# Patient Record
Sex: Male | Born: 1955 | Race: White | Hispanic: No | Smoking: Current some day smoker
Health system: Southern US, Community
[De-identification: ages and names within clinical notes are randomized; demographics above are authoritative.]

## PROBLEM LIST (undated history)

## (undated) DIAGNOSIS — D649 Anemia, unspecified: Secondary | ICD-10-CM

## (undated) DIAGNOSIS — F419 Anxiety disorder, unspecified: Secondary | ICD-10-CM

## (undated) DIAGNOSIS — D126 Benign neoplasm of colon, unspecified: Secondary | ICD-10-CM

## (undated) DIAGNOSIS — K56609 Unspecified intestinal obstruction, unspecified as to partial versus complete obstruction: Secondary | ICD-10-CM

## (undated) DIAGNOSIS — K5730 Diverticulosis of large intestine without perforation or abscess without bleeding: Secondary | ICD-10-CM

## (undated) DIAGNOSIS — K632 Fistula of intestine: Secondary | ICD-10-CM

## (undated) DIAGNOSIS — F329 Major depressive disorder, single episode, unspecified: Secondary | ICD-10-CM

## (undated) DIAGNOSIS — F32A Depression, unspecified: Secondary | ICD-10-CM

## (undated) DIAGNOSIS — L409 Psoriasis, unspecified: Secondary | ICD-10-CM

## (undated) DIAGNOSIS — M1990 Unspecified osteoarthritis, unspecified site: Secondary | ICD-10-CM

## (undated) DIAGNOSIS — K5080 Crohn's disease of both small and large intestine without complications: Principal | ICD-10-CM

## (undated) DIAGNOSIS — J449 Chronic obstructive pulmonary disease, unspecified: Secondary | ICD-10-CM

## (undated) DIAGNOSIS — K219 Gastro-esophageal reflux disease without esophagitis: Secondary | ICD-10-CM

## (undated) HISTORY — DX: Psoriasis, unspecified: L40.9

## (undated) HISTORY — DX: Anxiety disorder, unspecified: F41.9

## (undated) HISTORY — DX: Depression, unspecified: F32.A

## (undated) HISTORY — DX: Anemia, unspecified: D64.9

## (undated) HISTORY — DX: Chronic obstructive pulmonary disease, unspecified: J44.9

## (undated) HISTORY — DX: Crohn's disease of both small and large intestine without complications: K50.80

## (undated) HISTORY — PX: INCISE AND DRAIN ABCESS: PRO64

## (undated) HISTORY — PX: VASECTOMY: SHX75

## (undated) HISTORY — DX: Diverticulosis of large intestine without perforation or abscess without bleeding: K57.30

## (undated) HISTORY — DX: Major depressive disorder, single episode, unspecified: F32.9

## (undated) HISTORY — PX: COLONOSCOPY W/ BIOPSIES: SHX1374

## (undated) HISTORY — DX: Unspecified osteoarthritis, unspecified site: M19.90

## (undated) HISTORY — DX: Gastro-esophageal reflux disease without esophagitis: K21.9

## (undated) HISTORY — DX: Unspecified intestinal obstruction, unspecified as to partial versus complete obstruction: K56.609

## (undated) HISTORY — PX: OTHER SURGICAL HISTORY: SHX169

## (undated) HISTORY — DX: Fistula of intestine: K63.2

## (undated) HISTORY — PX: INGUINAL HERNIA REPAIR: SUR1180

---

## 1977-11-29 HISTORY — PX: APPENDECTOMY: SHX54

## 1998-08-07 ENCOUNTER — Ambulatory Visit (HOSPITAL_BASED_OUTPATIENT_CLINIC_OR_DEPARTMENT_OTHER): Admission: RE | Admit: 1998-08-07 | Discharge: 1998-08-07 | Payer: Self-pay | Admitting: Urology

## 2005-04-29 DIAGNOSIS — D126 Benign neoplasm of colon, unspecified: Secondary | ICD-10-CM

## 2005-04-29 HISTORY — DX: Benign neoplasm of colon, unspecified: D12.6

## 2005-05-10 ENCOUNTER — Ambulatory Visit: Payer: Self-pay | Admitting: Internal Medicine

## 2005-05-20 ENCOUNTER — Ambulatory Visit: Payer: Self-pay | Admitting: Internal Medicine

## 2005-05-20 ENCOUNTER — Encounter (INDEPENDENT_AMBULATORY_CARE_PROVIDER_SITE_OTHER): Payer: Self-pay | Admitting: *Deleted

## 2009-02-25 ENCOUNTER — Encounter: Payer: Self-pay | Admitting: *Deleted

## 2009-02-25 ENCOUNTER — Telehealth (INDEPENDENT_AMBULATORY_CARE_PROVIDER_SITE_OTHER): Payer: Self-pay | Admitting: *Deleted

## 2009-02-26 ENCOUNTER — Ambulatory Visit: Payer: Self-pay

## 2009-02-26 ENCOUNTER — Encounter: Payer: Self-pay | Admitting: Cardiology

## 2010-04-29 ENCOUNTER — Encounter (INDEPENDENT_AMBULATORY_CARE_PROVIDER_SITE_OTHER): Payer: Self-pay | Admitting: *Deleted

## 2010-12-31 NOTE — Letter (Signed)
Summary: Colonoscopy Letter  [facility]  [address] [place], [place] [address]   Phone: [phone]  Fax: [phone]      April 29, 2010 MRN: [id number]   Samuel Montgomery [address] [place], [place]  [address]   Dear Mr. Statzer,   According to your medical record, it is time for you to schedule a Colonoscopy. The American Cancer Society recommends this procedure as a method to detect early colon cancer. Patients with a family history of colon cancer, or a personal history of colon polyps or inflammatory bowel disease are at increased risk.  This letter has beeen generated based on the recommendations made at the time of your procedure. If you feel that in your particular situation this may no longer apply, please contact our office.  Please call our office at [phone] to schedule this appointment or to update your records at your earliest convenience.  Thank you for cooperating with [place] to provide you with the very best care possible.   Sincerely,  Iva Boop, M.D.  [facility] [phone]

## 2011-12-10 ENCOUNTER — Encounter: Payer: Self-pay | Admitting: *Deleted

## 2011-12-10 DIAGNOSIS — K219 Gastro-esophageal reflux disease without esophagitis: Secondary | ICD-10-CM

## 2011-12-10 DIAGNOSIS — L409 Psoriasis, unspecified: Secondary | ICD-10-CM | POA: Insufficient documentation

## 2011-12-20 ENCOUNTER — Telehealth: Payer: Self-pay | Admitting: Gastroenterology

## 2011-12-20 NOTE — Telephone Encounter (Signed)
Pt called in, he is schedule for his yearly physical on 01/04/12 and will call back to schedule colonoscopy after that appt

## 2011-12-20 NOTE — Telephone Encounter (Signed)
Noted  

## 2011-12-20 NOTE — Telephone Encounter (Signed)
Left a message on voicemail for the patient to call me back.

## 2012-01-04 ENCOUNTER — Ambulatory Visit (INDEPENDENT_AMBULATORY_CARE_PROVIDER_SITE_OTHER): Payer: BC Managed Care – PPO | Admitting: Emergency Medicine

## 2012-01-04 ENCOUNTER — Encounter: Payer: Self-pay | Admitting: Emergency Medicine

## 2012-01-04 ENCOUNTER — Ambulatory Visit: Payer: BC Managed Care – PPO

## 2012-01-04 VITALS — BP 117/69 | HR 62 | Temp 98.5°F | Resp 16 | Ht 64.0 in | Wt 157.8 lb

## 2012-01-04 DIAGNOSIS — R222 Localized swelling, mass and lump, trunk: Secondary | ICD-10-CM

## 2012-01-04 DIAGNOSIS — G2581 Restless legs syndrome: Secondary | ICD-10-CM

## 2012-01-04 DIAGNOSIS — K603 Anal fistula: Secondary | ICD-10-CM

## 2012-01-04 DIAGNOSIS — Z0000 Encounter for general adult medical examination without abnormal findings: Secondary | ICD-10-CM

## 2012-01-04 LAB — POCT URINALYSIS DIPSTICK
Bilirubin, UA: NEGATIVE
Glucose, UA: NEGATIVE
Leukocytes, UA: NEGATIVE
Nitrite, UA: NEGATIVE
pH, UA: 5.5

## 2012-01-04 LAB — IFOBT (OCCULT BLOOD): IFOBT: POSITIVE

## 2012-01-04 LAB — TSH: TSH: 0.945 u[IU]/mL (ref 0.350–4.500)

## 2012-01-04 NOTE — Progress Notes (Signed)
  Subjective:    Patient ID: Samuel Montgomery, male    DOB: 10-Oct-1956, 56 y.o.   MRN: [id number]  HPI patient enters for his yearly physical exam. He overall has been doing well he is currently working at the [employer] in [employer]. He continues to smoke at least a pack a day he continues to drink 3-4 drinks at night. He really is not interested in stopping his cigarettes. He also is not interested in cutting back on his alcohol intake.    Review of Systems he's been bothered with chronic cough and no other complaints      Objective:   Physical Exam HEENT exam was normal. Chest was clear to auscultation and percussion. Heart regular rate and rhythm without murmurs. 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 His prostate is normal to exam. 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  [facility] reading (PRIMARY) by  Dr.daub. Chest x-ray shows a prominent right hilar area. There is a suspicion of a mass in this area. EKG shows normal sinus rhythm       Assessment & Plan:  I am very concerned about the abnormal area seen on chest x-ray. We'll proceed with a CT with contrast to rule out a mass in this area. The patient is high risk due to his smoking history. 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 Patient is not concerning decreasing his alcohol use or cigarette use at this time.

## 2012-01-04 NOTE — Patient Instructions (Addendum)
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 Injections, How and Where to Give, Child - TO - How and Where to Give Insulin Injections, Child Intertrigo-Brief - TO - Intertrigo Jaw Dislocation (Mandibular Dislocation) - TO - Jaw Dislocation Knee Pain, General - TO - Knee Pain Knee Replacement - TO - Knee Replacement, Total Knee Replacement, Care After - TO - Knee Replacement, Total, Care After Laceration Care - TO - Laceration Care, Adult Laceration Care, Easy-to-Read - TO - Laceration Care, Adult, Easy-to-Read Laceration Care, Inside Mouth - TO - Mouth Laceration Laceration Care, Inside Mouth, Easy-to-Read - TO -  Mouth Laceration, Easy-to-Read LEEP (Loop Electrosurgical Excision Procedure) - TO - Loop Electrosurgical Excision Procedure  Lexiscan - TO - Lexiscan Stress Electrocardiography Ligamentous Sprain - TO - Ligament Sprain Marine - Coelenterate Stings - TO - Coelenterate Sting Marine - Sea Urchin Injuries - TO - Sea Urchin Injury Melanoma (Skin Cancer) - TO - Melanoma Menorrhagia (Heavy Periods) - TO - Menorrhagia Mesenteric Ischemia - TO - Acute Mesenteric Ischemia Mononucleosis, Infectious - TO - Infectious Mononucleosis Mononucleosis, Infectious, Easy-to-Read - TO - Infectious Mononucleosis, Easy-to-Read MRSA Infection in Pregnancy - TO - MRSA Infection During Pregnancy Myelogram, Care After, Easy-to-Read - TO - Myelography, Care After, Easy-to-Read Myelogram, Easy-to-Read - TO - Myelography, Easy-to-Read Myelogram, Neuro-Radiology - TO - Myelography Nasal Foreign Body-Brief - TO - Nasal Foreign Body, Easy-to-Read Nuclear Dobutamine Stress Test - TO - Dobutamine Stress Electrocardiographyew Title Oligohydraminos - TO - Oligohydramnios Organ Ultrasound - TO - Abdominal or Pelvic Ultrasound Organ Ultrasound, Easy-to-Read - TO - Abdominal or Pelvic Ultrasound, Easy-to-Read Pain Medications, Generic Instructions For - TO - Pain Medicine Instructions Pain Medications, Generic Instructions For, Easy-to-Read - TO - Pain Medicine Instructions, Easy-to-Read Patent Ductus Arteriosus (PDA) - TO - Patent Ductus Arteriosus Pediatric IV's - TO - Intravenous Catheter, Pediatric Phenylketonuria (PKU) - TO - Phenylketonuria Plasmapheresis, Blood Cleansing - TO - Plasmapheresis, Adult Polycystic Ovarian Syndrome (PCOS) - TO - Polycystic Ovarian Syndrome Polyps, Colon - TO - Colon Polyps Post-Concussion Syndrome, Adult - TO - Post-Concussion Syndrome Post-Concussion Syndrome-Brief - TO - Post-Concussion Syndrome, Easy-to-Read Postpartum Tubal Ligation (PPTL) - TO - Postpartum Tubal Ligation Precautions,  Airborne, Easy-to-Read - TO - Airborne Precautions, Easy-to-Read Precautions, Contact, Easy-to-Read - TO - Contact Precautions, Easy-to-Read Precautions, Droplet, Easy-to-Read - TO - Droplet Precautions, Easy-to-Read Pregnancy - Cytomegalovirus - TO - Cytomegalovirus During Pregnancy Pregnancy - Heartburn - TO - Heartburn During Pregnancy Pregnancy - Heartburn, Easy-to-Read - TO - Heartburn During Pregnancy, Easy-to-Read Premenstrual Syndrome (PMS) - TO - Premenstrual Syndrome   Proctalgia Fugax (Rectal Pain) - TO - Proctalgia Fugax Prolactin Levels PRL's - TO - Prolactin Levels Proteinuria, Protein in the Urine - TO - Proteinuria Pruritic Urticarial Papules and Plaques of Pregnancy (PUPPP) - TO - Pruritic Urticarial Papules and Plaques of Pregnancy Pseudofolliculitis Barbae (Razor Bumps) - TO - Pseudofolliculitis Barbae Psoriasis-Brief - TO - Psoriasis, Easy-to-Read PTSD, Post Traumatic Stress Disorder - TO - Post-Traumatic Stress Disorder Pulmonary Stress Testing - TO - Pulmonary Stress Test Radial Head Fractures, Easy-to-Read - TO - Radial Head Fracture, Easy-to-Read Repetitive Strain Injuries and Trauma Disorders - TO - Repetitive Strain Injuries [place] Fever (RVF) - TO - [place] Fever Sebaceous Cyst-Brief - TO - Epidermal Cyst, Easy-to-Read Sebaceous Cysts - TO - Epidermal Cyst Sepsis - TO - Sepsis, Adult Shingles (Herpes Zoster) - TO - Shingles Spider Bite-Brief - TO - Spider Bite Stress Echocardiogram - TO - Exercise Stress Echocardiography Stress Test, Cardiovascular - TO - Exercise Stress Electrocardiography Stroke, Hemorrhagic - TO - Hemorrhagic Stroke Sub Q Shot From a Bottle, Easy-to-Read - TO - Subcutaneous Injection Using a Syringe and Vial Sub Q Shot Syringe, Easy-to-Read - TO - Subcutaneous Injection Using a Syringe Tethered Spinal Cord Syndrome - TO - Tethered Cord Syndrome Ticks (Deer Tick Bites) - TO - Deer Tick Bite Ticks (Wood Tick Bites) - TO - Wood Tick  Bite Ticks (Wood Tick Bites), Easy-to-Read - TO - Wood Tick Bite, Easy-to-Read Toe Dislocation, Care After - TO - Toe Dislocation Toxocariasis, Roundworm Infection - TO - Toxocariasis Transesophageal Echocardiogram - TO - Transesophageal Echocardiography Vulva Biopsy, Easy-to-Read - TO - Vulva Biopsy, Care After, Easy-to-Read Why You Should Seek Dental Care - TO - Dental Care and Dentist Visits DEACTIVATED TITLES (77) Alcohol Intoxication-Brief ALT, Alanine Aminotransferase Anal Pruritus-Brief Anemia, Iron Deficiency-Brief Appendectomy, Child Appendectomy, Child, Care After Avoiding Insect Bites Biopsy, Discharge Instructions for Bruise, Easy-to-Read Cat Bite, Easy-to-Read Cold Therapy Cold, Common, Adult Cold, Common, Adult, Easy-to-Read Cold, Common, Child Cold, Common, Child, Easy-to-Read Colds and Flu, What to do Contraction Stress Testing Contusions Cough, Bacterial Cough, Generic Cough, Generic, Easy-to-Read Cough, Viral Cough-Brief Cumulative Trauma Disorder Dental Avulsion Diet - Low Cholesterol Guidelines Diet - Low-Fat, Low Saturated Fat, Low Cholesterol Dilation and Curettage, Diagnostic, Care After Dilation or Vacuum Curettage, Miscarriage or Retained Placenta, Care After Dog Bite Dog Bite, Easy-to-Read DT Immunization Dysmenorrhea-Brief Electrocardiography Test Electrocardiography-Brief Electroencephalography Test Electrophysiologic Study Epilepsy-Brief Fetal Contraction Stress Test Fetal Nonstress Test Food Allergies-Brief Hand, Foot, and Mouth Disease-Brief Hypertension in Pregnancy-Brief Hyponatremia-Brief Hysterectomy, Abdominal and Vaginal, Care After Insect Bite or Sting, Infected Insect Sting Allergies Intraoral Laceration-Brief Irukandji Sting Laceration, After Care Low Cholesterol, Low Fat Diet, Easy-to-Read Myelography Neutropenia From Chemotherapy Neutropenia, Causes of Neutropenic Precautions Nonhuman Primate Bite Pain Control  After Surgery, Easy-to-Read Pain, Taking Care of Your Pain at Home, Easy-to-Read Parotitis-Brief Postsurgical Instructions, General, Adult Pregnancy - Nonstress Testing Rectal Bleeding-Brief Repetitive Motion Disorders Safety, Easy-to-Read Scarlet Fever-Brief Shin Splints-Brief Sunburn-Brief Surgical Instructions, Generic Tailbone Injury-Brief Tetanus and Diphtheria Prevention-Brief Tetanus Prevention (Tetanus Toxoid) Tuberculosis-Brief Tularemia, FAQs Tul[place]mia, General Information, Bioterrorism Upper GI and Esophagus Studies Vertigo (Dizziness)-Brief Wound Recheck with Infection Document Released: 08/03/2011 Document Revised: 08/03/2011 Document Reviewed: 08/03/2011 ExitCare Patient Information 2012 [employer]. 

## 2012-01-05 LAB — COMPREHENSIVE METABOLIC PANEL
Alkaline Phosphatase: 89 U/L (ref 39–117)
CO2: 22 mEq/L (ref 19–32)
Creat: 0.91 mg/dL (ref 0.50–1.35)
Glucose, Bld: 76 mg/dL (ref 70–99)
Sodium: 139 mEq/L (ref 135–145)
Total Bilirubin: 0.6 mg/dL (ref 0.3–1.2)
Total Protein: 6.2 g/dL (ref 6.0–8.3)

## 2012-01-05 LAB — CBC
Hemoglobin: 15.1 g/dL (ref 13.0–17.0)
MCH: 30.3 pg (ref 26.0–34.0)
MCV: 91.4 fL (ref 78.0–100.0)
RBC: 4.99 MIL/uL (ref 4.22–5.81)
WBC: 10.9 10*3/uL — ABNORMAL HIGH (ref 4.0–10.5)

## 2012-01-05 LAB — LIPID PANEL
Cholesterol: 149 mg/dL (ref 0–200)
HDL: 55 mg/dL (ref >39)
Total CHOL/HDL Ratio: 2.7 Ratio
VLDL: 12 mg/dL (ref 0–40)

## 2012-01-05 LAB — TESTOSTERONE, FREE, TOTAL, SHBG
Sex Hormone Binding: 41 nmol/L (ref 13–71)
Testosterone, Free: 54.3 pg/mL (ref 47.0–244.0)
Testosterone-% Free: 1.7 % (ref 1.6–2.9)
Testosterone: 314.66 ng/dL (ref 250–890)

## 2012-01-17 ENCOUNTER — Other Ambulatory Visit: Payer: BC Managed Care – PPO

## 2012-01-31 ENCOUNTER — Encounter: Payer: Self-pay | Admitting: Internal Medicine

## 2012-03-10 ENCOUNTER — Encounter: Payer: Self-pay | Admitting: *Deleted

## 2012-03-14 ENCOUNTER — Encounter: Payer: Self-pay | Admitting: Internal Medicine

## 2012-03-14 ENCOUNTER — Ambulatory Visit (INDEPENDENT_AMBULATORY_CARE_PROVIDER_SITE_OTHER): Payer: BC Managed Care – PPO | Admitting: Internal Medicine

## 2012-03-14 VITALS — BP 124/70 | HR 68 | Ht 64.0 in | Wt 159.4 lb

## 2012-03-14 DIAGNOSIS — Z86010 Personal history of colonic polyps: Secondary | ICD-10-CM

## 2012-03-14 DIAGNOSIS — R933 Abnormal findings on diagnostic imaging of other parts of digestive tract: Secondary | ICD-10-CM

## 2012-03-14 DIAGNOSIS — R197 Diarrhea, unspecified: Secondary | ICD-10-CM

## 2012-03-14 DIAGNOSIS — K529 Noninfective gastroenteritis and colitis, unspecified: Secondary | ICD-10-CM

## 2012-03-14 DIAGNOSIS — Z1211 Encounter for screening for malignant neoplasm of colon: Secondary | ICD-10-CM

## 2012-03-14 DIAGNOSIS — K4020 Bilateral inguinal hernia, without obstruction or gangrene, not specified as recurrent: Secondary | ICD-10-CM

## 2012-03-14 MED ORDER — PEG-KCL-NACL-NASULF-NA ASC-C 100 G PO SOLR: 1.0000 | Freq: Once | ORAL | Status: DC

## 2012-03-14 MED ORDER — LOPERAMIDE HCL 2 MG PO CHEW: CHEWABLE_TABLET | ORAL | Status: DC

## 2012-03-14 NOTE — Progress Notes (Signed)
Subjective:    Patient ID: Samuel Montgomery, male    DOB: 1956-10-24, 56 y.o.   MRN: [id number]  Referred by: Collene Gobble, MD [address] [place], [place] [address]  HPI This 56 year old white man presents with complaints of diarrhea and concerns that he has an anal fistula. For a year or more he has had watery stools averaging 6-8 a day. Occasionally formed almost always watery. He is noted an opening which drained some clear fluid, in the anal area. He is used to me her for inspection. He can feel something there as well. He has urgent stools, and has had occasional fecal incontinence at home. He has not any bleeding. The anal and rectal area is tender and sore. He has not tried any over-the-counter medications to try to treat this diarrhea. He had a colonoscopy with adenomatous rectal polyp in 2006. There was a suspected polyp in the sigmoid but the pathology was consistent with postal prolapse. He also had diverticulosis. He has not yet returned for a followup routine colonoscopy.  He believes he has lost some weight he denies fever. He has not had nausea or vomiting.  He drinks 5-6 bourbon drinks nightly. He says this helps him sleep. He has been doing this for a number of years. 3-4 caffeinated beverages daily. Marijuana use approximately once a month.  Medications, allergies, past medical history, past surgical history, family history and social history are reviewed and updated in the EMR.  Review of Systems Chest x-ray has suggested right hilar fullness and a CT of the chest was recommended and ordered. The patient has not performed the chest CT and says he is tried to contact his primary care office about this. He believes that he has had this problem in the past. He was hoping for x-ray review. He does have psoriasis and patchy areas of itching and rash. All other review of systems negative or as per history of present illness.    Objective:   Physical Exam General:  Well-developed,  well-nourished and in no acute distress Eyes:  anicteric. ENT:   Mouth and posterior pharynx free of lesions.  Neck:   supple w/o thyromegaly or mass.  Lungs: Clear to auscultation bilaterally. Heart:  S1S2, no rubs, murmurs, gallops. Abdomen:  soft, non-tender, no hepatosplenomegaly or mass, + bilateral inguinal herniae, BS+.  Rectal: Pinpoint opening right perianal area with underlying induration. Nontender. Nothing expressedPerianal erythema also. No rectal mass, soft brown stool. Lymph:  no cervical or supraclavicular adenopathy. Extremities:   no edema Skin   scattered areas psoriatic plaques Neuro:  A&O x 3.  Psych:  appropriate mood and  Affect.   Data Reviewed:  iFOBT + 12/2011 Lab Results  Component Value Date   WBC 10.9* 01/04/2012   HGB 15.1 01/04/2012   HCT 45.6 01/04/2012   MCV 91.4 01/04/2012   PLT 307 01/04/2012     Chemistry      Component Value Date/Time   NA 139 01/04/2012 1230   K 4.2 01/04/2012 1230   CL 107 01/04/2012 1230   CO2 22 01/04/2012 1230   BUN 12 01/04/2012 1230   CREATININE 0.91 01/04/2012 1230      Component Value Date/Time   CALCIUM 8.6 01/04/2012 1230   ALKPHOS 89 01/04/2012 1230   AST 18 01/04/2012 1230   ALT 21 01/04/2012 1230   BILITOT 0.6 01/04/2012 1230            Assessment & Plan:   1. Chronic diarrhea  2. Special screening for malignant neoplasms, colon   3. Personal history of colonic polyps   4.  ? Anal fistula or perianal abscess  5. Bilateral inguinal hernia     His current problems are suggestive of inflammatory bowel disease. Risk factors for this include family history sister, she has Crohn's disease and he also has psoriasis which I believe increases the risk of inflammatory bowel disease also. Colonoscopy is appropriate to investigate this. He can use Imodium as needed for the diarrhea for the time being. I recommended he reduce the amount of alcohol he drinks as that may contribute to his diarrhea as well there think he may have a small  perianal abscess versus fistula. Further imaging may be necessary for that though await the colonoscopy results first.The risks and benefits as well as alternatives of endoscopic procedure(s) have been discussed and reviewed. All questions answered. The patient agrees to proceed.  I recommended he follow up with primary care regarding the abnormal chest x-ray as well I doubt that related to this issue.  Cc: Lesle Chris, MD

## 2012-03-14 NOTE — Patient Instructions (Addendum)
You have been given a separate informational sheet regarding your tobacco use, the importance of quitting and local resources to help you quit. Please reduce alcohol consumption to no more than 2 drinks a day You have been scheduled for a colonoscopy.Please follow written instructions given to you at your visit today.  Please pick up your prep kit at the pharmacy within the next 1-3 days. Please pick up Imodium AD, it is over the counter.  Take one tablet four times a day as needed for diarrhea.

## 2012-03-15 ENCOUNTER — Encounter: Payer: Self-pay | Admitting: Internal Medicine

## 2012-03-30 ENCOUNTER — Ambulatory Visit (AMBULATORY_SURGERY_CENTER): Payer: BC Managed Care – PPO | Admitting: Internal Medicine

## 2012-03-30 ENCOUNTER — Encounter: Payer: Self-pay | Admitting: Internal Medicine

## 2012-03-30 VITALS — BP 117/70 | HR 60 | Temp 96.8°F | Resp 21 | Ht 64.0 in | Wt 159.0 lb

## 2012-03-30 DIAGNOSIS — Z860100 Personal history of colon polyps, unspecified: Secondary | ICD-10-CM

## 2012-03-30 DIAGNOSIS — K633 Ulcer of intestine: Secondary | ICD-10-CM

## 2012-03-30 DIAGNOSIS — Z1211 Encounter for screening for malignant neoplasm of colon: Secondary | ICD-10-CM

## 2012-03-30 DIAGNOSIS — K5730 Diverticulosis of large intestine without perforation or abscess without bleeding: Secondary | ICD-10-CM

## 2012-03-30 MED ORDER — SODIUM CHLORIDE 0.9 % IV SOLN: 500.0000 mL | INTRAVENOUS | Status: DC

## 2012-03-30 NOTE — Patient Instructions (Addendum)

## 2012-03-30 NOTE — Progress Notes (Signed)
Patient did not experience any of the following events: a burn prior to discharge; a fall within the facility; wrong site/side/patient/procedure/implant event; or a hospital transfer or hospital admission upon discharge from the facility. (G8907) Patient did not have preoperative order for IV antibiotic SSI prophylaxis. (G8918)  

## 2012-03-30 NOTE — Progress Notes (Signed)
The pt tolerated the colonoscopy very well. Maw   

## 2012-03-30 NOTE — Op Note (Signed)
[facility] [address] [place], [place]  [address]  COLONOSCOPY PROCEDURE REPORT  PATIENT:  Samuel Montgomery, Samuel Montgomery  MR#:  [id number] BIRTHDATE:  Nov 19, 1956, 56 yrs. old  GENDER:  male ENDOSCOPIST:  Iva Boop, MD, [facility]  PROCEDURE DATE:  03/30/2012 PROCEDURE:  Colonoscopy with biopsy ASA CLASS:  Class II INDICATIONS:  Screening, history of pre-cancerous (adenomatous) colon polyps, unexplained diarrhea MEDICATIONS:   These medications were titrated to patient response per physician's verbal order, Fentanyl 50 mcg IV, Versed 6 mg IV  DESCRIPTION OF PROCEDURE:   After the risks benefits and alternatives of the procedure were thoroughly explained, informed consent was obtained.  Digital rectal exam was performed and revealed no rectal masses and normal prostate.  There was a firm perianal nodule as palpated previously (recent office visit), about 2 o'clock in left lateral position. The LB [id number] endoscope was introduced through the anus and advanced to the terminal ileum which was intubated for a short distance, without limitations.  The quality of the prep was excellent, using MoviPrep.  The instrument was then slowly withdrawn as the colon was fully examined. <<PROCEDUREIMAGES>>  FINDINGS:  A stricture was found terminal ileum Ulcerated and stenotic area, a few cm at most from ileocecal valve. Scope would not pass. Multiple biopsies were obtained and sent to pathology. Ulcers in terminal ileum Multiple biopsies were obtained and sent to pathology.  Ulcers throughout the colon. Aphthous ulcers seen scattered throughout the colon, more prominent in the sigmoid colon. Multiple biopsies were obtained and sent to pathology. Moderate diverticulosis was found in the sigmoid colon.  This was otherwise a normal examination of the colon.   Retroflexed views in the rectum revealed no abnormalities.    The time to cecum = 2:27 minutes. The scope was then withdrawn in  12:01 minutes from the cecum and the procedure completed. 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 My office will contact him to do by early next week. 2) Office visit next week so I can discuss diagnosis, further work-up (CT-enterography)  and treatment options. My office will call and arrange. 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 REPEAT EXAM:  In for Colonoscopy, pending biopsy results.  Iva Boop, MD, [facility]  CC:  Lesle Chris, MD and The Patient  n. eSIGNED:   Iva Boop at 03/30/2012 05:04 PM  Daphene Jaeger, [id number]

## 2012-03-31 ENCOUNTER — Telehealth: Payer: Self-pay

## 2012-03-31 ENCOUNTER — Telehealth: Payer: Self-pay | Admitting: *Deleted

## 2012-03-31 NOTE — Telephone Encounter (Signed)
Pt calling states he needs a work note from procedure yesterday. Pt asked for note for yesterday, procedure day and today as well. Told pt we can only give note for yesterday. Pt verbalized undersatnding. ewm

## 2012-03-31 NOTE — Telephone Encounter (Signed)
  Follow up Call-  Call back number 03/30/2012  Post procedure Call Back phone  # [phone]  Permission to leave phone message Yes     Patient questions:  Do you have a fever, pain , or abdominal swelling? no Pain Score  0 *  Have you tolerated food without any problems? yes  Have you been able to return to your normal activities? yes  Do you have any questions about your discharge instructions: Diet   no Medications  no Follow up visit  no  Do you have questions or concerns about your Care? no  Actions: * If pain score is 4 or above: No action needed, pain <4.

## 2012-04-04 NOTE — Progress Notes (Signed)
Quick Note:  Office let him know  Biopsies are consistent with Crohn's Needs labs and PPD as written in colonoscopy note and REV me this week to discuss further evaluation Please arrange these  LEC   No letter  No recall colonoscopy at this time ______

## 2012-04-05 ENCOUNTER — Ambulatory Visit (INDEPENDENT_AMBULATORY_CARE_PROVIDER_SITE_OTHER): Payer: BC Managed Care – PPO | Admitting: Internal Medicine

## 2012-04-05 ENCOUNTER — Other Ambulatory Visit: Payer: Self-pay

## 2012-04-05 ENCOUNTER — Other Ambulatory Visit (INDEPENDENT_AMBULATORY_CARE_PROVIDER_SITE_OTHER): Payer: BC Managed Care – PPO

## 2012-04-05 DIAGNOSIS — K5090 Crohn's disease, unspecified, without complications: Secondary | ICD-10-CM

## 2012-04-05 DIAGNOSIS — K5080 Crohn's disease of both small and large intestine without complications: Secondary | ICD-10-CM | POA: Insufficient documentation

## 2012-04-05 HISTORY — DX: Crohn's disease of both small and large intestine without complications: K50.80

## 2012-04-05 LAB — HEPATITIS B SURFACE ANTIBODY,QUALITATIVE: Hep B S Ab: NEGATIVE

## 2012-04-05 LAB — CBC WITH DIFFERENTIAL/PLATELET
Basophils Relative: 0.5 % (ref 0.0–3.0)
Eosinophils Relative: 3 % (ref 0.0–5.0)
HCT: 44 % (ref 39.0–52.0)
Hemoglobin: 15 g/dL (ref 13.0–17.0)
Lymphs Abs: 1.8 10*3/uL (ref 0.7–4.0)
Monocytes Relative: 6.9 % (ref 3.0–12.0)
Neutro Abs: 8.7 10*3/uL — ABNORMAL HIGH (ref 1.4–7.7)
WBC: 11.7 10*3/uL — ABNORMAL HIGH (ref 4.5–10.5)

## 2012-04-05 LAB — COMPREHENSIVE METABOLIC PANEL
BUN: 19 mg/dL (ref 6–23)
CO2: 25 mEq/L (ref 19–32)
Creatinine, Ser: 1.2 mg/dL (ref 0.4–1.5)
GFR: 69.62 mL/min (ref >60.00)
Glucose, Bld: 89 mg/dL (ref 70–99)
Total Bilirubin: 0.6 mg/dL (ref 0.3–1.2)
Total Protein: 6.6 g/dL (ref 6.0–8.3)

## 2012-04-07 ENCOUNTER — Other Ambulatory Visit (INDEPENDENT_AMBULATORY_CARE_PROVIDER_SITE_OTHER): Payer: BC Managed Care – PPO

## 2012-04-07 ENCOUNTER — Ambulatory Visit (INDEPENDENT_AMBULATORY_CARE_PROVIDER_SITE_OTHER): Payer: BC Managed Care – PPO | Admitting: Internal Medicine

## 2012-04-07 VITALS — BP 122/74 | HR 68 | Ht 64.0 in | Wt 159.6 lb

## 2012-04-07 DIAGNOSIS — K5080 Crohn's disease of both small and large intestine without complications: Secondary | ICD-10-CM

## 2012-04-07 DIAGNOSIS — R918 Other nonspecific abnormal finding of lung field: Secondary | ICD-10-CM

## 2012-04-07 DIAGNOSIS — L409 Psoriasis, unspecified: Secondary | ICD-10-CM

## 2012-04-07 DIAGNOSIS — R9389 Abnormal findings on diagnostic imaging of other specified body structures: Secondary | ICD-10-CM

## 2012-04-07 DIAGNOSIS — L408 Other psoriasis: Secondary | ICD-10-CM

## 2012-04-07 LAB — TB SKIN TEST

## 2012-04-07 NOTE — Patient Instructions (Addendum)
You have been given a separate informational sheet regarding your tobacco use, the importance of quitting and local resources to help you quit.  You have been given a one year complimentary [employer] membership prescription.  You have been given a low fiber diet handout today.  Your physician has requested that you go to the basement for the following lab work before leaving today: B-12 level  You have been scheduled for a CT enterography and CT chest with constrast  at [facility] ([address]---this is in the same building as Architectural technologist).   You are scheduled on 04/11/12 at 3pm. You should arrive at 1:45pm for registration. Please follow the written instructions below on the day of your exam:  WARNING: IF YOU ARE ALLERGIC TO IODINE/X-RAY DYE, PLEASE NOTIFY RADIOLOGY IMMEDIATELY AT [phone]! YOU WILL BE GIVEN A 13 HOUR PREMEDICATION PREP.   Do not eat or drink anything after 11am(4 hours prior to your test)  The purpose of you drinking the oral contrast is to aid in the visualization of your intestinal tract. The contrast solution may cause some diarrhea. Before your exam is started, you will be given a small amount of fluid to drink. Depending on your individual set of symptoms, you may also receive an intravenous injection of x-ray contrast/dye. Plan on being at [facility] for 30 minutes or long, depending on the type of exam you are having performed.  If you have any questions regarding your exam or if you need to reschedule, you may call the CT department at [phone] .

## 2012-04-08 NOTE — Progress Notes (Signed)
Patient ID: Samuel Montgomery, male   DOB: [birth year], 56 y.o.   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 Reports he feels well on Imodium AD. He has questions as to whether he really needs disease therapy at this time. He does have a sister who is quite ill due to crohn's. His mother is here and participates.  He told me that he has problems with supporting the [employer] and take their products.  He reports reading extensively on-line.  We spent 30 minutes discussing and reviewing his situation.   1. Crohn's ileocolitis   2. Abnormal chest x-ray feb 2013 - abnormal right hilum and old granulomatous disease  3. Psoriasis  He will undergo Chest CT (evaluate abnormal hilum and granulomatous disease) and CT-enterography (Crohn's staging) His PPD was negative. ? Implications of granulomatous disease on CXR and biologic therapy. Need to sort out. It sounds like he is probably willing to take Humira. This can also help his psoriasis. I have explained risks of infections and malignancy and he will read CCFA information also. His Crohn's may not be that symptomatic but I think would be best treated aggressively with biologic therapy to reduce risk of future complications and surgery. We did discuss option of immunomodulator therapy also - he and I both think biologic therapy makes the most sense if feasible though he still needs to work through his issues re: "greedy Physicist, medical".  We will regroup after imaging, also check B12 evel today.  WU:JWJX, Stan Head, MD

## 2012-04-09 LAB — VITAMIN D 1,25 DIHYDROXY: Vitamin D2 1, 25 (OH)2: <8 pg/mL

## 2012-04-11 ENCOUNTER — Ambulatory Visit (INDEPENDENT_AMBULATORY_CARE_PROVIDER_SITE_OTHER)
Admission: RE | Admit: 2012-04-11 | Discharge: 2012-04-11 | Disposition: A | Discharge status: Home / Self Care | Payer: BC Managed Care – PPO | Source: Ambulatory Visit | Attending: Internal Medicine | Admitting: Internal Medicine

## 2012-04-11 DIAGNOSIS — R9389 Abnormal findings on diagnostic imaging of other specified body structures: Secondary | ICD-10-CM

## 2012-04-11 DIAGNOSIS — K5080 Crohn's disease of both small and large intestine without complications: Secondary | ICD-10-CM

## 2012-04-11 DIAGNOSIS — R918 Other nonspecific abnormal finding of lung field: Secondary | ICD-10-CM

## 2012-04-11 MED ORDER — IOHEXOL 300 MG/ML  SOLN
100.0000 mL | Freq: Once | INTRAMUSCULAR | Status: AC | PRN
  Administered 2012-04-11: 100 mL via INTRAVENOUS

## 2012-04-17 NOTE — Progress Notes (Signed)
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

## 2012-04-18 ENCOUNTER — Telehealth: Payer: Self-pay

## 2012-04-18 ENCOUNTER — Telehealth: Payer: Self-pay | Admitting: *Deleted

## 2012-04-18 DIAGNOSIS — J8410 Pulmonary fibrosis, unspecified: Secondary | ICD-10-CM

## 2012-04-18 NOTE — Telephone Encounter (Signed)
Notes and telephone encounter faxed to Dr Lattie Haw office

## 2012-04-18 NOTE — Telephone Encounter (Signed)
Message copied by Annett Fabian on Tue Apr 18, 2012  2:15 PM ------      Message from: Iva Boop      Created: Tue Apr 18, 2012 12:01 PM      Regarding: FW: ? other testing before biologics       Need to have Mr. Ehrler seen in ID clinic with Dr. 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 other testing before biologics                     Sounds good thanks Baldo Ash!      ----- Message -----         From: Iva Boop, MD         Sent: 04/18/2012   7:53 AM           To: Randall Hiss, MD      Subject: RE: ? other testing before biologics                     I will send him to you            Thanks      ----- Message -----         From: Randall Hiss, MD         Sent: 04/17/2012   8:37 PM           To: Iva Boop, MD      Subject: RE: ? other testing before biologics                     Baldo Ash, I would check some fungal serologies on him. I certainly worry about risk of reactivation of cocci in him given where he has lived. I  would check coccidioides antibodies by complement fixation, and immune diffusion, and probably check blastomyces antibodies by ID, histoplasma antibodies also by ID. I would be happy to see him in [facility]. If he has positive serologies for one of these dimorphics I would prophylax him with fluconazole (for cocci) or itra for blasto or histo      ----- Message -----         From: Iva Boop, MD         Sent: 04/17/2012   5:22 PM           To: Randall Hiss, MD      Subject: ? other testing before biologics                     Kees,            ? 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            He is PPD negative            Thanks and I can send him as a consult if needed            Baldo Ash

## 2012-04-18 NOTE — Telephone Encounter (Signed)
Sherri from [facility] called to get this man an appt here as a new pt. 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 She states Dr. There has spoken with Dr. Daiva Eves. I told her our manager will call pt & get him seen. I faxed her the referral .most of needed info is in Epic.

## 2012-04-26 ENCOUNTER — Ambulatory Visit: Payer: BC Managed Care – PPO | Admitting: Internal Medicine

## 2012-05-02 ENCOUNTER — Encounter: Payer: Self-pay | Admitting: Internal Medicine

## 2012-05-02 ENCOUNTER — Ambulatory Visit (INDEPENDENT_AMBULATORY_CARE_PROVIDER_SITE_OTHER): Payer: BC Managed Care – PPO | Admitting: Internal Medicine

## 2012-05-02 VITALS — BP 140/78 | HR 72 | Temp 98.8°F | Ht 64.0 in | Wt 159.0 lb

## 2012-05-02 DIAGNOSIS — J8410 Pulmonary fibrosis, unspecified: Secondary | ICD-10-CM

## 2012-05-02 DIAGNOSIS — F17200 Nicotine dependence, unspecified, uncomplicated: Secondary | ICD-10-CM

## 2012-05-02 DIAGNOSIS — F17210 Nicotine dependence, cigarettes, uncomplicated: Secondary | ICD-10-CM | POA: Insufficient documentation

## 2012-05-02 DIAGNOSIS — F1020 Alcohol dependence, uncomplicated: Secondary | ICD-10-CM

## 2012-05-02 NOTE — Progress Notes (Signed)
Patient ID: CALEB DECOCK, male   DOB: July 21, 1956, 56 y.o.   MRN: [id number] Infectious Diseases Initial Consultation         Reason for Consult: Calcified pulmonary granuloma Referring Physician: Dr. Stan Head   Patient Active Problem List  Diagnoses  . Psoriasis  . GERD (gastroesophageal reflux disease)  . Bilateral inguinal hernia  . Personal history of colonic polyps-rectal adenoma  . Crohn's ileocolitis  . Cigarette smoker  . Calcified granuloma of lung  . Alcohol dependence    Patient's Medications  New Prescriptions   No medications on file  Previous Medications   ASPIRIN 81 MG TABLET    Take 81 mg by mouth daily.   LOPERAMIDE HCL 2 MG CHEW    Take one tab two  times a day as needed for diarrhea   MULTIPLE VITAMIN (MULITIVITAMIN WITH MINERALS) TABS    Take 1 tablet by mouth daily.  Modified Medications   No medications on file  Discontinued Medications   No medications on file    Recommendations: 1. Screening for prior infectious disease with coccidiomyces antibody, cryptococcal antigen, interferon gamma release assay for latent tuberculosis, HIV antibody, hepatitis B surface antigen and surface antibody, and hepatitis C antibody   Assessment: Mr. Nimmons has some calcified lymph nodes and a small granuloma in his left upper lobe on a recent CT scan. His 2 negative TB skin test make the probability of latent tuberculosis very low but given the high risk of reactivation tuberculosis in patients on anti-TNF alpha agents I discussed the possibility of doing an interferon gamma release assay for further insurance that he does not have latent tuberculosis. Given his stay in [place] I would also recommend screening for coccidiomyces and Cryptococcus. I do not think that he is at significant risk for histoplasmosis. Guidelines suggest she should also be screened for HIV, hepatitis B and C. He is in agreement with these recommendations. I will call him once I have these results to  give my final recommendations.   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 Stan Head for evaluation of possible prior pulmonary infection leaving residual calcified granuloma. He was recently diagnosed with Crohn's disease. He has had a chronic anal fistula and diarrhea and has a strong family history of Crohn's. He is being considered for therapy with anti-TNF alpha biologic agents. He had a chest x-ray in February which showed a question of a mass and underwent CT scanning last month which showed calcified left hilar and mediastinal nodes and a tiny calcified left upper lobe granuloma. He recalls being told that he has had a stable spot on his lungs for the past 20 years. He has no history of pneumonia but is a chronic smoker.  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 He had a negative TB skin test at that time. He does not know of any direct contact with anyone with active tuberculosis. He recently had a repeat TB skin test which was nonreactive. He was born and raised in [place] but lived briefly in [place] in [place]. He lived in [place] from [phone] and then returned back to [place] and has lived here ever since.  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 He currently has no pets but has had cats, dogs and a cockatiel in the past. He has no foreign travel other than a few brief trips to [place] and [place], [place] while he was living in [place].   Review of Systems: Constitutional: positive for weight loss, negative for chills, fevers and night sweats      Past Medical History  Diagnosis Date  . GERD (gastroesophageal reflux disease)   . Psoriasis   . Benign neoplasm of colon 05/20/2005    adenomatous polyp  . Diverticulosis  of colon (without mention of hemorrhage)   . COPD (chronic obstructive pulmonary disease)   . Colon ulcer     multiple  . Inguinal hernia     bi-lateral    History  Substance Use Topics  . Smoking status: Current Everyday Smoker -- 1.0 packs/day for 40 years    Types: Cigarettes  . Smokeless tobacco: Never Used  . Alcohol Use: 5.0 oz/week    10 drink(s) per week     4-5 drinks daily.    Family History  Problem Relation Age of Onset  . Colon polyps Sister   . Crohn's disease Sister   . Stroke Father   . Colon polyps Mother    Allergies  Allergen Reactions  . Wellbutrin (Bupropion Hcl)     Neg CNS. As of 01/04/2012 patient states he is not allergic to  Wellbutrin.    OBJECTIVE: Blood pressure 140/78, pulse 72, temperature 98.8 F (37.1 C), temperature source Oral, height 5\' 4"  (1.626 m), weight 159 lb (72.122 kg). General: He is talkative and in no distress Skin: No rash or palpable adenopathy Lungs: Clear Cor: Regular S1 and S2 no murmurs Abdomen: Soft and nontender  Cliffton Asters, MD [facility] [place] [facility] [phone] pager   [phone] cell 05/02/2012, 2:30 PM

## 2012-05-03 LAB — CRYPTOCOCCAL ANTIGEN: Crypto Ag: NEGATIVE

## 2012-05-03 LAB — HEPATITIS B SURFACE ANTIGEN: Hepatitis B Surface Ag: NEGATIVE

## 2012-05-03 LAB — HEPATITIS C ANTIBODY: HCV Ab: NEGATIVE

## 2012-05-03 LAB — HIV ANTIBODY (ROUTINE TESTING W REFLEX): HIV: NONREACTIVE

## 2012-05-05 LAB — COCCIDIOIDES ANTIBODIES: Coccidioides Ab CF: <1:2 {titer}

## 2012-05-05 LAB — QUANTIFERON TB GOLD ASSAY (BLOOD): Interferon Gamma Release Assay: NEGATIVE

## 2012-05-07 ENCOUNTER — Telehealth: Payer: Self-pay | Admitting: Internal Medicine

## 2012-05-07 NOTE — Telephone Encounter (Signed)
Pt called to state over the last 4 days he has had worsening of his generalized abd pain.  He reports recent dx of Crohn's disease in the process of deciding with Dr. Leone Payor on therapy. He was and has been having diarrhea, but this is controlled for now with loperamide. No fever.  No vomiting, appetite has been decreased, and he thinks pain has worsened since changing to low-residue diet. The purpose of his call was to see if the process of selecting Crohn's medication could be accelerated.  I will alert Dr. Leone Payor and Darcey Nora to his call.  I let him know he will hear from our office soon and if he worsens overnight, he should seek care in ED.

## 2012-05-08 ENCOUNTER — Encounter: Payer: Self-pay | Admitting: Internal Medicine

## 2012-05-08 ENCOUNTER — Encounter (HOSPITAL_COMMUNITY): Payer: Self-pay

## 2012-05-08 ENCOUNTER — Ambulatory Visit (INDEPENDENT_AMBULATORY_CARE_PROVIDER_SITE_OTHER): Payer: BC Managed Care – PPO | Admitting: Internal Medicine

## 2012-05-08 ENCOUNTER — Telehealth: Payer: Self-pay | Admitting: Internal Medicine

## 2012-05-08 ENCOUNTER — Inpatient Hospital Stay (HOSPITAL_COMMUNITY)
Admission: AD | Admit: 2012-05-08 | Discharge: 2012-05-21 | DRG: 585 | Disposition: A | Discharge status: Home Health | Payer: BC Managed Care – PPO | Source: Ambulatory Visit | Attending: Surgery | Admitting: Surgery

## 2012-05-08 ENCOUNTER — Inpatient Hospital Stay (HOSPITAL_COMMUNITY): Payer: BC Managed Care – PPO

## 2012-05-08 VITALS — BP 90/48 | HR 92 | Ht 64.0 in | Wt 154.8 lb

## 2012-05-08 DIAGNOSIS — K5090 Crohn's disease, unspecified, without complications: Secondary | ICD-10-CM

## 2012-05-08 DIAGNOSIS — K5100 Ulcerative (chronic) pancolitis without complications: Secondary | ICD-10-CM

## 2012-05-08 DIAGNOSIS — K5080 Crohn's disease of both small and large intestine without complications: Principal | ICD-10-CM | POA: Diagnosis present

## 2012-05-08 DIAGNOSIS — R109 Unspecified abdominal pain: Secondary | ICD-10-CM

## 2012-05-08 DIAGNOSIS — F17200 Nicotine dependence, unspecified, uncomplicated: Secondary | ICD-10-CM | POA: Diagnosis present

## 2012-05-08 DIAGNOSIS — E876 Hypokalemia: Secondary | ICD-10-CM | POA: Diagnosis not present

## 2012-05-08 DIAGNOSIS — F1210 Cannabis abuse, uncomplicated: Secondary | ICD-10-CM | POA: Diagnosis present

## 2012-05-08 DIAGNOSIS — K560 Paralytic ileus: Secondary | ICD-10-CM | POA: Diagnosis not present

## 2012-05-08 DIAGNOSIS — K219 Gastro-esophageal reflux disease without esophagitis: Secondary | ICD-10-CM | POA: Diagnosis present

## 2012-05-08 DIAGNOSIS — R1084 Generalized abdominal pain: Secondary | ICD-10-CM

## 2012-05-08 DIAGNOSIS — K631 Perforation of intestine (nontraumatic): Secondary | ICD-10-CM | POA: Diagnosis present

## 2012-05-08 DIAGNOSIS — Z79899 Other long term (current) drug therapy: Secondary | ICD-10-CM

## 2012-05-08 DIAGNOSIS — K651 Peritoneal abscess: Secondary | ICD-10-CM | POA: Diagnosis present

## 2012-05-08 LAB — COMPREHENSIVE METABOLIC PANEL
ALT: 28 U/L (ref 0–53)
AST: 25 U/L (ref 0–37)
Albumin: 2.5 g/dL — ABNORMAL LOW (ref 3.5–5.2)
Alkaline Phosphatase: 79 U/L (ref 39–117)
Calcium: 8.6 mg/dL (ref 8.4–10.5)
Glucose, Bld: 105 mg/dL — ABNORMAL HIGH (ref 70–99)
Potassium: 3.8 mEq/L (ref 3.5–5.1)
Sodium: 132 mEq/L — ABNORMAL LOW (ref 135–145)
Total Protein: 6.4 g/dL (ref 6.0–8.3)

## 2012-05-08 LAB — CBC
HCT: 39.1 % (ref 39.0–52.0)
Hemoglobin: 14 g/dL (ref 13.0–17.0)
MCH: 30.8 pg (ref 26.0–34.0)
MCHC: 35.8 g/dL (ref 30.0–36.0)
MCV: 86.1 fL (ref 78.0–100.0)
RDW: 13.4 % (ref 11.5–15.5)

## 2012-05-08 LAB — DIFFERENTIAL
Basophils Absolute: 0 10*3/uL (ref 0.0–0.1)
Basophils Relative: 0 % (ref 0–1)
Eosinophils Relative: 0 % (ref 0–5)
Monocytes Absolute: 0.9 10*3/uL (ref 0.1–1.0)
Monocytes Relative: 7 % (ref 3–12)
Neutro Abs: 11.7 10*3/uL — ABNORMAL HIGH (ref 1.7–7.7)

## 2012-05-08 MED ORDER — DEXTROSE-NACL 5-0.9 % IV SOLN
INTRAVENOUS | Status: DC
  Administered 2012-05-08 – 2012-05-11 (×4): via INTRAVENOUS

## 2012-05-08 MED ORDER — HYDROMORPHONE HCL PF 1 MG/ML IJ SOLN
1.0000 mg | INTRAMUSCULAR | Status: DC | PRN
Start: 2012-05-08 — End: 2012-05-10
  Administered 2012-05-08 – 2012-05-10 (×12): 1 mg via INTRAVENOUS
  Filled 2012-05-08 (×14): qty 1

## 2012-05-08 MED ORDER — ONDANSETRON HCL 4 MG/2ML IJ SOLN: 4.0000 mg | Freq: Four times a day (QID) | INTRAMUSCULAR | Status: DC | PRN

## 2012-05-08 MED ORDER — METRONIDAZOLE IN NACL 5-0.79 MG/ML-% IV SOLN
500.0000 mg | Freq: Four times a day (QID) | INTRAVENOUS | Status: DC
  Administered 2012-05-08 – 2012-05-14 (×24): 500 mg via INTRAVENOUS
  Filled 2012-05-08 (×25): qty 100

## 2012-05-08 MED ORDER — ONDANSETRON HCL 4 MG PO TABS: 4.0000 mg | ORAL_TABLET | Freq: Four times a day (QID) | ORAL | Status: DC | PRN

## 2012-05-08 MED ORDER — IOHEXOL 300 MG/ML  SOLN
100.0000 mL | Freq: Once | INTRAMUSCULAR | Status: AC | PRN
  Administered 2012-05-08: 100 mL via INTRAVENOUS

## 2012-05-08 MED ORDER — ZOLPIDEM TARTRATE 5 MG PO TABS
5.0000 mg | ORAL_TABLET | Freq: Every evening | ORAL | Status: DC | PRN
  Administered 2012-05-08 – 2012-05-13 (×6): 5 mg via ORAL
  Filled 2012-05-08 (×6): qty 1

## 2012-05-08 MED ORDER — SODIUM CHLORIDE 0.9 % IV BOLUS (SEPSIS)
1000.0000 mL | Freq: Once | INTRAVENOUS | Status: AC
  Administered 2012-05-08: 1000 mL via INTRAVENOUS

## 2012-05-08 MED ORDER — ENOXAPARIN SODIUM 40 MG/0.4ML ~~LOC~~ SOLN
40.0000 mg | SUBCUTANEOUS | Status: DC
  Administered 2012-05-09 – 2012-05-11 (×3): 40 mg via SUBCUTANEOUS
  Filled 2012-05-08 (×7): qty 0.4

## 2012-05-08 MED ORDER — CIPROFLOXACIN IN D5W 400 MG/200ML IV SOLN
400.0000 mg | Freq: Two times a day (BID) | INTRAVENOUS | Status: DC
  Administered 2012-05-08 – 2012-05-14 (×13): 400 mg via INTRAVENOUS
  Filled 2012-05-08 (×12): qty 200

## 2012-05-08 MED ORDER — ALUM & MAG HYDROXIDE-SIMETH 200-200-20 MG/5ML PO SUSP: 30.0000 mL | Freq: Four times a day (QID) | ORAL | Status: DC | PRN

## 2012-05-08 NOTE — Progress Notes (Signed)
Subjective:    Patient ID: Samuel Montgomery, male    DOB: 1956/01/09, 56 y.o.   MRN: [id number]  HPI Patient is a middle-aged white man diagnosed with Crohn's ileocolitis by colonoscopy and CT enterography within the last month. He was having diarrhea but no pain problems. The diarrhea was controlled by loperamide. He had a calcified granuloma on chest CT. This raises questions of late and granulomatous disease so he was seen by infectious disease. A battery of serologic studies recommended all came back negative for tuberculosis, and other fungal infections that might be represented by this lesion. The patient had been concerned about using biologic therapy overall, and was somewhat reluctant to pursue Crohn's disease specific therapy though in general had come around to the idea. The lab tests had just come back in the last couple of days, but prior to that, about 4 days ago he noted increasing abdominal pain and he called [place] over the weekend. He was given an appointment for the off this today.  He is describing right lower quadrant and periumbilical pain that radiates upward. It is intermittent and severe and sharp. It can be worse with movement. His appetite is off. He has been quitting cigarettes, and notes that he had some nausea associated with that, but has not really vomited. He is passing small amounts of flatus and continues to have diarrhea. He has not had a fever. He has not been able to work today. He does not report any rectal bleeding. Allergies  Allergen Reactions  . Wellbutrin (Bupropion Hcl)     Neg CNS. As of 01/04/2012 patient states he is not allergic to  Wellbutrin.   Outpatient Prescriptions Prior to Visit  Medication Sig Dispense Refill  . Loperamide HCl 2 MG CHEW Take one tab two  times a day as needed for diarrhea      . Multiple Vitamin (MULITIVITAMIN WITH MINERALS) TABS Take 1 tablet by mouth daily.      Marland Kitchen aspirin 81 MG tablet Take 81 mg by mouth daily.       Past Medical  History  Diagnosis Date  . GERD (gastroesophageal reflux disease)   . Psoriasis   . Benign neoplasm of colon 05/20/2005    adenomatous polyp  . Diverticulosis of colon (without mention of hemorrhage)   . COPD (chronic obstructive pulmonary disease)   .  Crohn's ileocolitis        . Inguinal hernia     bi-lateral   Past Surgical History  Procedure Date  . Inguinal hernia repair     bilateral   . Appendectomy 1979  . Vasectomy   . Colonoscopy w/ biopsies    History   Social History  . Marital Status: Married    Spouse Name: N/A    Number of Children: 2  . Years of Education: N/A   Occupational History  . [employer]   .     Social History Main Topics  . Smoking status: Current Everyday Smoker -- 1.0 packs/day for 40 years     in the process of quitting  Cigarettes  . Smokeless tobacco: Never Used   Comment: has not had any cigarrettes since Thursday.  . Alcohol Use: 5.0 oz/week    10 drink(s) per week     4-5 drinks daily.  . Drug Use: Yes     per patient uses pot monthly.   Other Topics Concern      Social History Narrative  .  Curator    Family  History  Problem Relation Age of Onset  . Colon polyps Sister     x 2  . Crohn's disease Sister   . Stroke Father   . Colon cancer Neg Hx         Review of Systems As per history of present illness, all other review of systems negative at this time.    Objective:   Physical Exam General:  Well-developed, well-nourished and in acute distress with intermittent abdominal pain. Eyes:  anicteric. ENT:   Mouth and posterior pharynx free of lesions.  Neck:   supple w/o thyromegaly or mass.  Lungs: Clear to auscultation bilaterally. Heart:  S1S2, no rubs, murmurs, gallops. Abdomen:  soft, tender in the right lower quadrant and periumbilical areas. Tender to percussion. Does not seem to have rebound   though he is in pain it is difficult to assess. Bowel sounds are diminished, overall the abdomen is quiet. He has  bilateral   inguinal hernia that are soft.  Lymph:  no cervical or supraclavicular adenopathy, no inguinal adenopathy Extremities:   no edema Skin  psoriasis changes Neuro:  A&O x 3.  Psych:  appropriate mood and  Affect.   Data Reviewed:        Assessment & Plan:

## 2012-05-08 NOTE — Progress Notes (Signed)
CALLED BY RN AT 9:50 PM RE CT RESULTS SHOWING FREE INTRAPERITONEAL AIR. PATIENT NOT KNOWN TO ME PREVIOUS. I REVIEWED CHART AND CT. HAS PROBABLE CROHN'S. ADMITTED BY DR Leone Payor WITH NEW ABDOMINAL PAIN OF SEVERAL DAYS DURATION. HAS ELEVATED WBC WITH LEFT SHIFT. I CALLED GENERAL SURGERY, DR Wenda Low. HE WILL SEE THE PATIENT TONIGHT TO DETERMINE THE NEED FOR SURGERY. THE PATIENT WILL REMAIN ON CIPRO AND FLAGYL AND KEPT NPO. PLANNED SHARED WITH RN, NADINE, AS WELL.

## 2012-05-08 NOTE — Telephone Encounter (Signed)
Spoke with pt ans he will come in today at 3pm.

## 2012-05-08 NOTE — H&P (Signed)
Subjective:   Patient ID: Samuel Montgomery, male DOB: May 31, 1956, 56 y.o. MRN: [id number]  HPI  Patient is a middle-aged white man diagnosed with Crohn's ileocolitis by colonoscopy and CT enterography within the last month. He was having diarrhea but no pain problems. The diarrhea was controlled by loperamide. He had a calcified granuloma on chest CT. This raises questions of late and granulomatous disease so he was seen by infectious disease. A battery of serologic studies recommended all came back negative for tuberculosis, and other fungal infections that might be represented by this lesion. The patient had been concerned about using biologic therapy overall, and was somewhat reluctant to pursue Crohn's disease specific therapy though in general had come around to the idea. The lab tests had just come back in the last couple of days, but prior to that, about 4 days ago he noted increasing abdominal pain and he called [place] over the weekend. He was given an appointment for the off this today.  He is describing right lower quadrant and periumbilical pain that radiates upward. It is intermittent and severe and sharp. It can be worse with movement. His appetite is off. He has been quitting cigarettes, and notes that he had some nausea associated with that, but has not really vomited. He is passing small amounts of flatus and continues to have diarrhea. He has not had a fever. He has not been able to work today. He does not report any rectal bleeding.  Allergies   Allergen  Reactions   .  Wellbutrin (Bupropion Hcl)      Neg CNS. As of 01/04/2012 patient states he is not allergic to  Wellbutrin.    Outpatient Prescriptions Prior to Visit   Medication  Sig  Dispense  Refill   .  Loperamide HCl 2 MG CHEW  Take one tab two times a day as needed for diarrhea     .  Multiple Vitamin (MULITIVITAMIN WITH MINERALS) TABS  Take 1 tablet by mouth daily.     Marland Kitchen  aspirin 81 MG tablet  Take 81 mg by mouth daily.      Past  Medical History   Diagnosis  Date   .  GERD (gastroesophageal reflux disease)    .  Psoriasis    .  Benign neoplasm of colon  05/20/2005     adenomatous polyp   .  Diverticulosis of colon (without mention of hemorrhage)    .  COPD (chronic obstructive pulmonary disease)    .  Crohn's ileocolitis        .  Inguinal hernia      bi-lateral    Past Surgical History   Procedure  Date   .  Inguinal hernia repair      bilateral   .  Appendectomy  1979   .  Vasectomy    .  Colonoscopy w/ biopsies     History    Social History   .  Marital Status:  Married     Spouse Name:  N/A     Number of Children:  2   .  Years of Education:  N/A    Occupational History   .  [employer]    .      Social History Main Topics   .  Smoking status:  Current Everyday Smoker -- 1.0 packs/day for 40 years     in the process of quitting  Cigarettes   .  Smokeless tobacco:  Never Used  Comment: has not had any cigarrettes since Thursday.    .  Alcohol Use:  5.0 oz/week     10 drink(s) per week      4-5 drinks daily.   .  Drug Use:  Yes      per patient uses pot monthly.    Other Topics  Concern       Social History Narrative   .  Curator    Family History   Problem  Relation  Age of Onset   .  Colon polyps  Sister       x 2    .  Crohn's disease  Sister    .  Stroke  Father    .  Colon cancer  Neg Hx     Review of Systems  As per history of present illness, all other review of systems negative at this time.   Objective:   Physical Exam  General: Well-developed, well-nourished and in acute distress with intermittent abdominal pain.  Eyes: anicteric.  ENT: Mouth and posterior pharynx free of lesions.  Neck: supple w/o thyromegaly or mass.  Lungs: Clear to auscultation bilaterally.  Heart: S1S2, no rubs, murmurs, gallops.  Abdomen: soft, tender in the right lower quadrant and periumbilical areas. Tender to percussion. Does not seem to have rebound though he is in pain it is  difficult to assess. Bowel sounds are diminished, overall the abdomen is quiet. He has bilateral inguinal hernia that are soft.  Lymph: no cervical or supraclavicular adenopathy, no inguinal adenopathy  Extremities: no edema  Skin psoriasis changes  Neuro: A&O x 3.  Psych: appropriate mood and Affect.   ASSESSMENT AND PLAN:   1) Crohn's ileocolitis 2) New, severe abdominal pain and tenderness x 4-5 days   Admit, IVF's, IV Abx, CT scan, labs Depending upon CT findings - would start IV steroids Anticipate starting biologic therapy (was planning on Cimzia vs. Humira) for severe Crohn's ileocolitis and perianal nodule.  Iva Boop, MD, [facility] [phone] (pager) 05/08/2012 3:28 PM

## 2012-05-08 NOTE — Consult Note (Signed)
Chief Complaint:  Abdominal pain for 5 days  History of Present Illness:  Samuel Montgomery is an 56 y.o. male measured today by Dr. Leone Payor with abdominal pain and a history of probable inflammatory bowel disease. Patient has a family history with a sister with Crohn's and has had a lot of diarrhea himself. He is suspected that he has had inflammatory bowel disease although he has not been treated for it. He has had a history of alcohol use and problems with psoriasis and has undergone a recent colonoscopy because of history of adenomatous polyps. As a history of smoking with some COPD. He said the current pain began around last Thursday and has been pretty significant. He is passing some flatus although he less than usual. I examined him at approximately 11 PM and he was at that time sleeping comfortably and in no distress. He denies any prior abdominal operations for Crohn's and has only procedure that he mentioned was a prior appendectomy.  I discussed the CT findings with him and told them that it may mean he may need to have an operation or we could see if fluid collection is enough to be aspirated and if he can be followed and see if he responds to his antibiotics. He is currently receiving IV Flagyl and Cipro.  Past Medical History  Diagnosis Date  . GERD (gastroesophageal reflux disease)   . Psoriasis   . Benign neoplasm of colon 05/20/2005    adenomatous polyp  . Diverticulosis of colon (without mention of hemorrhage)   . COPD (chronic obstructive pulmonary disease)   . Inguinal hernia     bi-lateral  . 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 ? Perianal fistula vs. Abscess      Past Surgical History  Procedure Date  . Inguinal hernia repair     bilateral   . Appendectomy 1979  . Vasectomy   . Colonoscopy w/ biopsies     Current Facility-Administered Medications  Medication Dose Route Frequency Provider Last Rate Last Dose  . alum & mag hydroxide-simeth  (MAALOX/MYLANTA) 200-200-20 MG/5ML suspension 30 mL  30 mL Oral Q6H PRN Iva Boop, MD      . ciprofloxacin (CIPRO) IVPB 400 mg  400 mg Intravenous Q12H Iva Boop, MD   400 mg at 05/08/12 1859  . dextrose 5 %-0.9 % sodium chloride infusion   Intravenous Continuous Iva Boop, MD 125 mL/hr at 05/08/12 1718    . enoxaparin (LOVENOX) injection 40 mg  40 mg Subcutaneous Q24H Iva Boop, MD      . HYDROmorphone (DILAUDID) injection 1 mg  1 mg Intravenous Q2H PRN Iva Boop, MD   1 mg at 05/08/12 2209  . iohexol (OMNIPAQUE) 300 MG/ML solution 100 mL  100 mL Intravenous Once PRN Medication Radiologist, MD   100 mL at 05/08/12 2040  . metroNIDAZOLE (FLAGYL) IVPB 500 mg  500 mg Intravenous Q6H Iva Boop, MD   500 mg at 05/08/12 2103  . ondansetron (ZOFRAN) tablet 4 mg  4 mg Oral Q6H PRN Iva Boop, MD       Or  . ondansetron [facility]) injection 4 mg  4 mg Intravenous Q6H PRN Iva Boop, MD      . sodium chloride 0.9 % bolus 1,000 mL  1,000 mL Intravenous Once Iva Boop, MD   1,000 mL at 05/08/12 1603  . zolpidem (AMBIEN) tablet 5 mg  5 mg Oral QHS PRN Baldo Ash  Sena Slate, MD   5 mg at 05/08/12 2103   Wellbutrin Family History  Problem Relation Age of Onset  . Colon polyps Sister     x 2  . Crohn's disease Sister   . Stroke Father   . Colon cancer Neg Hx    Social History:   reports that he has been smoking Cigarettes.  He has a 40 pack-year smoking history. He has never used smokeless tobacco. He reports that he drinks about 5 ounces of alcohol per week. He reports that he uses illicit drugs.   REVIEW OF SYSTEMS - PERTINENT POSITIVES ONLY: Noncontributory  Physical Exam:   Blood pressure 93/60, pulse 76, temperature 98.3 F (36.8 C), temperature source Oral, resp. rate 18, height 5\' 4"  (1.626 m), weight 157 lb (71.215 kg), SpO2 94.00%. Body mass index is 26.95 kg/(m^2).  Gen:  WDWN WHITE male NAD  Neurological: Alert and oriented to person, place, and time.  Motor and sensory function is grossly intact  Head: Normocephalic and atraumatic.  Eyes: Conjunctivae are normal. Pupils are equal, round, and reactive to light. No scleral icterus.  Neck: Normal range of motion. Neck supple. No tracheal deviation or thyromegaly present.  Cardiovascular:  SR without murmurs or gallops.  No carotid bruits Respiratory: Effort normal.  No respiratory distress. No chest wall tenderness. Breath sounds normal.  No wheezes, rales or rhonchi.  Abdomen:  Abdomen is mildly protuberant, somewhat tympanitic to percussion, no rebound or guarding was elicited although the patient has been given pain medications. Patient was able to cough and move around in the bed without limitations. Patient has passed some flatus but none during exam. GU: Musculoskeletal: Normal range of motion. Extremities are nontender. No cyanosis, edema or clubbing noted Lymphadenopathy: No cervical, preauricular, postauricular or axillary adenopathy is present Skin: Skin is warm and dry. No rash noted. No diaphoresis. No erythema. No pallor. Pscyh: Normal mood and affect. Behavior is normal. Judgment and thought content normal.   LABORATORY RESULTS: Results for orders placed during the hospital encounter of 05/08/12 (from the past 48 hour(s))  COMPREHENSIVE METABOLIC PANEL     Status: Abnormal   Collection Time   05/08/12  4:28 PM      Component Value Range Comment   Sodium 132 (*) 135 - 145 (mEq/L)    Potassium 3.8  3.5 - 5.1 (mEq/L)    Chloride 99  96 - 112 (mEq/L)    CO2 21  19 - 32 (mEq/L)    Glucose, Bld 105 (*) 70 - 99 (mg/dL)    BUN 14  6 - 23 (mg/dL)    Creatinine, Ser 1.61  0.50 - 1.35 (mg/dL)    Calcium 8.6  8.4 - 10.5 (mg/dL)    Total Protein 6.4  6.0 - 8.3 (g/dL)    Albumin 2.5 (*) 3.5 - 5.2 (g/dL)    AST 25  0 - 37 (U/L)    ALT 28  0 - 53 (U/L)    Alkaline Phosphatase 79  39 - 117 (U/L)    Total Bilirubin 0.4  0.3 - 1.2 (mg/dL)    GFR calc non Af Amer 70 (*) >90 (mL/min)    GFR  calc Af Amer 82 (*) >90 (mL/min)   CBC     Status: Abnormal   Collection Time   05/08/12  4:28 PM      Component Value Range Comment   WBC 13.5 (*) 4.0 - 10.5 (K/uL)    RBC 4.54  4.22 - 5.81 (MIL/uL)  Hemoglobin 14.0  13.0 - 17.0 (g/dL)    HCT 16.1  09.6 - 04.5 (%)    MCV 86.1  78.0 - 100.0 (fL)    MCH 30.8  26.0 - 34.0 (pg)    MCHC 35.8  30.0 - 36.0 (g/dL)    RDW 40.9  81.1 - 91.4 (%)    Platelets 219  150 - 400 (K/uL)   DIFFERENTIAL     Status: Abnormal   Collection Time   05/08/12  4:28 PM      Component Value Range Comment   Neutrophils Relative 87 (*) 43 - 77 (%)    Neutro Abs 11.7 (*) 1.7 - 7.7 (K/uL)    Lymphocytes Relative 7 (*) 12 - 46 (%)    Lymphs Abs 0.9  0.7 - 4.0 (K/uL)    Monocytes Relative 7  3 - 12 (%)    Monocytes Absolute 0.9  0.1 - 1.0 (K/uL)    Eosinophils Relative 0  0 - 5 (%)    Eosinophils Absolute 0.0  0.0 - 0.7 (K/uL)    Basophils Relative 0  0 - 1 (%)    Basophils Absolute 0.0  0.0 - 0.1 (K/uL)     RADIOLOGY RESULTS: Ct Abdomen Pelvis W Contrast  05/08/2012  *RADIOLOGY REPORT*  Clinical Data: Severe abdominal pain/tenderness.  History of Crohn's ileal colitis.Alcohol dependent.  CT ABDOMEN AND PELVIS WITH CONTRAST  Technique:  Multidetector CT imaging of the abdomen and pelvis was performed following the standard protocol during bolus administration of intravenous contrast.  Contrast: OMNIPAQUE IOHEXOL 300 MG/ML  SOLN  Comparison: 04/11/2012  Findings: Mild motion degradation at the lung bases.  Patchy bibasilar airspace disease.  Mild cardiomegaly without pericardial or pleural effusion.  A small hiatal hernia.  Mild hepatic steatosis.  Enlargement caudate lobe with a subtle irregular hepatic contour, consistent with cirrhosis.  No focal liver lesion.  Patent hepatic and portal veins.  Old granulomatous disease in the spleen.  Normal distal stomach.  Prominent dorsal pancreatic duct on image 34.  Probable vicarious excretion of contrast by the  gallbladder.  No acute cholecystitis or biliary ductal dilatation.  Normal adrenal glands and kidneys.  Small retroperitoneal nodes are likely reactive.  Sigmoid colon diverticulosis.  There is underdistension and possible wall thickening on image 64.  Inflammation involving the terminal ileum is improved; image 51. The appendix is not visualized and could be surgically absent. Wall thickening involving distal small bowel loops persists.  Given differences in technique, similar.  Example image 58.  There is increased mesenteric edema in this region.  Small volume abdominal ascites.  No definite pneumatosis.  There is small volume free intraperitoneal air including in the right upper quadrant on image 21 and adjacent the inflamed bowel loops on image 63.  Mesenteric air on image 56 of extraluminal.  No arterial or venous thrombus. Ileocolic mesenteric adenopathy is likely reactive.  Fat containing bilateral inguinal hernias. No pelvic adenopathy. Normal urinary bladder and prostate.  Small volume cul-de-sac fluid on image 70.  Subtle peritoneal thickening / enhancement suggests complex fluid.  No drainable abscess.  Right sacroiliac joint fusion may be degenerative or related to Crohn's disease sacroiliitis.  IMPRESSION:  1.  Improved terminal ileitis with progressive distal ileitis. Development of small volume free intraperitoneal air, likely secondary.  Small volume abdominal pelvic ascites, with suggestion of complexity within the cul-de-sac fluid.  No drainable abscess. Critical test results telephoned to Danice Goltz, r.n. at the time of interpretation at 9:36  p.m. on 05/08/2012. 2.  Apparent sigmoid wall thickening could be due to underdistension or secondary colitis.  This is in the vicinity of the inflamed small bowel loops. 3.  Mild cirrhosis. 4.  Bibasilar airspace disease.  Favor atelectasis.  Early infection or aspiration cannot be excluded. 5.  Pancreatic findings which are suspicious for divisum.  No  acute pancreatitis.  Original Report Authenticated By: Consuello Bossier, M.D.    Problem List: Patient Active Problem List  Diagnoses  . Psoriasis  . GERD (gastroesophageal reflux disease)  . Bilateral inguinal hernia  . Personal history of colonic polyps-rectal adenoma  . Crohn's ileocolitis  . Cigarette smoker  . Calcified granuloma of lung  . Alcohol dependence    Assessment & Plan: I reviewed his CT scan and he does have a scan amount of free air and fluid and mesenteric stranding noted. Whereas his terminal ileum looks better he does have some thickened loops of more proximal small bowel in the area of mesenteric stranding. This could represent active Crohn's with enteral fistula formation.  For the moment he appears stable and one could see how he responds to IV antibiotics. Aspiration of fluid might hasten resolution of this inflammatory process. Failure to respond will likely necessitate exploratory laparotomy. He does have bilateral inguinal hernias that he asked whether they could be repaired at the same time as surgery but I told him that was unlikely since they tend to repair those with mesh. He has some underlying cirrhosis which will add to his risks. Optimally, he will respond to the antibiotics and could be treated for inflammatory bowel disease without surgery. CCS M.D. will follow    Matt B. Daphine Deutscher, MD, [facility] [phone] beeper [phone]  05/08/2012 11:24 PM

## 2012-05-08 NOTE — Assessment & Plan Note (Signed)
In terrible pain today, abdomen appears soft but having intermittent abdominal pain with and without movement. Does not appear to be obstructed in that not having vomiting but could be. Needs admission, pain control, empiric antibiotics, labs and CT scan. Once that is done will start steroids unless contraindicated.

## 2012-05-08 NOTE — Telephone Encounter (Signed)
Please call him today - see if he can come in to see me this afternoon, please We can see about starting therapy

## 2012-05-08 NOTE — Progress Notes (Signed)
RN notify MD on call about  Pt CT results. Surgery will come by to see pt per MD. Continue to keep pt NPO.

## 2012-05-08 NOTE — Telephone Encounter (Signed)
His Coccidioides antibody, cryptococcal antigen, interferon gamma release assay, HIV antibody, hepatitis B surface antigen, hepatitis B surface antibody and hepatitis C antibody were all negative. I do not see any absolute contraindication to immunosuppressive therapy for his recently diagnosed Crohn's disease. I discussed these results with him. He is scheduled to followup with Dr. Leone Payor today.

## 2012-05-08 NOTE — Patient Instructions (Signed)
You have been given a separate informational sheet regarding your tobacco use, the importance of quitting and local resources to help you quit.  

## 2012-05-09 DIAGNOSIS — K5100 Ulcerative (chronic) pancolitis without complications: Secondary | ICD-10-CM

## 2012-05-09 DIAGNOSIS — K5080 Crohn's disease of both small and large intestine without complications: Principal | ICD-10-CM

## 2012-05-09 DIAGNOSIS — R1084 Generalized abdominal pain: Secondary | ICD-10-CM

## 2012-05-09 MED ORDER — LORAZEPAM 2 MG/ML IJ SOLN: 0.0000 mg | Freq: Four times a day (QID) | INTRAMUSCULAR | Status: AC

## 2012-05-09 MED ORDER — ADULT MULTIVITAMIN W/MINERALS CH
1.0000 | ORAL_TABLET | Freq: Every day | ORAL | Status: DC
  Administered 2012-05-09 – 2012-05-13 (×5): 1 via ORAL
  Filled 2012-05-09 (×5): qty 1

## 2012-05-09 MED ORDER — LORAZEPAM 2 MG/ML IJ SOLN
0.0000 mg | Freq: Two times a day (BID) | INTRAMUSCULAR | Status: AC
Start: 2012-05-11 — End: 2012-05-13

## 2012-05-09 MED ORDER — VITAMIN B-1 100 MG PO TABS
100.0000 mg | ORAL_TABLET | Freq: Every day | ORAL | Status: DC
  Administered 2012-05-09 – 2012-05-13 (×4): 100 mg via ORAL
  Filled 2012-05-09 (×5): qty 1

## 2012-05-09 MED ORDER — LORAZEPAM 2 MG/ML IJ SOLN: 1.0000 mg | Freq: Four times a day (QID) | INTRAMUSCULAR | Status: AC | PRN

## 2012-05-09 MED ORDER — CHLORDIAZEPOXIDE HCL 5 MG PO CAPS
5.0000 mg | ORAL_CAPSULE | Freq: Three times a day (TID) | ORAL | Status: DC
  Administered 2012-05-09: 5 mg via ORAL
  Filled 2012-05-09 (×6): qty 1

## 2012-05-09 MED ORDER — THIAMINE HCL 100 MG/ML IJ SOLN
100.0000 mg | Freq: Every day | INTRAMUSCULAR | Status: DC
  Administered 2012-05-10: 100 mg via INTRAVENOUS
  Filled 2012-05-09 (×6): qty 1

## 2012-05-09 MED ORDER — LORAZEPAM 1 MG PO TABS: 1.0000 mg | ORAL_TABLET | Freq: Four times a day (QID) | ORAL | Status: AC | PRN

## 2012-05-09 MED ORDER — FOLIC ACID 1 MG PO TABS
1.0000 mg | ORAL_TABLET | Freq: Every day | ORAL | Status: DC
  Administered 2012-05-09 – 2012-05-13 (×5): 1 mg via ORAL
  Filled 2012-05-09 (×6): qty 1

## 2012-05-09 NOTE — Progress Notes (Addendum)
Subjective: Very animated, says he's doing dilaudid.  Pain better than yesterday, but Dr. Christella Hartigan is concerned he's more tender than he should be.  Objective: Vital signs in last 24 hours: Temp:  [98.3 F (36.8 C)-99.4 F (37.4 C)] 98.9 F (37.2 C) (06/11 0610) Pulse Rate:  [76-92] 77  (06/11 0610) Resp:  [18] 18  (06/11 0610) BP: (90-123)/(48-74) 96/63 mmHg (06/11 0610) SpO2:  [94 %-95 %] 95 % (06/11 0610) Weight:  [70.217 kg (154 lb 12.8 oz)-71.215 kg (157 lb)] 71.215 kg (157 lb) (06/10 1539) Last BM Date: 05/08/12  NPO, AFEBRILE, bp IN THE 90'S, wbc 13.5  Intake/Output from previous day:   Intake/Output this shift:    General appearance: alert, cooperative, no distress and he seems very plesant and not acutely ill Resp: clear to auscultation bilaterally GI: soft, tender over midepigastric area, some guarding on exam .  hurts to move around some,  +BS, not really distended.  Lab Results:   [facility] 05/08/12 1628  WBC 13.5*  HGB 14.0  HCT 39.1  PLT 219    BMET  Basename 05/08/12 1628  NA 132*  K 3.8  CL 99  CO2 21  GLUCOSE 105*  BUN 14  CREATININE 1.13  CALCIUM 8.6   PT/INR No results found for this basename: LABPROT:2,INR:2 in the last 72 hours   Lab 05/08/12 1628  AST 25  ALT 28  ALKPHOS 79  BILITOT 0.4  PROT 6.4  ALBUMIN 2.5*     Lipase  No results found for this basename: lipase     Studies/Results: Ct Abdomen Pelvis W Contrast  05/08/2012  *RADIOLOGY REPORT*  Clinical Data: Severe abdominal pain/tenderness.  History of Crohn's ileal colitis.Alcohol dependent.  CT ABDOMEN AND PELVIS WITH CONTRAST  Technique:  Multidetector CT imaging of the abdomen and pelvis was performed following the standard protocol during bolus administration of intravenous contrast.  Contrast: OMNIPAQUE IOHEXOL 300 MG/ML  SOLN  Comparison: 04/11/2012  Findings: Mild motion degradation at the lung bases.  Patchy bibasilar airspace disease.  Mild cardiomegaly  without pericardial or pleural effusion.  A small hiatal hernia.  Mild hepatic steatosis.  Enlargement caudate lobe with a subtle irregular hepatic contour, consistent with cirrhosis.  No focal liver lesion.  Patent hepatic and portal veins.  Old granulomatous disease in the spleen.  Normal distal stomach.  Prominent dorsal pancreatic duct on image 34.  Probable vicarious excretion of contrast by the gallbladder.  No acute cholecystitis or biliary ductal dilatation.  Normal adrenal glands and kidneys.  Small retroperitoneal nodes are likely reactive.  Sigmoid colon diverticulosis.  There is underdistension and possible wall thickening on image 64.  Inflammation involving the terminal ileum is improved; image 51. The appendix is not visualized and could be surgically absent. Wall thickening involving distal small bowel loops persists.  Given differences in technique, similar.  Example image 58.  There is increased mesenteric edema in this region.  Small volume abdominal ascites.  No definite pneumatosis.  There is small volume free intraperitoneal air including in the right upper quadrant on image 21 and adjacent the inflamed bowel loops on image 63.  Mesenteric air on image 56 of extraluminal.  No arterial or venous thrombus. Ileocolic mesenteric adenopathy is likely reactive.  Fat containing bilateral inguinal hernias. No pelvic adenopathy. Normal urinary bladder and prostate.  Small volume cul-de-sac fluid on image 70.  Subtle peritoneal thickening / enhancement suggests complex fluid.  No drainable abscess.  Right sacroiliac joint fusion may be degenerative  or related to Crohn's disease sacroiliitis.  IMPRESSION:  1.  Improved terminal ileitis with progressive distal ileitis. Development of small volume free intraperitoneal air, likely secondary.  Small volume abdominal pelvic ascites, with suggestion of complexity within the cul-de-sac fluid.  No drainable abscess. Critical test results telephoned to Danice Goltz, r.n. at the time of interpretation at 9:36 p.m. on 05/08/2012. 2.  Apparent sigmoid wall thickening could be due to underdistension or secondary colitis.  This is in the vicinity of the inflamed small bowel loops. 3.  Mild cirrhosis. 4.  Bibasilar airspace disease.  Favor atelectasis.  Early infection or aspiration cannot be excluded. 5.  Pancreatic findings which are suspicious for divisum.  No acute pancreatitis.  Original Report Authenticated By: Consuello Bossier, M.D.    Medications:    . ciprofloxacin  400 mg Intravenous Q12H  . enoxaparin  40 mg Subcutaneous Q24H  . metronidazole  500 mg Intravenous Q6H  . sodium chloride  1,000 mL Intravenous Once    Assessment/Plan Crohn's ileocolitis,, with free air; perforation vs early fistula, no drainable abscess Cirrhosis by CT/hx of ETOH use 1/2 gal bourbon/wk. Tobacco use, 1.5 PPD since teen years/COPD MJ use Bilateral inguinal hernias Hx gerd hX OF psoriasis   Plan:  Keep him NPO, continue IV antibiotics, will discuss with Dr. Gerrit Friends between his cases.    LOS: 1 day    JENNINGS,WILLARD 05/09/2012  General surgery attending note:  I have interviewed and examined this patient. I reviewed Dr. Marvell Fuller   history and physical as well as his CT scans. The patient states that he feels better today. He denies nausea and vomiting he says he has passed a little flatus and had some stool but still has abdominal pain.  On exam he is alert and doesn't appear to be any significant distress. He is afebrile. Heart rate 78. Respirations 18 and unlabored. Skin warm and dry. Abdomen borderline distended. Diffusely tender. Diffuse guarding. Occasional bowel sounds but not much. No abdominal or inguinal hernias detected.  CT scan shows a tiny amount of free air, inflammation of the ileum, thickening of the sigmoid colon. Contrast does go through the stomach and through most of the small bowel without extravasation.  Assessment and plan: It is  not clear whether he has had a small bowel perforation from his Crohn's disease, perforation of the sigmoid colon from diverticulitis. Perforated peptic ulcer seems less likely since there was no extravasation of contrast.  For tonight will  continue bowel rest, broad-spectrum antibiotics, and plan to repeat evaluation in the morning.  His abdominal tenderness is of concern and I think unless this improves he will probably require  laparotomy tomorrow.   Angelia Mould. Derrell Lolling, M.D., [facility] General and Minimally invasive Surgery Breast and Colorectal Surgery Office:   [phone] Pager:   [phone]

## 2012-05-09 NOTE — Progress Notes (Signed)
[facility] Progress Note  SUBJECTIVE: feels okay, still has abdominal pain if moves around too much  OBJECTIVE:  Vital signs in last 24 hours: Temp:  [98.3 F (36.8 C)-99.4 F (37.4 C)] 98.9 F (37.2 C) (06/11 0610) Pulse Rate:  [76-92] 77  (06/11 0610) Resp:  [18] 18  (06/11 0610) BP: (90-123)/(48-74) 96/63 mmHg (06/11 0610) SpO2:  [94 %-95 %] 95 % (06/11 0610) Weight:  [154 lb 12.8 oz (70.217 kg)-157 lb (71.215 kg)] 157 lb (71.215 kg) (06/10 1539) Last BM Date: 05/08/12 General:    Pleasant white male in NAD Heart:  Regular rate and rhythm Lungs: Respirations even and unlabored,crackles in bilateral upper and lower lobes Abdomen:  Soft,  nondistended, moderate RLQ tenderness. No peritoneal signs. Normal bowel sounds. Extremities:  Without edema. Neurologic:  Alert and oriented,  grossly normal neurologically. Psych:  Cooperative. Normal mood and affect.  Lab Results:  [facility] 05/08/12 1628  WBC 13.5*  HGB 14.0  HCT 39.1  PLT 219   BMET  Basename 05/08/12 1628  NA 132*  K 3.8  CL 99  CO2 21  GLUCOSE 105*  BUN 14  CREATININE 1.13  CALCIUM 8.6   LFT  Basename 05/08/12 1628  PROT 6.4  ALBUMIN 2.5*  AST 25  ALT 28  ALKPHOS 79  BILITOT 0.4  BILIDIR --  IBILI --   PT/INR No results found for this basename: LABPROT:2,INR:2 in the last 72 hours  Studies/Results: Ct Abdomen Pelvis W Contrast  05/08/2012  *RADIOLOGY REPORT*  Clinical Data: Severe abdominal pain/tenderness.  History of Crohn's ileal colitis.Alcohol dependent.  CT ABDOMEN AND PELVIS WITH CONTRAST  Technique:  Multidetector CT imaging of the abdomen and pelvis was performed following the standard protocol during bolus administration of intravenous contrast.  Contrast: OMNIPAQUE IOHEXOL 300 MG/ML  SOLN  Comparison: 04/11/2012  Findings: Mild motion degradation at the lung bases.  Patchy bibasilar airspace disease.  Mild cardiomegaly without pericardial or pleural effusion.  A  small hiatal hernia.  Mild hepatic steatosis.  Enlargement caudate lobe with a subtle irregular hepatic contour, consistent with cirrhosis.  No focal liver lesion.  Patent hepatic and portal veins.  Old granulomatous disease in the spleen.  Normal distal stomach.  Prominent dorsal pancreatic duct on image 34.  Probable vicarious excretion of contrast by the gallbladder.  No acute cholecystitis or biliary ductal dilatation.  Normal adrenal glands and kidneys.  Small retroperitoneal nodes are likely reactive.  Sigmoid colon diverticulosis.  There is underdistension and possible wall thickening on image 64.  Inflammation involving the terminal ileum is improved; image 51. The appendix is not visualized and could be surgically absent. Wall thickening involving distal small bowel loops persists.  Given differences in technique, similar.  Example image 58.  There is increased mesenteric edema in this region.  Small volume abdominal ascites.  No definite pneumatosis.  There is small volume free intraperitoneal air including in the right upper quadrant on image 21 and adjacent the inflamed bowel loops on image 63.  Mesenteric air on image 56 of extraluminal.  No arterial or venous thrombus. Ileocolic mesenteric adenopathy is likely reactive.  Fat containing bilateral inguinal hernias. No pelvic adenopathy. Normal urinary bladder and prostate.  Small volume cul-de-sac fluid on image 70.  Subtle peritoneal thickening / enhancement suggests complex fluid.  No drainable abscess.  Right sacroiliac joint fusion may be degenerative or related to Crohn's disease sacroiliitis.  IMPRESSION:  1.  Improved terminal ileitis with progressive distal ileitis. Development of  small volume free intraperitoneal air, likely secondary.  Small volume abdominal pelvic ascites, with suggestion of complexity within the cul-de-sac fluid.  No drainable abscess. Critical test results telephoned to Danice Goltz, r.n. at the time of interpretation at  9:36 p.m. on 05/08/2012. 2.  Apparent sigmoid wall thickening could be due to underdistension or secondary colitis.  This is in the vicinity of the inflamed small bowel loops. 3.  Mild cirrhosis. 4.  Bibasilar airspace disease.  Favor atelectasis.  Early infection or aspiration cannot be excluded. 5.  Pancreatic findings which are suspicious for divisum.  No acute pancreatitis.  Original Report Authenticated By: Consuello Bossier, M.D.     ASSESSMENT / PLAN:  1. Crohn's ileocolitis, recently diagnosed. Admitted yesterday with abdominal pain. CTscan revealed progressive distal ileitis, small volume free air and small volume abdominal pelvic ascites. WBC 13.5 yesterday, no labs ordered for today. Tmax 99.4. Appreciate surgery's help. Patient on Flagyl and Cipro. Will hold off on steroids for now given perforation.Overall he is feeling okay. Ambulating.  For eventual biologic therapy as outpatient. Would hold off on steroids for now given perforation. Check am labs. NPO until okay with surgery to have PO.  2. Cirrhosis by CTscan. He does drink ETOH. We discussed need to discontinue ETOH.  3. Questionable pancreatic divisum on CTscan.     LOS: 1 day   Willette Cluster  05/09/2012, 10:29 AM   ________________________________________________________________________  [facility] G[facility] MD note:  I personally examined the patient, reviewed the data and agree with the assessment and plan described above.  He is more tender than I was expecting, due for narcotic around now (getting it every 2 hours). He is not feeling better compared to when he came in.  I spoke with Zola Button, PA from [facility] who will be examining him shortly.     Rob Bunting, MD [facility] Pager [phone]

## 2012-05-10 ENCOUNTER — Inpatient Hospital Stay (HOSPITAL_COMMUNITY): Payer: BC Managed Care – PPO

## 2012-05-10 LAB — COMPREHENSIVE METABOLIC PANEL
ALT: 16 U/L (ref 0–53)
AST: 12 U/L (ref 0–37)
Albumin: 1.9 g/dL — ABNORMAL LOW (ref 3.5–5.2)
Alkaline Phosphatase: 64 U/L (ref 39–117)
Potassium: 3.4 mEq/L — ABNORMAL LOW (ref 3.5–5.1)
Sodium: 136 mEq/L (ref 135–145)
Total Protein: 5.3 g/dL — ABNORMAL LOW (ref 6.0–8.3)

## 2012-05-10 LAB — CBC
HCT: 35.3 % — ABNORMAL LOW (ref 39.0–52.0)
Platelets: 257 10*3/uL (ref 150–400)
RDW: 14.1 % (ref 11.5–15.5)
WBC: 12 10*3/uL — ABNORMAL HIGH (ref 4.0–10.5)

## 2012-05-10 MED ORDER — HYDROMORPHONE HCL PF 1 MG/ML IJ SOLN
1.0000 mg | INTRAMUSCULAR | Status: DC | PRN
  Administered 2012-05-10: 1 mg via INTRAVENOUS
  Administered 2012-05-10: 2 mg via INTRAVENOUS
  Administered 2012-05-10 – 2012-05-12 (×5): 1 mg via INTRAVENOUS
  Administered 2012-05-13 – 2012-05-14 (×5): 2 mg via INTRAVENOUS
  Administered 2012-05-14: 1 mg via INTRAVENOUS
  Filled 2012-05-10 (×3): qty 2
  Filled 2012-05-10: qty 1
  Filled 2012-05-10 (×2): qty 2
  Filled 2012-05-10 (×4): qty 1
  Filled 2012-05-10: qty 30
  Filled 2012-05-10 (×2): qty 1
  Filled 2012-05-10: qty 2

## 2012-05-10 NOTE — Progress Notes (Signed)
[facility] Progress Note  SUBJECTIVE: very thirsty. Pain overall better but still with some diffuse abdominal pain and intermittent RLQ sharp pain  OBJECTIVE:  Vital signs in last 24 hours: Temp:  [99.1 F (37.3 C)-100 F (37.8 C)] 99.5 F (37.5 C) (06/12 0817) Pulse Rate:  [75-83] 80  (06/12 0817) Resp:  [18] 18  (06/12 0817) BP: (98-109)/(53-67) 107/67 mmHg (06/12 0817) SpO2:  [92 %-94 %] 93 % (06/12 0817) Last BM Date: 05/08/12 General:    white male in NAD Lungs: Respirations even and unlabored, bilateral crackles throughout Abdomen:  Soft, nontender and moderate localized RLQ tenderness (just had pain meds).  Normal bowel sounds. Extremities:  Without edema. Neurologic:  Alert and oriented,  grossly normal neurologically. Psych:  Cooperative. Normal mood and affect.   Lab Results:  [facility] 05/10/12 0333 05/08/12 1628  WBC 12.0* 13.5*  HGB 12.1* 14.0  HCT 35.3* 39.1  PLT 257 219   BMET  Basename 05/10/12 0333 05/08/12 1628  NA 136 132*  K 3.4* 3.8  CL 105 99  CO2 21 21  GLUCOSE 123* 105*  BUN 13 14  CREATININE 0.97 1.13  CALCIUM 8.0* 8.6   LFT  Basename 05/10/12 0333  PROT 5.3*  ALBUMIN 1.9*  AST 12  ALT 16  ALKPHOS 64  BILITOT 0.4  BILIDIR --  IBILI --   PT/INR  Basename 05/09/12 1315  LABPROT 14.6  INR 1.12    Studies/Results: Ct Abdomen Pelvis W Contrast  05/08/2012  *RADIOLOGY REPORT*  Clinical Data: Severe abdominal pain/tenderness.  History of Crohn's ileal colitis.Alcohol dependent.  CT ABDOMEN AND PELVIS WITH CONTRAST  Technique:  Multidetector CT imaging of the abdomen and pelvis was performed following the standard protocol during bolus administration of intravenous contrast.  Contrast: OMNIPAQUE IOHEXOL 300 MG/ML  SOLN  Comparison: 04/11/2012  Findings: Mild motion degradation at the lung bases.  Patchy bibasilar airspace disease.  Mild cardiomegaly without pericardial or pleural effusion.  A small hiatal hernia.   Mild hepatic steatosis.  Enlargement caudate lobe with a subtle irregular hepatic contour, consistent with cirrhosis.  No focal liver lesion.  Patent hepatic and portal veins.  Old granulomatous disease in the spleen.  Normal distal stomach.  Prominent dorsal pancreatic duct on image 34.  Probable vicarious excretion of contrast by the gallbladder.  No acute cholecystitis or biliary ductal dilatation.  Normal adrenal glands and kidneys.  Small retroperitoneal nodes are likely reactive.  Sigmoid colon diverticulosis.  There is underdistension and possible wall thickening on image 64.  Inflammation involving the terminal ileum is improved; image 51. The appendix is not visualized and could be surgically absent. Wall thickening involving distal small bowel loops persists.  Given differences in technique, similar.  Example image 58.  There is increased mesenteric edema in this region.  Small volume abdominal ascites.  No definite pneumatosis.  There is small volume free intraperitoneal air including in the right upper quadrant on image 21 and adjacent the inflamed bowel loops on image 63.  Mesenteric air on image 56 of extraluminal.  No arterial or venous thrombus. Ileocolic mesenteric adenopathy is likely reactive.  Fat containing bilateral inguinal hernias. No pelvic adenopathy. Normal urinary bladder and prostate.  Small volume cul-de-sac fluid on image 70.  Subtle peritoneal thickening / enhancement suggests complex fluid.  No drainable abscess.  Right sacroiliac joint fusion may be degenerative or related to Crohn's disease sacroiliitis.  IMPRESSION:  1.  Improved terminal ileitis with progressive distal ileitis. Development of small  volume free intraperitoneal air, likely secondary.  Small volume abdominal pelvic ascites, with suggestion of complexity within the cul-de-sac fluid.  No drainable abscess. Critical test results telephoned to Danice Goltz, r.n. at the time of interpretation at 9:36 p.m. on 05/08/2012.  2.  Apparent sigmoid wall thickening could be due to underdistension or secondary colitis.  This is in the vicinity of the inflamed small bowel loops. 3.  Mild cirrhosis. 4.  Bibasilar airspace disease.  Favor atelectasis.  Early infection or aspiration cannot be excluded. 5.  Pancreatic findings which are suspicious for divisum.  No acute pancreatitis.  Original Report Authenticated By: Consuello Bossier, M.D.   ASSESSMENT / PLAN:  1. Crohn's ileocolitis, recently diagnosed. Admitted two days ago bowel perforation.  Appreciate surgery's help. Patient slowly improving on Flagyl and Cipro. WBC down to 12.0. Tmax 99.5. Will hold off on steroids for now given perforation.Overall he is feeling okay.  For eventual biologic therapy as outpatient. Right now, management per surgery.   2. Cirrhosis by CTscan. He does drink ETOH. We have discussed need to discontinue ETOH.   3. Questionable pancreatic divisum on CTscan  4. ETOH abuse. We started CIWA protocol yesterday but he isn't taking Librium or Ativan. Encouraged him to comply.       LOS: 2 days   Willette Cluster  05/10/2012, 10:07 AM  ________________________________________________________________________  [facility] MD note:  I personally examined the patient, reviewed the data and agree with the assessment and plan described above.  He still seems quite tender on examination and tells me today that his pain is not greatly controlled (asking to increase his pain meds).  +flatus, very minimal bowel sounds.  Will continue current management with IV abx, NPO; appreciate surgery input.  I increased his dilauded to 1-2mg  q 2 hours.   Rob Bunting, MD [facility] Pager [phone]

## 2012-05-10 NOTE — Progress Notes (Signed)
Patient ID: Samuel Montgomery, male   DOB: 08/14/1956, 56 y.o.   MRN: [id number]  General Surgery - [facility] - Progress Note  Subjective: Patient talkative, animated.  Mild abdominal pain - patient states it has improved since Monday.  Passing flatus, liquid BM.  Objective: Vital signs in last 24 hours: Temp:  [99.1 F (37.3 C)-100 F (37.8 C)] 99.5 F (37.5 C) (06/12 0817) Pulse Rate:  [75-83] 80  (06/12 0817) Resp:  [18] 18  (06/12 0817) BP: (98-109)/(53-67) 107/67 mmHg (06/12 0817) SpO2:  [92 %-94 %] 93 % (06/12 0817) Last BM Date: 05/08/12  Intake/Output from previous day: 06/11 0701 - 06/12 0700 In: 5585 [I.V.:5585] Out: -   Exam: HEENT - clear, not icteric Neck - soft Chest - clear bilaterally Cor - RRR, no murmur Abd - mild distension, quiet; moderate tenderness RLQ with guarding; no mass; no hernia Ext - no significant edema Neuro - grossly intact, no focal deficits  Lab Results:   [facility] 05/10/12 0333 05/08/12 1628  WBC 12.0* 13.5*  HGB 12.1* 14.0  HCT 35.3* 39.1  PLT 257 219     Basename 05/10/12 0333 05/08/12 1628  NA 136 132*  K 3.4* 3.8  CL 105 99  CO2 21 21  GLUCOSE 123* 105*  BUN 13 14  CREATININE 0.97 1.13  CALCIUM 8.0* 8.6    Studies/Results: Ct Abdomen Pelvis W Contrast  05/08/2012  *RADIOLOGY REPORT*  Clinical Data: Severe abdominal pain/tenderness.  History of Crohn's ileal colitis.Alcohol dependent.  CT ABDOMEN AND PELVIS WITH CONTRAST  Technique:  Multidetector CT imaging of the abdomen and pelvis was performed following the standard protocol during bolus administration of intravenous contrast.  Contrast: OMNIPAQUE IOHEXOL 300 MG/ML  SOLN  Comparison: 04/11/2012  Findings: Mild motion degradation at the lung bases.  Patchy bibasilar airspace disease.  Mild cardiomegaly without pericardial or pleural effusion.  A small hiatal hernia.  Mild hepatic steatosis.  Enlargement caudate lobe with a subtle irregular hepatic  contour, consistent with cirrhosis.  No focal liver lesion.  Patent hepatic and portal veins.  Old granulomatous disease in the spleen.  Normal distal stomach.  Prominent dorsal pancreatic duct on image 34.  Probable vicarious excretion of contrast by the gallbladder.  No acute cholecystitis or biliary ductal dilatation.  Normal adrenal glands and kidneys.  Small retroperitoneal nodes are likely reactive.  Sigmoid colon diverticulosis.  There is underdistension and possible wall thickening on image 64.  Inflammation involving the terminal ileum is improved; image 51. The appendix is not visualized and could be surgically absent. Wall thickening involving distal small bowel loops persists.  Given differences in technique, similar.  Example image 58.  There is increased mesenteric edema in this region.  Small volume abdominal ascites.  No definite pneumatosis.  There is small volume free intraperitoneal air including in the right upper quadrant on image 21 and adjacent the inflamed bowel loops on image 63.  Mesenteric air on image 56 of extraluminal.  No arterial or venous thrombus. Ileocolic mesenteric adenopathy is likely reactive.  Fat containing bilateral inguinal hernias. No pelvic adenopathy. Normal urinary bladder and prostate.  Small volume cul-de-sac fluid on image 70.  Subtle peritoneal thickening / enhancement suggests complex fluid.  No drainable abscess.  Right sacroiliac joint fusion may be degenerative or related to Crohn's disease sacroiliitis.  IMPRESSION:  1.  Improved terminal ileitis with progressive distal ileitis. Development of small volume free intraperitoneal air, likely secondary.  Small volume abdominal pelvic  ascites, with suggestion of complexity within the cul-de-sac fluid.  No drainable abscess. Critical test results telephoned to Danice Goltz, r.n. at the time of interpretation at 9:36 p.m. on 05/08/2012. 2.  Apparent sigmoid wall thickening could be due to underdistension or secondary  colitis.  This is in the vicinity of the inflamed small bowel loops. 3.  Mild cirrhosis. 4.  Bibasilar airspace disease.  Favor atelectasis.  Early infection or aspiration cannot be excluded. 5.  Pancreatic findings which are suspicious for divisum.  No acute pancreatitis.  Original Report Authenticated By: Consuello Bossier, M.D.   Dg Abd 2 Views  05/10/2012  *RADIOLOGY REPORT*  Clinical Data: Abdominal pain.  Free air.  ABDOMEN - 2 VIEW  Comparison: 05/08/2012 CT.  Findings: The CT detected of free intraperitoneal air is not well delineated below the right hemidiaphragm.  On the upright view, there may be loculated free air below the left hemidiaphragm although it is possible this is related to air within the stomach.  Nonspecific bowel gas pattern with residual contrast within colon. Descending colon and sigmoid colon diverticula with incomplete distension and fold/bowel wall thickening.  Gas filled small bowel loops with thickened folds measuring up to 2.9 cm.  IMPRESSION: CT detected free intraperitoneal air is not as well delineated on the present plain film examination.  Abnormal bowel gas pattern with small bowel loops having thickened folds and descending colon under distension with wall thickening/diverticula.  Please see prior CT report.  Original Report Authenticated By: Fuller Canada, M.D.    Assessment / Plan: 1.  Abdominal pain with free intraperitoneal air from undetermined source - ? Crohn's, ? Diverticular disease, ? PUD  - continue empiric abx  - continue NPO, IVF  - will follow exam and labs  - hold off on laparotomy for now - may yet come to surgery if clinical condition deteriorates  - will follow with you  Velora Heckler, MD, [facility] Office: [phone]  05/10/2012

## 2012-05-11 ENCOUNTER — Inpatient Hospital Stay (HOSPITAL_COMMUNITY): Payer: BC Managed Care – PPO

## 2012-05-11 LAB — CBC
HCT: 34.9 % — ABNORMAL LOW (ref 39.0–52.0)
MCHC: 34.4 g/dL (ref 30.0–36.0)
MCV: 87.9 fL (ref 78.0–100.0)
RDW: 14.3 % (ref 11.5–15.5)

## 2012-05-11 LAB — BASIC METABOLIC PANEL
BUN: 10 mg/dL (ref 6–23)
CO2: 21 mEq/L (ref 19–32)
Calcium: 7.8 mg/dL — ABNORMAL LOW (ref 8.4–10.5)
Creatinine, Ser: 0.89 mg/dL (ref 0.50–1.35)

## 2012-05-11 LAB — DIFFERENTIAL
Basophils Absolute: 0 10*3/uL (ref 0.0–0.1)
Basophils Relative: 0 % (ref 0–1)
Eosinophils Relative: 1 % (ref 0–5)
Monocytes Absolute: 1 10*3/uL (ref 0.1–1.0)

## 2012-05-11 MED ORDER — POTASSIUM CHLORIDE IN NACL 20-0.9 MEQ/L-% IV SOLN
INTRAVENOUS | Status: DC
  Administered 2012-05-11: 14:00:00 via INTRAVENOUS
  Filled 2012-05-11 (×3): qty 1000

## 2012-05-11 NOTE — Progress Notes (Signed)
General Surgery [facility] - Attending  Patient seen and examined.  To trial clear liquids.  Elevated WBC noted.  Will follow closely.  Velora Heckler, MD, [facility] Office: [phone]

## 2012-05-11 NOTE — Progress Notes (Signed)
Patient ID: Samuel Montgomery, male   DOB: Oct 31, 1956, 56 y.o.   MRN: [id number] . Patient ID: Samuel Montgomery, male   DOB: [birth year], 56 y.o.   MRN: [id number]  General Surgery - [facility] - Progress Note  Subjective: Patient talkative, Mild abdominal pain - esp in right lowe quad (patient has marked the area with a marker). Passing flatus, liquid BM.  Objective: Vital signs in last 24 hours: Temp:  [98 F (36.7 C)-99.5 F (37.5 C)] 99.5 F (37.5 C) May 22, 2023 0510) Pulse Rate:  [75-83] 75  05/22/2023 0510) Resp:  [16-18] 16  May 22, 2023 0510) BP: (96-103)/(53-61) 100/61 mmHg May 22, 2023 0510) SpO2:  [92 %-95 %] 95 % May 22, 2023 0510) Last BM Date: 05/08/12  Intake/Output from previous day: 06/12 0701 - 22-May-2023 0700 In: 3905.5 [I.V.:3805.5; IV Piggyback:100] Out: -   Exam: HEENT - clear, not icteric Chest - clear bilaterally Heart - RRR, no murmur Abd - mild distension, +BS; moderate tenderness RLQ with guarding Ext - no significant edema Neuro - grossly intact, no focal deficits  Lab Results:   [facility] 2012/05/21 0327 05/10/12 0333  WBC 16.7* 12.0*  HGB 12.0* 12.1*  HCT 34.9* 35.3*  PLT 286 257     Basename 21-May-2012 0327 05/10/12 0333  NA 138 136  K 3.3* 3.4*  CL 106 105  CO2 21 21  GLUCOSE 114* 123*  BUN 10 13  CREATININE 0.89 0.97  CALCIUM 7.8* 8.0*    Studies/Results: Dg Abd 2 Views  May 21, 2012  *RADIOLOGY REPORT*  Clinical Data: Lower abdominal tightness.  ABDOMEN - 2 VIEW  Comparison: Two views the abdomen 05/10/2012.  CT abdomen and pelvis 05/08/2012.  Findings:  Scattered air-fluid levels in small and large bowel are again seen.  Contrast material is again noted within the colon. Free intraperitoneal air seen on comparison CT scan is not well demonstrated on this study.  Linear radiopaque structure projecting the lower chest is noted.  There is basilar atelectasis.  IMPRESSION:  1.  Free intraperitoneal air seen on prior CT is not well demonstrated on this  examination. 2.  No change in bowel gas pattern with scattered air-fluid levels again seen. 3.  Radiopaque structure projecting in the lower chest may be external to the patient.  If it is a feeding tube, its tip is in the distal esophagus.  Original Report Authenticated By: Bernadene Bell. Maricela Curet, M.D.   Dg Abd 2 Views  05/10/2012  *RADIOLOGY REPORT*  Clinical Data: Abdominal pain.  Free air.  ABDOMEN - 2 VIEW  Comparison: 05/08/2012 CT.  Findings: The CT detected of free intraperitoneal air is not well delineated below the right hemidiaphragm.  On the upright view, there may be loculated free air below the left hemidiaphragm although it is possible this is related to air within the stomach.  Nonspecific bowel gas pattern with residual contrast within colon. Descending colon and sigmoid colon diverticula with incomplete distension and fold/bowel wall thickening.  Gas filled small bowel loops with thickened folds measuring up to 2.9 cm.  IMPRESSION: CT detected free intraperitoneal air is not as well delineated on the present plain film examination.  Abnormal bowel gas pattern with small bowel loops having thickened folds and descending colon under distension with wall thickening/diverticula.  Please see prior CT report.  Original Report Authenticated By: Fuller Canada, M.D.    Assessment / Plan: 1.  Abdominal pain with free intraperitoneal air from undetermined source - ? Crohn's, ? Diverticular disease, ? PUD  -  continue empiric abx  - x-ray today does not appreciate the free air  -  WBC up to 16, GI believes this is related to the IBD, patient clinically appears better.  - ? Start some liquids today and see if tolerates, will discuss with Dr. Gerrit Friends.  - will follow with you  Maie Kesinger 11:36 AM 05/11/2012 [facility] Office: [phone]  05/11/2012

## 2012-05-11 NOTE — Progress Notes (Signed)
[facility] Progress Note  SUBJECTIVE: Thirsty, hungry and tired. Abdominal pain is better, no pain meds since 1am.  OBJECTIVE:  Vital signs in last 24 hours: Temp:  [98 F (36.7 C)-99.5 F (37.5 C)] 99.5 F (37.5 C) 2023/05/21 0510) Pulse Rate:  [75-83] 75  May 21, 2023 0510) Resp:  [16-18] 16  21-May-2023 0510) BP: (96-103)/(53-61) 100/61 mmHg May 21, 2023 0510) SpO2:  [92 %-95 %] 95 % 2023-05-21 0510) Last BM Date: 05/08/12 General:    white male in NAD Heart:  Regular rate and rhythm Lungs: Respirations even and unlabored, Bilateral crackles Abdomen:  Soft, mildly distending, hypoactive bowel sounds. Localized RLQ tenderness.  Extremities:  Without edema. Neurologic:  Alert and oriented,  grossly normal neurologically. Psych:  Cooperative. Normal mood and affect.   Lab Results:  Basename 2012/05/20 0327 05/10/12 0333 05/08/12 1628  WBC 16.7* 12.0* 13.5*  HGB 12.0* 12.1* 14.0  HCT 34.9* 35.3* 39.1  PLT 286 257 219   BMET  Basename 05/20/12 0327 05/10/12 0333 05/08/12 1628  NA 138 136 132*  K 3.3* 3.4* 3.8  CL 106 105 99  CO2 21 21 21   GLUCOSE 114* 123* 105*  BUN 10 13 14   CREATININE 0.89 0.97 1.13  CALCIUM 7.8* 8.0* 8.6   LFT  Basename 05/10/12 0333  PROT 5.3*  ALBUMIN 1.9*  AST 12  ALT 16  ALKPHOS 64  BILITOT 0.4  BILIDIR --  IBILI --   PT/INR  Basename 05/09/12 1315  LABPROT 14.6  INR 1.12    Studies/Results: Dg Abd 2 Views  20-May-2012  *RADIOLOGY REPORT*  Clinical Data: Lower abdominal tightness.  ABDOMEN - 2 VIEW  Comparison: Two views the abdomen 05/10/2012.  CT abdomen and pelvis 05/08/2012.  Findings:  Scattered air-fluid levels in small and large bowel are again seen.  Contrast material is again noted within the colon. Free intraperitoneal air seen on comparison CT scan is not well demonstrated on this study.  Linear radiopaque structure projecting the lower chest is noted.  There is basilar atelectasis.  IMPRESSION:  1.  Free intraperitoneal air seen on  prior CT is not well demonstrated on this examination. 2.  No change in bowel gas pattern with scattered air-fluid levels again seen. 3.  Radiopaque structure projecting in the lower chest may be external to the patient.  If it is a feeding tube, its tip is in the distal esophagus.  Original Report Authenticated By: Bernadene Bell. Maricela Curet, M.D.   Dg Abd 2 Views  05/10/2012  *RADIOLOGY REPORT*  Clinical Data: Abdominal pain.  Free air.  ABDOMEN - 2 VIEW  Comparison: 05/08/2012 CT.  Findings: The CT detected of free intraperitoneal air is not well delineated below the right hemidiaphragm.  On the upright view, there may be loculated free air below the left hemidiaphragm although it is possible this is related to air within the stomach.  Nonspecific bowel gas pattern with residual contrast within colon. Descending colon and sigmoid colon diverticula with incomplete distension and fold/bowel wall thickening.  Gas filled small bowel loops with thickened folds measuring up to 2.9 cm.  IMPRESSION: CT detected free intraperitoneal air is not as well delineated on the present plain film examination.  Abnormal bowel gas pattern with small bowel loops having thickened folds and descending colon under distension with wall thickening/diverticula.  Please see prior CT report.  Original Report Authenticated By: Fuller Canada, M.D.    ASSESSMENT / PLAN:  1. Crohn's ileocolitis, recently diagnosed. Admitted with bowel perforation.  Tmax 99.5,  WBC has risen to 16 today. His abdominal pain is improving. He hasn't had any pain meds since 1am. He is now able to cough aggressively without significant abdominal pain.  Low grade temp and leukocytosis could be due in part to IBD. Abdominal films today don't demonstrate free air. Continue IV antibiotics, NPO. Surgery following. Recheck CBC, BMET in am.   2. Cough, productive. He hasn't been able to cough for a week secondary to pain. He asks for supplemental oxygen. Will check  oxygen sat and start oxygen if indicated. Start incentive spirometry.    3. Cirrhosis by CTscan. He does drink ETOH. We have discussed need to discontinue ETOH.   4. Questionable pancreatic divisum on CTscan   5. ETOH abuse. We started CIWA protocol yesterday but he isn't taking Librium or Ativan. Encouraged him to comply.   6.  Hypokalemia, mild. Will add K+ to IVF    LOS: 3 days   Willette Cluster  05/11/2012, 9:02 AM   ________________________________________________________________________  [facility] MD note:  I personally examined the patient, reviewed the data and agree with the assessment and plan described above.  Slowly improving on IV abx.  Appreciate surgery input.     Rob Bunting, MD [facility] Pager [phone]

## 2012-05-12 LAB — CBC
MCHC: 33.6 g/dL (ref 30.0–36.0)
MCV: 88.1 fL (ref 78.0–100.0)
Platelets: 342 10*3/uL (ref 150–400)
RDW: 14.8 % (ref 11.5–15.5)
WBC: 15.7 10*3/uL — ABNORMAL HIGH (ref 4.0–10.5)

## 2012-05-12 LAB — BASIC METABOLIC PANEL
Calcium: 7.5 mg/dL — ABNORMAL LOW (ref 8.4–10.5)
Chloride: 108 mEq/L (ref 96–112)
Creatinine, Ser: 0.87 mg/dL (ref 0.50–1.35)
GFR calc Af Amer: >90 mL/min (ref >90)
GFR calc non Af Amer: >90 mL/min (ref >90)

## 2012-05-12 MED ORDER — POTASSIUM CHLORIDE 2 MEQ/ML IV SOLN
INTRAVENOUS | Status: DC
  Administered 2012-05-12: 11:00:00 via INTRAVENOUS
  Filled 2012-05-12 (×8): qty 1000

## 2012-05-12 MED ORDER — POTASSIUM CHLORIDE 2 MEQ/ML IV SOLN: INTRAVENOUS | Status: DC

## 2012-05-12 MED ORDER — HYDROCODONE-ACETAMINOPHEN 5-325 MG PO TABS
1.0000 | ORAL_TABLET | Freq: Four times a day (QID) | ORAL | Status: DC | PRN
  Administered 2012-05-12: 1 via ORAL
  Administered 2012-05-12 – 2012-05-13 (×2): 2 via ORAL
  Filled 2012-05-12: qty 2
  Filled 2012-05-12: qty 1
  Filled 2012-05-12 (×2): qty 2

## 2012-05-12 NOTE — Progress Notes (Signed)
[facility] Progress Note    Subjective: Started clear liquids yesterday. Tolerates without N/vomiting.  Moving his bowels, mainly liquid stools.  Still in pain lower abd.  Objective: Vital signs in last 24 hours: Temp:  [97.8 F (36.6 C)-100.8 F (38.2 C)] 98.4 F (36.9 C) (06/14 0440) Pulse Rate:  [73-75] 73  (06/14 0440) Resp:  [17-18] 18  (06/14 0440) BP: (95-105)/(57-63) 95/57 mmHg (06/14 0440) SpO2:  [92 %-96 %] 92 % (06/14 0440) Last BM Date: 05/11/12 General: alert and oriented times 3 Heart: regular rate and rythm Abdomen: soft, moderately tender especially low abd, non-distended, normal bowel sounds   Lab Results:  Basename 05/12/12 0315 05/11/12 0327 05/10/12 0333  WBC 15.7* 16.7* 12.0*  HGB 11.4* 12.0* 12.1*  PLT 342 286 257  MCV 88.1 87.9 87.8    Basename 05/12/12 0315 05/11/12 0327 05/10/12 0333  NA 137 138 136  K 3.0* 3.3* 3.4*  CL 108 106 105  CO2 20 21 21   GLUCOSE 91 114* 123*  BUN 9 10 13   CREATININE 0.87 0.89 0.97  CALCIUM 7.5* 7.8* 8.0*    Basename 05/10/12 0333  PROT 5.3*  ALBUMIN 1.9*  AST 12  ALT 16  ALKPHOS 64  BILITOT 0.4  BILIDIR --  IBILI --    Basename 05/09/12 1315  INR 1.12     Medications: Scheduled Meds:   . chlordiazePOXIDE  5 mg Oral TID  . ciprofloxacin  400 mg Intravenous Q12H  . enoxaparin  40 mg Subcutaneous Q24H  . folic acid  1 mg Oral Daily  . LORazepam  0-4 mg Intravenous Q6H   Followed by  . LORazepam  0-4 mg Intravenous Q12H  . metronidazole  500 mg Intravenous Q6H  . multivitamin with minerals  1 tablet Oral Daily  . thiamine  100 mg Oral Daily   Or  . thiamine  100 mg Intravenous Daily   Continuous Infusions:   . 0.9 % NaCl with KCl 20 mEq / L 100 mL/hr at 05/11/12 1344  . dextrose 5 % and 0.9% NaCl 125 mL/hr at 05/11/12 1142   PRN Meds:.alum & mag hydroxide-simeth, HYDROmorphone (DILAUDID) injection, LORazepam, LORazepam, ondansetron (ZOFRAN) IV, ondansetron,  zolpidem    Assessment/Plan: 56 y.o. male with small amt free air, likely from underlying crohn's related perf.  Improving on Iv Abx.  No steroids.  He was getting 2 different IV fluids, I will combine.  He wants to try oral pain meds, I will order.  Still in pain, still pretty tender and so will not advance diet past clears for now. BMET in am.  Appreciate surgery input.   Rob Bunting, MD  05/12/2012, 9:20 AM [facility] Pager [phone]

## 2012-05-12 NOTE — Progress Notes (Signed)
Subjective: Not better , not really worse.  More distended, less tender.  Objective: Vital signs in last 24 hours: Temp:  [97.8 F (36.6 C)-100.8 F (38.2 C)] 98.4 F (36.9 C) (06/14 0440) Pulse Rate:  [73-75] 73  (06/14 0440) Resp:  [17-18] 18  (06/14 0440) BP: (95-105)/(57-63) 95/57 mmHg (06/14 0440) SpO2:  [92 %-96 %] 92 % (06/14 0440) Last BM Date: 05/12/12  +BM, 850 ml po yesterday and 450 ml since MN recorded. TM 100.8 1900 hrs yesterday, none before or after. WBC is still up 15.7.  Intake/Output from previous day: 06-02-23 0701 - 06/14 0700 In: 4761.3 [P.O.:850; I.V.:3811.3; IV Piggyback:100] Out: -  Intake/Output this shift: Total I/O In: 450 [P.O.:450] Out: -   General appearance: alert, cooperative and no distress GI: he feels and i think he's more distended, but much less tender.  +BS, some flatus and he's had a couple BM's last 2 day.  Lab Results:   Samuel Montgomery 05/12/12 0315 2012/06/01 0327  WBC 15.7* 16.7*  HGB 11.4* 12.0*  HCT 33.9* 34.9*  PLT 342 286    BMET  Basename 05/12/12 0315 Jun 01, 2012 0327  NA 137 138  K 3.0* 3.3*  CL 108 106  CO2 20 21  GLUCOSE 91 114*  BUN 9 10  CREATININE 0.87 0.89  CALCIUM 7.5* 7.8*   PT/INR  Basename 05/09/12 1315  LABPROT 14.6  INR 1.12     Lab 05/10/12 0333 05/08/12 1628  AST 12 25  ALT 16 28  ALKPHOS 64 79  BILITOT 0.4 0.4  PROT 5.3* 6.4  ALBUMIN 1.9* 2.5*     Lipase  No results found for this basename: lipase     Studies/Results: Dg Abd 2 Views  June 01, 2012  *RADIOLOGY REPORT*  Clinical Data: Lower abdominal tightness.  ABDOMEN - 2 VIEW  Comparison: Two views the abdomen 05/10/2012.  CT abdomen and pelvis 05/08/2012.  Findings:  Scattered air-fluid levels in small and large bowel are again seen.  Contrast material is again noted within the colon. Free intraperitoneal air seen on comparison CT scan is not well demonstrated on this study.  Linear radiopaque structure projecting the lower chest is noted.   There is basilar atelectasis.  IMPRESSION:  1.  Free intraperitoneal air seen on prior CT is not well demonstrated on this examination. 2.  No change in bowel gas pattern with scattered air-fluid levels again seen. 3.  Radiopaque structure projecting in the lower chest may be external to the patient.  If it is a feeding tube, its tip is in the distal esophagus.  Original Report Authenticated By: Bernadene Bell. Maricela Curet, M.D.    Medications:    . chlordiazePOXIDE  5 mg Oral TID  . ciprofloxacin  400 mg Intravenous Q12H  . enoxaparin  40 mg Subcutaneous Q24H  . folic acid  1 mg Oral Daily  . LORazepam  0-4 mg Intravenous Q6H   Followed by  . LORazepam  0-4 mg Intravenous Q12H  . metronidazole  500 mg Intravenous Q6H  . multivitamin with minerals  1 tablet Oral Daily  . thiamine  100 mg Oral Daily   Or  . thiamine  100 mg Intravenous Daily    Assessment/Plan . Abdominal pain with free intraperitoneal air from undetermined source - ? Crohn's, ? Diverticular disease, ? PUD Crohn's ileocolitis,, with free air; perforation vs early fistula, no drainable abscess  Cirrhosis by CT/hx of ETOH use 1/2 gal bourbon/wk.  Tobacco use, 1.5 PPD since teen years/COPD  MJ use  Bilateral inguinal hernias  Hx gerd  hX OF psoriasis   He's not really worse, but I'm not sure with the distension, just on clears he's that much better.I agree he's not ready to go beyond clears.  He had his 1st CT on 05/08/12, we may need to repeat to see if there is any measurable change.     LOS: 4 days    Samuel Montgomery 05/12/2012

## 2012-05-12 NOTE — Progress Notes (Signed)
General Surgery [facility] - Attending  Patient seen and examined.  Slight overall improvement.  WBC slightly lower.  Tolerating CL's.  Encouraged to ambulate in halls.  Will check CBC in AM.  Continue to monitor with GI service.  If clinically deteriorates, will need laparotomy.  Velora Heckler, MD, [facility] Office: [phone]

## 2012-05-13 ENCOUNTER — Inpatient Hospital Stay (HOSPITAL_COMMUNITY): Payer: BC Managed Care – PPO

## 2012-05-13 LAB — BASIC METABOLIC PANEL
CO2: 23 mEq/L (ref 19–32)
Calcium: 7.6 mg/dL — ABNORMAL LOW (ref 8.4–10.5)
GFR calc Af Amer: >90 mL/min (ref >90)
GFR calc non Af Amer: >90 mL/min (ref >90)
Sodium: 135 mEq/L (ref 135–145)

## 2012-05-13 LAB — DIFFERENTIAL
Basophils Absolute: 0 10*3/uL (ref 0.0–0.1)
Eosinophils Relative: 2 % (ref 0–5)
Lymphocytes Relative: 8 % — ABNORMAL LOW (ref 12–46)
Monocytes Relative: 9 % (ref 3–12)
Neutrophils Relative %: 81 % — ABNORMAL HIGH (ref 43–77)

## 2012-05-13 LAB — CBC
Platelets: 423 10*3/uL — ABNORMAL HIGH (ref 150–400)
RBC: 4 MIL/uL — ABNORMAL LOW (ref 4.22–5.81)
RDW: 14.7 % (ref 11.5–15.5)
WBC: 18.2 10*3/uL — ABNORMAL HIGH (ref 4.0–10.5)

## 2012-05-13 MED ORDER — IOHEXOL 300 MG/ML  SOLN
100.0000 mL | Freq: Once | INTRAMUSCULAR | Status: AC | PRN
  Administered 2012-05-13: 100 mL via INTRAVENOUS

## 2012-05-13 MED ORDER — BOOST / RESOURCE BREEZE PO LIQD
1.0000 | Freq: Three times a day (TID) | ORAL | Status: DC
  Administered 2012-05-13: 1 via ORAL

## 2012-05-13 MED ORDER — ALVIMOPAN 12 MG PO CAPS
12.0000 mg | ORAL_CAPSULE | Freq: Once | ORAL | Status: DC
  Filled 2012-05-13 (×2): qty 1

## 2012-05-13 MED ORDER — ALVIMOPAN 12 MG PO CAPS
12.0000 mg | ORAL_CAPSULE | Freq: Once | ORAL | Status: DC
  Filled 2012-05-13: qty 1

## 2012-05-13 NOTE — Progress Notes (Addendum)
Patient ID: Samuel Montgomery, male   DOB: 1956/04/19, 56 y.o.   MRN: [id number]  General Surgery - [facility] - Progress Note  Subjective: Patient comfortable, no nausea.  Less pain.  Small BM's.  Tolerating liquids.  Objective: Vital signs in last 24 hours: Temp:  [98.4 F (36.9 C)-100.4 F (38 C)] 100.1 F (37.8 C) (06/15 0440) Pulse Rate:  [64-74] 74  (06/15 0440) Resp:  [16-18] 16  (06/15 0440) BP: (96-104)/(50-63) 99/63 mmHg (06/15 0440) SpO2:  [93 %-98 %] 94 % (06/15 0440) Last BM Date: 05/12/12  Intake/Output from previous day: 06/14 0701 - 06/15 0700 In: 2361.7 [P.O.:1505; I.V.:156.7; IV Piggyback:100] Out: -   Exam: HEENT - clear, not icteric Neck - soft Chest - clear bilaterally Cor - RRR, no murmur Abd - markedly distended, tense; BS present; mild tenderness RLQ Ext - no significant edema Neuro - grossly intact, no focal deficits  Lab Results:   Basename 05/13/12 0402 05/12/12 0315  WBC 18.2* 15.7*  HGB 11.8* 11.4*  HCT 35.4* 33.9*  PLT 423* 342     Basename 05/13/12 0402 05/12/12 0315  NA 135 137  K 3.5 3.0*  CL 105 108  CO2 23 20  GLUCOSE 109* 91  BUN 7 9  CREATININE 0.88 0.87  CALCIUM 7.6* 7.5*    Studies/Results: No results found.  Assessment / Plan: 1.  Abdominal pain, small free air - ? Crohn's fistula / perforation  - empiric abx's  - clear liquid diet  - GI following too  - agree with plans for repeat CT abdomen today given slow course, elevated WBC today - will review  Velora Heckler, MD, [facility] Office: [phone]  05/13/2012   ADDENDUM: CT scan reviewed and discussed with Dr. Christella Hartigan.  Patient will require exploration with resection of terminal ileum with or without anastomosis, drainage of abscesses.  Patient has eaten today.  Will keep NPO after midnight and post for OR in AM 05/14/2012. Velora Heckler, MD, [facility] Office: [phone]

## 2012-05-13 NOTE — Progress Notes (Signed)
The patient is concerned with his urine as it is a tea color and he is experiencing a burning sensation while voiding.

## 2012-05-13 NOTE — Progress Notes (Signed)
Patient ID: Samuel Montgomery, male   DOB: 07/02/1956, 56 y.o.   MRN: [id number] [facility] Progress Note  Subjective: Feels much better than he did on admit.but weak-hasn't eaten for several days. He also c/o abdominal distension.has had some small bm's  Objective:  Vital signs in last 24 hours: Temp:  [98.4 F (36.9 C)-100.4 F (38 C)] 100.1 F (37.8 C) (06/15 0440) Pulse Rate:  [64-74] 74  (06/15 0440) Resp:  [16-18] 16  (06/15 0440) BP: (96-104)/(50-63) 99/63 mmHg (06/15 0440) SpO2:  [93 %-98 %] 94 % (06/15 0440) Last BM Date: 05/12/12 General:   Alert,  Well-developed,    in NAD Heart:  Regular rate and rhythm; no murmurs Pulm;clear Abdomen:  Quite distended , rather diffusely tender, more marked RLQ ,no true rebound,Bs quiet  Neurologic:  Alert and  oriented x4;  grossly normal neurologically. Psych:  Alert and cooperative. Normal mood and affect.  Intake/Output from previous day: 06/14 0701 - 06/15 0700 In: 2361.7 [P.O.:1505; I.V.:156.7; IV Piggyback:100] Out: -  Intake/Output this shift: Total I/O In: 2702 [I.V.:2702] Out: -   Lab Results:  Basename 05/13/12 0402 05/12/12 0315 05/11/12 0327  WBC 18.2* 15.7* 16.7*  HGB 11.8* 11.4* 12.0*  HCT 35.4* 33.9* 34.9*  PLT 423* 342 286   BMET  Basename 05/13/12 0402 05/12/12 0315 05/11/12 0327  NA 135 137 138  K 3.5 3.0* 3.3*  CL 105 108 106  CO2 23 20 21   GLUCOSE 109* 91 114*  BUN 7 9 10   CREATININE 0.88 0.87 0.89  CALCIUM 7.6* 7.5* 7.8*   LFT No results found for this basename: PROT,ALBUMIN,AST,ALT,ALKPHOS,BILITOT,BILIDIR,IBILI in the last 72 hours PT/INR No results found for this basename: LABPROT:2,INR:2 in the last 72 hours Hepatitis Panel No results found for this basename: HEPBSAG,HCVAB,HEPAIGM,HEPBIGM in the last 72 hours  Assessment / Plan: 56 yo male with new dx of Crohns ileitis ,now with ?contained perforation. WBC not trending down and he is more distended. Will discuss timing of f/u CT  -discussed with Dr. Payton Emerald proceed with CT today Continue Cipr0 and FLagyl IV -day # 5 #2 nutrition- start  resourceBreeze- await  CT then decide if can push po's Active Problems:  * No active hospital problems. *      LOS: 5 days   Amy Esterwood  05/13/2012, 8:47 AM    ________________________________________________________________________  [facility] MD note:  I personally examined the patient, reviewed the data and agree with the assessment and plan described above.  Will check CT.   Rob Bunting, MD [facility] Pager [phone]

## 2012-05-14 ENCOUNTER — Encounter (HOSPITAL_COMMUNITY): Payer: Self-pay | Admitting: Anesthesiology

## 2012-05-14 ENCOUNTER — Encounter (HOSPITAL_COMMUNITY)
Admission: AD | Disposition: A | Discharge status: Home Health | Payer: Self-pay | Source: Ambulatory Visit | Attending: Internal Medicine

## 2012-05-14 ENCOUNTER — Inpatient Hospital Stay (HOSPITAL_COMMUNITY): Payer: BC Managed Care – PPO | Admitting: Anesthesiology

## 2012-05-14 DIAGNOSIS — K651 Peritoneal abscess: Secondary | ICD-10-CM

## 2012-05-14 HISTORY — PX: BOWEL RESECTION: SHX1257

## 2012-05-14 HISTORY — PX: LAPAROTOMY: SHX154

## 2012-05-14 LAB — CBC
Hemoglobin: 13 g/dL (ref 13.0–17.0)
MCHC: 34.1 g/dL (ref 30.0–36.0)
RBC: 4.3 MIL/uL (ref 4.22–5.81)
WBC: 27.2 10*3/uL — ABNORMAL HIGH (ref 4.0–10.5)

## 2012-05-14 LAB — SURGICAL PCR SCREEN
MRSA, PCR: NEGATIVE
Staphylococcus aureus: POSITIVE — AB

## 2012-05-14 SURGERY — LAPAROTOMY, EXPLORATORY
Anesthesia: General | Site: Abdomen | Wound class: Contaminated

## 2012-05-14 MED ORDER — FENTANYL CITRATE 0.05 MG/ML IJ SOLN
INTRAMUSCULAR | Status: DC | PRN
  Administered 2012-05-14: 50 ug via INTRAVENOUS
  Administered 2012-05-14: 100 ug via INTRAVENOUS
  Administered 2012-05-14 (×2): 50 ug via INTRAVENOUS

## 2012-05-14 MED ORDER — LACTATED RINGERS IV SOLN
INTRAVENOUS | Status: DC | PRN
  Administered 2012-05-14: 11:00:00 via INTRAVENOUS

## 2012-05-14 MED ORDER — MIDAZOLAM HCL 5 MG/5ML IJ SOLN
INTRAMUSCULAR | Status: DC | PRN
  Administered 2012-05-14: 2 mg via INTRAVENOUS

## 2012-05-14 MED ORDER — SODIUM CHLORIDE 0.9 % IR SOLN
Status: DC | PRN
  Administered 2012-05-14: 4000 mL

## 2012-05-14 MED ORDER — PROMETHAZINE HCL 25 MG/ML IJ SOLN
6.2500 mg | INTRAMUSCULAR | Status: DC | PRN
  Administered 2012-05-14: 6.25 mg via INTRAVENOUS

## 2012-05-14 MED ORDER — NEOSTIGMINE METHYLSULFATE 1 MG/ML IJ SOLN
INTRAMUSCULAR | Status: DC | PRN
  Administered 2012-05-14: 3.5 mg via INTRAVENOUS

## 2012-05-14 MED ORDER — HYDROMORPHONE HCL PF 1 MG/ML IJ SOLN: 0.2500 mg | INTRAMUSCULAR | Status: DC | PRN

## 2012-05-14 MED ORDER — SODIUM CHLORIDE 0.9 % IV SOLN
INTRAVENOUS | Status: DC | PRN
  Administered 2012-05-14: 10:00:00 via INTRAVENOUS

## 2012-05-14 MED ORDER — DIPHENHYDRAMINE HCL 12.5 MG/5ML PO ELIX: 12.5000 mg | ORAL_SOLUTION | Freq: Four times a day (QID) | ORAL | Status: DC | PRN

## 2012-05-14 MED ORDER — NALOXONE HCL 0.4 MG/ML IJ SOLN: 0.4000 mg | INTRAMUSCULAR | Status: DC | PRN

## 2012-05-14 MED ORDER — LACTATED RINGERS IV SOLN: INTRAVENOUS | Status: DC

## 2012-05-14 MED ORDER — PROPOFOL 10 MG/ML IV EMUL
INTRAVENOUS | Status: DC | PRN
  Administered 2012-05-14: 150 mg via INTRAVENOUS

## 2012-05-14 MED ORDER — DIPHENHYDRAMINE HCL 50 MG/ML IJ SOLN
12.5000 mg | Freq: Four times a day (QID) | INTRAMUSCULAR | Status: DC | PRN
  Administered 2012-05-15: 12.5 mg via INTRAVENOUS
  Filled 2012-05-14: qty 1

## 2012-05-14 MED ORDER — HYDROMORPHONE HCL PF 1 MG/ML IJ SOLN
INTRAMUSCULAR | Status: DC | PRN
  Administered 2012-05-14 (×4): 1 mg via INTRAVENOUS

## 2012-05-14 MED ORDER — LACTATED RINGERS IV SOLN
INTRAVENOUS | Status: DC | PRN
  Administered 2012-05-14 (×2): via INTRAVENOUS

## 2012-05-14 MED ORDER — HYDROMORPHONE 0.3 MG/ML IV SOLN
INTRAVENOUS | Status: AC
  Filled 2012-05-14: qty 25

## 2012-05-14 MED ORDER — ONDANSETRON HCL 4 MG/2ML IJ SOLN: 4.0000 mg | Freq: Four times a day (QID) | INTRAMUSCULAR | Status: DC | PRN

## 2012-05-14 MED ORDER — KCL IN DEXTROSE-NACL 20-5-0.45 MEQ/L-%-% IV SOLN
INTRAVENOUS | Status: AC
  Filled 2012-05-14: qty 1000

## 2012-05-14 MED ORDER — ROCURONIUM BROMIDE 100 MG/10ML IV SOLN
INTRAVENOUS | Status: DC | PRN
  Administered 2012-05-14 (×2): 10 mg via INTRAVENOUS
  Administered 2012-05-14: 30 mg via INTRAVENOUS

## 2012-05-14 MED ORDER — ONDANSETRON HCL 4 MG/2ML IJ SOLN
INTRAMUSCULAR | Status: DC | PRN
  Administered 2012-05-14: 4 mg via INTRAVENOUS

## 2012-05-14 MED ORDER — CIPROFLOXACIN IN D5W 400 MG/200ML IV SOLN: 400.0000 mg | Freq: Two times a day (BID) | INTRAVENOUS | Status: DC

## 2012-05-14 MED ORDER — ALVIMOPAN 12 MG PO CAPS
12.0000 mg | ORAL_CAPSULE | Freq: Two times a day (BID) | ORAL | Status: DC
  Administered 2012-05-15 – 2012-05-18 (×7): 12 mg via ORAL
  Filled 2012-05-14 (×8): qty 1

## 2012-05-14 MED ORDER — HYDROMORPHONE 0.3 MG/ML IV SOLN
INTRAVENOUS | Status: DC
  Administered 2012-05-14: 2.4 mg via INTRAVENOUS
  Administered 2012-05-14: 0.3 mg via INTRAVENOUS
  Administered 2012-05-14: via INTRAVENOUS
  Administered 2012-05-15: 4.8 mg via INTRAVENOUS
  Administered 2012-05-15: 0.9 mg via INTRAVENOUS
  Administered 2012-05-15: 1.5 mg via INTRAVENOUS
  Administered 2012-05-15 – 2012-05-16 (×2): via INTRAVENOUS
  Filled 2012-05-14 (×3): qty 25

## 2012-05-14 MED ORDER — SODIUM CHLORIDE 0.9 % IJ SOLN: 9.0000 mL | INTRAMUSCULAR | Status: DC | PRN

## 2012-05-14 MED ORDER — METRONIDAZOLE IN NACL 5-0.79 MG/ML-% IV SOLN
500.0000 mg | Freq: Three times a day (TID) | INTRAVENOUS | Status: DC
  Administered 2012-05-14 – 2012-05-21 (×21): 500 mg via INTRAVENOUS
  Filled 2012-05-14 (×22): qty 100

## 2012-05-14 MED ORDER — ONDANSETRON HCL 4 MG/2ML IJ SOLN
4.0000 mg | Freq: Four times a day (QID) | INTRAMUSCULAR | Status: DC | PRN
  Administered 2012-05-15: 4 mg via INTRAVENOUS
  Filled 2012-05-14: qty 2

## 2012-05-14 MED ORDER — KCL IN DEXTROSE-NACL 20-5-0.45 MEQ/L-%-% IV SOLN
INTRAVENOUS | Status: DC
  Administered 2012-05-14: 13:00:00 via INTRAVENOUS
  Administered 2012-05-14: 125 mL/h via INTRAVENOUS
  Administered 2012-05-16 – 2012-05-18 (×4): via INTRAVENOUS
  Filled 2012-05-14 (×14): qty 1000

## 2012-05-14 MED ORDER — SUCCINYLCHOLINE CHLORIDE 20 MG/ML IJ SOLN
INTRAMUSCULAR | Status: DC | PRN
  Administered 2012-05-14: 120 mg via INTRAVENOUS

## 2012-05-14 MED ORDER — ACETAMINOPHEN 10 MG/ML IV SOLN
INTRAVENOUS | Status: DC | PRN
  Administered 2012-05-14: 1000 mg via INTRAVENOUS

## 2012-05-14 MED ORDER — GLYCOPYRROLATE 0.2 MG/ML IJ SOLN
INTRAMUSCULAR | Status: DC | PRN
  Administered 2012-05-14: .5 mg via INTRAVENOUS

## 2012-05-14 MED ORDER — ALVIMOPAN 12 MG PO CAPS
12.0000 mg | ORAL_CAPSULE | Freq: Once | ORAL | Status: AC
  Administered 2012-05-14: 12 mg via ORAL
  Filled 2012-05-14: qty 1

## 2012-05-14 MED ORDER — LIDOCAINE HCL (CARDIAC) 20 MG/ML IV SOLN
INTRAVENOUS | Status: DC | PRN
  Administered 2012-05-14: 60 mg via INTRAVENOUS

## 2012-05-14 MED ORDER — ONDANSETRON HCL 4 MG PO TABS: 4.0000 mg | ORAL_TABLET | Freq: Four times a day (QID) | ORAL | Status: DC | PRN

## 2012-05-14 SURGICAL SUPPLY — 43 items
APPLICATOR COTTON TIP 6IN STRL (MISCELLANEOUS) ×2 IMPLANT
BLADE EXTENDED COATED 6.5IN (ELECTRODE) ×1 IMPLANT
BLADE HEX COATED 2.75 (ELECTRODE) ×2 IMPLANT
CANISTER SUCTION 2500CC (MISCELLANEOUS) ×2 IMPLANT
CLOTH BEACON ORANGE TIMEOUT ST (SAFETY) ×2 IMPLANT
COVER MAYO STAND STRL (DRAPES) ×1 IMPLANT
DRAPE LAPAROSCOPIC ABDOMINAL (DRAPES) ×2 IMPLANT
DRAPE WARM FLUID 44X44 (DRAPE) ×1 IMPLANT
DRSG PAD ABDOMINAL 8X10 ST (GAUZE/BANDAGES/DRESSINGS) ×1 IMPLANT
ELECT REM PT RETURN 9FT ADLT (ELECTROSURGICAL) ×2
ELECTRODE REM PT RTRN 9FT ADLT (ELECTROSURGICAL) ×1 IMPLANT
GLOVE BIOGEL PI IND STRL 7.0 (GLOVE) ×1 IMPLANT
GLOVE BIOGEL PI INDICATOR 7.0 (GLOVE) ×1
GLOVE SURG ORTHO 8.0 STRL STRW (GLOVE) ×3 IMPLANT
GOWN STRL NON-REIN LRG LVL3 (GOWN DISPOSABLE) ×2 IMPLANT
GOWN STRL REIN XL XLG (GOWN DISPOSABLE) ×4 IMPLANT
KIT BASIN OR (CUSTOM PROCEDURE TRAY) ×2 IMPLANT
LIGASURE IMPACT 36 18CM CVD LR (INSTRUMENTS) ×1 IMPLANT
NS IRRIG 1000ML POUR BTL (IV SOLUTION) ×2 IMPLANT
PACK GENERAL/GYN (CUSTOM PROCEDURE TRAY) ×2 IMPLANT
RELOAD PROXIMATE 75MM BLUE (ENDOMECHANICALS) ×4 IMPLANT
RELOAD STAPLE 75 3.8 BLU REG (ENDOMECHANICALS) IMPLANT
SPONGE GAUZE 4X4 12PLY (GAUZE/BANDAGES/DRESSINGS) ×2 IMPLANT
SPONGE LAP 18X18 X RAY DECT (DISPOSABLE) ×1 IMPLANT
STAPLER GUN LINEAR PROX 60 (STAPLE) ×1 IMPLANT
STAPLER PROXIMATE 75MM BLUE (STAPLE) ×1 IMPLANT
STAPLER VISISTAT 35W (STAPLE) ×2 IMPLANT
SUCTION POOLE TIP (SUCTIONS) ×1 IMPLANT
SUT NOV 1 T60/GS (SUTURE) IMPLANT
SUT NOVA NAB DX-16 0-1 5-0 T12 (SUTURE) ×4 IMPLANT
SUT SILK 2 0 (SUTURE)
SUT SILK 2 0 SH CR/8 (SUTURE) ×1 IMPLANT
SUT SILK 2-0 18XBRD TIE 12 (SUTURE) IMPLANT
SUT SILK 3 0 (SUTURE)
SUT SILK 3 0 SH CR/8 (SUTURE) ×1 IMPLANT
SUT SILK 3-0 18XBRD TIE 12 (SUTURE) IMPLANT
SUT VICRYL 2 0 18  UND BR (SUTURE)
SUT VICRYL 2 0 18 UND BR (SUTURE) IMPLANT
TAPE CLOTH SURG 4X10 WHT LF (GAUZE/BANDAGES/DRESSINGS) ×1 IMPLANT
TOWEL OR 17X26 10 PK STRL BLUE (TOWEL DISPOSABLE) ×4 IMPLANT
TRAY FOLEY CATH 14FRSI W/METER (CATHETERS) ×1 IMPLANT
WATER STERILE IRR 1500ML POUR (IV SOLUTION) ×2 IMPLANT
YANKAUER SUCT BULB TIP NO VENT (SUCTIONS) ×1 IMPLANT

## 2012-05-14 NOTE — Preoperative (Signed)
Beta Blockers   Reason not to administer Beta Blockers:Not Applicable 

## 2012-05-14 NOTE — Brief Op Note (Signed)
05/08/2012 - 05/14/2012  11:53 AM  PATIENT:  Samuel Montgomery  56 y.o. male  PRE-OPERATIVE DIAGNOSIS:  Crohn's ileitis with perforation and abscess formation  POST-OPERATIVE DIAGNOSIS:  Same  PROCEDURE:  1. Exploratory laparotomy  2. Ileocecectomy with primary anastomosis  3.  Drainage of intra-abdominal abscesses  SURGEON:  Surgeon(s) and Role:    * Velora Heckler, MD - Primary    * Valarie Merino, MD - Assisting  ANESTHESIA:   general  EBL:  Total I/O In: 2000 [I.V.:2000] Out: 2025 [Urine:475; Other:1500; Blood:50]  BLOOD ADMINISTERED:none  DRAINS: none   LOCAL MEDICATIONS USED:  NONE  SPECIMEN:  Excision  DISPOSITION OF SPECIMEN:  PATHOLOGY  COUNTS:  YES  TOURNIQUET:  * No tourniquets in log *  DICTATION: .Other Dictation: Dictation Number [id number]  PLAN OF CARE: Admit to inpatient   PATIENT DISPOSITION:  PACU - hemodynamically stable.   Delay start of Pharmacological VTE agent (>24hrs) due to surgical blood loss or risk of bleeding: yes  Velora Heckler, MD, [facility] Office: [phone]

## 2012-05-14 NOTE — Anesthesia Preprocedure Evaluation (Addendum)
Anesthesia Evaluation  Patient identified by MRN, date of birth, ID band Patient awake    Reviewed: Allergy & Precautions, H&P , NPO status , Patient's Chart, lab work & pertinent test results  Airway Mallampati: II TM Distance: >3 FB Neck ROM: Full    Dental  (+) Teeth Intact and Dental Advisory Given   Pulmonary COPDCurrent Smoker,    Pulmonary exam normal       Cardiovascular negative cardio ROS  Rhythm:Regular Rate:Normal     Neuro/Psych negative neurological ROS  negative psych ROS   GI/Hepatic GERD-  ,(+)     substance abuse (16 oz ETOH per day)  alcohol use, Crohns    Endo/Other  negative endocrine ROS  Renal/GU negative Renal ROS  negative genitourinary   Musculoskeletal negative musculoskeletal ROS (+)   Abdominal   Peds  Hematology negative hematology ROS (+)   Anesthesia Other Findings   Reproductive/Obstetrics negative OB ROS                           Anesthesia Physical Anesthesia Plan  ASA: II and Emergent  Anesthesia Plan: General   Post-op Pain Management:    Induction: Intravenous, Rapid sequence and Cricoid pressure planned  Airway Management Planned: Oral ETT  Additional Equipment:   Intra-op Plan:   Post-operative Plan: Extubation in OR  Informed Consent: I have reviewed the patients History and Physical, chart, labs and discussed the procedure including the risks, benefits and alternatives for the proposed anesthesia with the patient or authorized representative who has indicated his/her understanding and acceptance.   Dental advisory given  Plan Discussed with: CRNA  Anesthesia Plan Comments:         Anesthesia Quick Evaluation

## 2012-05-14 NOTE — Interval H&P Note (Signed)
History and Physical Interval Note:  05/14/2012 7:49 AM  Samuel Montgomery  has presented today for surgery, with the diagnosis of Crohn's disease with perforation and abscesses.  The various methods of treatment have been discussed with the patient and family. After consideration of risks, benefits and other options for treatment, the patient has consented to    Procedure(s) (LRB): EXPLORATORY LAPAROTOMY (N/A) SMALL BOWEL RESECTION (N/A), ileocecectomy, drainage of abscesses, possible ileostomy as a surgical intervention .    The patients' history has been reviewed, patient examined, no change in status, stable for surgery.  I have reviewed the patients' chart and labs.  Questions were answered to the patient's satisfaction.    Velora Heckler, MD, [facility] Office: [phone]   Kenny Rea Judie Petit

## 2012-05-14 NOTE — Anesthesia Postprocedure Evaluation (Signed)
Anesthesia Post Note  Patient: Samuel Montgomery  Procedure(s) Performed: Procedure(s) (LRB): EXPLORATORY LAPAROTOMY (N/A) SMALL BOWEL RESECTION (N/A)  Anesthesia type: General  Patient location: PACU  Post pain: Pain level controlled  Post assessment: Post-op Vital signs reviewed  Last Vitals:  Filed Vitals:   05/14/12 0536  BP: 101/52  Pulse: 64  Temp: 36.9 C  Resp: 20    Post vital signs: Reviewed  Level of consciousness: sedated  Complications: No apparent anesthesia complications

## 2012-05-14 NOTE — Progress Notes (Signed)
I saw him in pre-op in the OR, he is heading for surgery to repair perf in distal ileum.

## 2012-05-14 NOTE — Op Note (Signed)
NAMECHALMERS, IDDINGS NO.:  [id number]  MEDICAL RECORD NO.:  [id number]  LOCATION:  1303                         FACILITY:  [facility]  PHYSICIAN:  Velora Heckler, MD      DATE OF BIRTH:  Dec 18, 1955  DATE OF PROCEDURE:  05/14/2012                               OPERATIVE REPORT   PREOPERATIVE DIAGNOSES:  Crohn's disease with perforation of terminal ileum, intraabdominal abscesses.  POSTOPERATIVE DIAGNOSES:  same  PROCEDURES: 1. Exploratory laparotomy. 2. Ileocecectomy. 3. Drainage of intraabdominal abscess.  SURGEON:  Velora Heckler, MD, FACS  ASSISTANT:  Thornton Park. Daphine Deutscher, MD, FACS  ANESTHESIA:  General per Dr. Lucille Passy.  ESTIMATED BLOOD LOSS:  100 cc.  PREPARATION:  ChloraPrep.  COMPLICATIONS:  None.  INDICATIONS:  The patient is a 56 year old, white male, with known Crohn's ileocolitis.  The patient was admitted to the Gastroenterology Service on May 08, 2012, with abdominal pain.  CT scan showed a few loculations of air and fluid, worrisome for fistula formation versus perforation. The patient was placed at bowel rest and started on intravenous antibiotics.  He had slow gradual improvement for 5 days.  However,  the patient's white blood cell count became elevated and pain did not completely resolve.  Therefore, a repeat CT scan was carried out on May 13, 2012.  This showed evidence of free extravasation of contrast consistent with perforation and multiple intraabdominal abscesses.  The patient was prepared and is now brought to the operating room for exploration and ileocecectomy.  BODY OF REPORT:  Procedure was done in OR #1 at the [facility].  The patient was brought to the operating room, placed in a supine position on the operating room table.  Following administration of general anesthesia, the patient was positioned and then prepped and draped in usual strict aseptic fashion.  Palpation of the abdomen shows a  large inflammatory mass in the right lower quadrant which was palpable through the abdominal wall.  Midline incision was made with a #10 blade.  Dissection was carried through subcutaneous tissues and hemostasis obtained with electrocautery.  Fascia was incised in the midline and the peritoneal cavity was entered cautiously.  Upon entering the peritoneal cavity, there was a large amount of free fluid.  This measures 500-700 cc, which was evacuated.  There were multiple interloop collections which when broken up show green purulent fluid which was evacuated.  Omentum was adherent to the inflammatory process as well as to the sigmoid colon. The incision was extended slightly.  Omentum was mobilized.  The omentum was transected where it was densely adherent to the cecum and terminal ileum.  It was transected using the LigaSure device for hemostasis.  Small bowel was delivered out of the pelvis or retroperitoneum.  It was followed distally where there was apparent involvement with Crohn ileitis extending to the cecum.  There were dense adhesions in the right lower quadrant to the abdominal wall and retroperitoneum, partially due to chronic inflammation, but also likely due to the patient's previous surgical history of appendectomy.  Sharp dissection was employed to mobilize the lateral wall of the cecum from the abdominal wall.  Right colon  was mobilized along the peritoneal reflection and hepatic flexure was taken down with the electrocautery and used for hemostasis.  This provides good mobilization of the entire right colon.  A point was selected in the distal small bowel, probably 25-30 cm from the ileocecal valve to transect the small bowel above the level of active disease.  This was performed with a GIA stapler.  A point in the ascending colon just above the cecum was selected and also transected with a GIA stapler.  Small bowel and colonic mesentery was divided with the ligature and  reinforced with 2-0 silk figure-of-8 suture ligatures. The specimen was excised in its entirety and submitted to pathology for review.  A side-to-side anastomosis was created between the ileum and the ascending colon.  This was performed with a GIA stapler.  Good hemostasis was noted along the staple line.  No active mucosal disease was identified on inspection.  Enterotomy was closed with a TA-60 stapler.  Anastomosis was tested with gentle pressure and there was no evidence of leakage.  Mesenteric defect was closed with interrupted 2-0 silk sutures.  Gauze were changed.  Abdomen was copiously irrigated with warm saline which was evacuated.  This returned to the peritoneal cavity and covered with the omentum.  Good hemostasis was noted.  All sponge counts were correct.  Midline incision was closed with interrupted #1 Novafil simple sutures. Subcutaneous tissues were irrigated.  Skin was loosely approximated with stainless steel staples and subcutaneous tissues were packed with 4 x 4 gauze sponges saturated with Betadine solution.  Sterile dressings were applied.  The patient was awakened from anesthesia and brought to the recovery room.  The patient tolerated the procedure well.  Velora Heckler, MD, [facility] Office: [phone]  TMG/MEDQ  D:  05/14/2012  T:  05/14/2012  Job:  [id number]  cc:   Iva Boop, MD,FACG [facility] [address] [place], [place] [address]

## 2012-05-14 NOTE — Transfer of Care (Signed)
Immediate Anesthesia Transfer of Care Note  Patient: Samuel Montgomery  Procedure(s) Performed: Procedure(s) (LRB): EXPLORATORY LAPAROTOMY (N/A) SMALL BOWEL RESECTION (N/A)  Patient Location: PACU  Anesthesia Type: General  Level of Consciousness: sedated and patient cooperative  Airway & Oxygen Therapy: Patient Spontanous Breathing and Patient connected to face mask oxygen  Post-op Assessment: Report given to PACU RN, Post -op Vital signs reviewed and stable and Patient moving all extremities  Post vital signs: Reviewed and stable  Complications: No apparent anesthesia complications

## 2012-05-15 ENCOUNTER — Encounter (HOSPITAL_COMMUNITY): Payer: Self-pay | Admitting: Surgery

## 2012-05-15 LAB — BASIC METABOLIC PANEL
Calcium: 7.3 mg/dL — ABNORMAL LOW (ref 8.4–10.5)
GFR calc non Af Amer: >90 mL/min (ref >90)
Sodium: 136 mEq/L (ref 135–145)

## 2012-05-15 LAB — CBC
MCH: 30.1 pg (ref 26.0–34.0)
Platelets: 543 10*3/uL — ABNORMAL HIGH (ref 150–400)
RBC: 4.29 MIL/uL (ref 4.22–5.81)
RDW: 15 % (ref 11.5–15.5)
WBC: 26.7 10*3/uL — ABNORMAL HIGH (ref 4.0–10.5)

## 2012-05-15 MED ORDER — PANTOPRAZOLE SODIUM 40 MG IV SOLR
40.0000 mg | Freq: Two times a day (BID) | INTRAVENOUS | Status: DC
  Administered 2012-05-15 – 2012-05-19 (×10): 40 mg via INTRAVENOUS
  Filled 2012-05-15 (×12): qty 40

## 2012-05-15 MED ORDER — ZOLPIDEM TARTRATE 5 MG PO TABS
5.0000 mg | ORAL_TABLET | Freq: Every evening | ORAL | Status: DC | PRN
  Administered 2012-05-15 – 2012-05-20 (×6): 5 mg via ORAL
  Filled 2012-05-15 (×6): qty 1

## 2012-05-15 NOTE — Progress Notes (Signed)
[facility] Progress Note  SUBJECTIVE: No specific complaints at the moment.   OBJECTIVE:  Vital signs in last 24 hours: Temp:  [97 F (36.1 C)-98 F (36.7 C)] 97.8 F (36.6 C) (06/17 0435) Pulse Rate:  [67-81] 67  (06/17 0435) Resp:  [12-24] 22  (06/17 0745) BP: (95-115)/(55-71) 102/67 mmHg (06/17 0435) SpO2:  [93 %-98 %] 95 % (06/17 0745) Last BM Date: 05/13/12 General:    white male in NAD Heart:  Regular rate and rhythm Lungs: Respirations even and unlabored, lungs CTA bilaterally Abdomen:  Soft, dry surgical dressing. Hypoactive bowel sounds. Extremities:  Trace BLE edema. SCDs on.  Neurologic:  Alert and oriented,  grossly normal neurologically. Psych:  Cooperative. Normal mood and affect.  I Lab Results:  Basename 05/15/12 0345 05/14/12 0404 05/13/12 0402  WBC 26.7* 27.2* 18.2*  HGB 12.9* 13.0 11.8*  HCT 38.2* 38.1* 35.4*  PLT 543* 511* 423*   BMET  Basename 05/15/12 0345 05/13/12 0402  NA 136 135  K 4.3 3.5  CL 106 105  CO2 24 23  GLUCOSE 155* 109*  BUN 5* 7  CREATININE 0.83 0.88  CALCIUM 7.3* 7.6*   Ct Abdomen Pelvis W Contrast  05/13/2012  *RADIOLOGY REPORT*  Clinical Data: History of Crohn disease.  Recent study shows a pneumoperitoneum possibly from perforation.  Abdominal pain.  CT ABDOMEN AND PELVIS WITH CONTRAST  Technique:  Multidetector CT imaging of the abdomen and pelvis was performed following the standard protocol during bolus administration of intravenous contrast.  Contrast: OMNIPAQUE IOHEXOL 300 MG/ML  SOLN  Comparison: CT of abdomen and pelvis 05/08/2012.  Findings:  Lung Bases: Small bilateral pleural effusions.  Bibasilar areas of passive atelectasis.  Calcified granuloma in the left lower lobe again noted.  Abdomen/Pelvis:  There are extensive multifocal areas of patchy small bowel wall thickening, most pronounced in the region of the terminal ileum.  Overall, this is similar to the prior examination. Image 56 of series 2  demonstrates some extravasation of oral contrast material from the distal ileum into a focal collection that appears encapsulated within the small bowel mesentery.  This collection has oral contrast material, low attenuation fluid, and a small locule of gas, and appears to communicate with a larger collection (image 50 of series 2) within the small bowel mesentery that is compatible with an abscess.  Several other gas and fluid collections are seen throughout the abdomen and pelvis compatible with additional abscesses, and several of these likely intercommunicate (for example, the large complex collection in the anterior right abdomen on image 52 of series 2 appears to potentially intercommunicate through a series of fistulous tracts all the way back to the original site of perforation). There is also some ascites scattered throughout the abdomen and pelvis, with the largest collection in the right infrahepatic region. However, there are other separate collections which do not appear to communicate with the original site of perforation, including a large collection in the pelvis which tracks up along the pelvic side wall bilaterally (right greater than left).  High attenuation material is layering dependently within the gallbladder lumen, which may represent vicarious excretion of contrast material from the prior CT scan or biliary sludge.  The enhanced appearance of the pancreas, spleen, bilateral adrenal glands and bilateral kidneys is unremarkable.  Prostate and urinary bladder are unremarkable in appearance. Extensive colonic diverticulosis.  Musculoskeletal: There are no aggressive appearing lytic or blastic lesions noted in the visualized portions of the skeleton.  IMPRESSION:  1.  Perforation of the distal ileum with multifocal intra abdominal and pelvic abscesses, several which appear to intercommunicate, as detailed above. 2.  Moderate volume of ascites. 3.  Extensive inflammatory changes throughout the  small bowel redemonstrated, compatible with underlying enteritis such as Crohn disease. 4.  Vicarious excretion of contrast material and/or biliary sludge layering dependently within the lumen of the gallbladder. 5.  Colonic diverticulosis.  These results were called by telephone on 05/13/2012  at  04:10 p.m. to  Dr. Christella Hartigan, who verbally acknowledged these results.  Original Report Authenticated By: Florencia Reasons, M.D.     ASSESSMENT / PLAN:  1. Crohn's ileitis (recently diagnosed) with perforation / abscesses. Patient is s/p exploratory lap / ileocecectomy with primary anastomosis and drainage of abscesses by Dr. Gerrit Friends on 05/13/12. Patient afebrile, WBC 26.7. On IV Flagyl. Management per surgery.    2.  Cirrhosis by CTscan. He does drink ETOH. Further workup as outpatient.     LOS: 7 days   Willette Cluster  05/15/2012, 8:16 AM

## 2012-05-15 NOTE — Progress Notes (Signed)
1 Day Post-Op  Subjective: No complaints  Objective: Vital signs in last 24 hours: Temp:  [97.3 F (36.3 C)-98 F (36.7 C)] 97.7 F (36.5 C) (06/17 1047) Pulse Rate:  [66-81] 66  (06/17 1047) Resp:  [12-24] 20  (06/17 1047) BP: (95-115)/(55-71) 106/69 mmHg (06/17 1047) SpO2:  [93 %-98 %] 95 % (06/17 1047) Last BM Date: 05/13/12  Intake/Output from previous day: 06/16 0701 - 06/17 0700 In: 3400 [I.V.:3400] Out: 2750 [Urine:1200; Blood:50] Intake/Output this shift:    General appearance: alert, cooperative and no distress Resp: nonlabored Cardio: normal rate GI: soft, appropriate tenderness, midline wound clean but packed, moderated distension, no peritoneal signs  Lab Results:   Basename 05/15/12 0345 05/14/12 0404  WBC 26.7* 27.2*  HGB 12.9* 13.0  HCT 38.2* 38.1*  PLT 543* 511*   BMET  Basename 05/15/12 0345 05/13/12 0402  NA 136 135  K 4.3 3.5  CL 106 105  CO2 24 23  GLUCOSE 155* 109*  BUN 5* 7  CREATININE 0.83 0.88  CALCIUM 7.3* 7.6*   PT/INR No results found for this basename: LABPROT:2,INR:2 in the last 72 hours ABG No results found for this basename: PHART:2,PCO2:2,PO2:2,HCO3:2 in the last 72 hours  Studies/Results: Ct Abdomen Pelvis W Contrast  05/13/2012  *RADIOLOGY REPORT*  Clinical Data: History of Crohn disease.  Recent study shows a pneumoperitoneum possibly from perforation.  Abdominal pain.  CT ABDOMEN AND PELVIS WITH CONTRAST  Technique:  Multidetector CT imaging of the abdomen and pelvis was performed following the standard protocol during bolus administration of intravenous contrast.  Contrast: OMNIPAQUE IOHEXOL 300 MG/ML  SOLN  Comparison: CT of abdomen and pelvis 05/08/2012.  Findings:  Lung Bases: Small bilateral pleural effusions.  Bibasilar areas of passive atelectasis.  Calcified granuloma in the left lower lobe again noted.  Abdomen/Pelvis:  There are extensive multifocal areas of patchy small bowel wall thickening, most pronounced  in the region of the terminal ileum.  Overall, this is similar to the prior examination. Image 56 of series 2 demonstrates some extravasation of oral contrast material from the distal ileum into a focal collection that appears encapsulated within the small bowel mesentery.  This collection has oral contrast material, low attenuation fluid, and a small locule of gas, and appears to communicate with a larger collection (image 50 of series 2) within the small bowel mesentery that is compatible with an abscess.  Several other gas and fluid collections are seen throughout the abdomen and pelvis compatible with additional abscesses, and several of these likely intercommunicate (for example, the large complex collection in the anterior right abdomen on image 52 of series 2 appears to potentially intercommunicate through a series of fistulous tracts all the way back to the original site of perforation). There is also some ascites scattered throughout the abdomen and pelvis, with the largest collection in the right infrahepatic region. However, there are other separate collections which do not appear to communicate with the original site of perforation, including a large collection in the pelvis which tracks up along the pelvic side wall bilaterally (right greater than left).  High attenuation material is layering dependently within the gallbladder lumen, which may represent vicarious excretion of contrast material from the prior CT scan or biliary sludge.  The enhanced appearance of the pancreas, spleen, bilateral adrenal glands and bilateral kidneys is unremarkable.  Prostate and urinary bladder are unremarkable in appearance. Extensive colonic diverticulosis.  Musculoskeletal: There are no aggressive appearing lytic or blastic lesions noted in the  visualized portions of the skeleton.  IMPRESSION:  1.  Perforation of the distal ileum with multifocal intra abdominal and pelvic abscesses, several which appear to  intercommunicate, as detailed above. 2.  Moderate volume of ascites. 3.  Extensive inflammatory changes throughout the small bowel redemonstrated, compatible with underlying enteritis such as Crohn disease. 4.  Vicarious excretion of contrast material and/or biliary sludge layering dependently within the lumen of the gallbladder. 5.  Colonic diverticulosis.  These results were called by telephone on 05/13/2012  at  04:10 p.m. to  Dr. Christella Hartigan, who verbally acknowledged these results.  Original Report Authenticated By: Florencia Reasons, M.D.    Anti-infectives: Anti-infectives     Start     Dose/Rate Route Frequency Ordered Stop   05/14/12 1800   metroNIDAZOLE (FLAGYL) IVPB 500 mg        500 mg 100 mL/hr over 60 Minutes Intravenous Every 8 hours 05/14/12 1206     05/14/12 1215   ciprofloxacin (CIPRO) IVPB 400 mg  Status:  Discontinued        400 mg 200 mL/hr over 60 Minutes Intravenous Every 12 hours 05/14/12 1206 05/14/12 1215   05/08/12 1700   ciprofloxacin (CIPRO) IVPB 400 mg  Status:  Discontinued        400 mg 200 mL/hr over 60 Minutes Intravenous Every 12 hours 05/08/12 1538 05/14/12 1300   05/08/12 1600   metroNIDAZOLE (FLAGYL) IVPB 500 mg  Status:  Discontinued        500 mg 100 mL/hr over 60 Minutes Intravenous Every 6 hours 05/08/12 1538 05/14/12 1216          Assessment/Plan: s/p Procedure(s) (LRB): EXPLORATORY LAPAROTOMY (N/A) SMALL BOWEL RESECTION (N/A) He is moderately distended and I expect he will have a postop ileus.  Will go slow with diet.  awainting return of bowel function  LOS: 7 days    Lodema Pilot Aqil 05/15/2012

## 2012-05-15 NOTE — Progress Notes (Signed)
Agree with Ms. Guenther's assessment and plan. Iva Boop, MD, [facility]  We will follow loosely - care per surgery at this point with outpatient GI follow-up in July or August as long as everything going well. Ask that he be changed to surgery service as attending.

## 2012-05-16 MED ORDER — KETOROLAC TROMETHAMINE 30 MG/ML IJ SOLN
30.0000 mg | Freq: Once | INTRAMUSCULAR | Status: AC
  Administered 2012-05-16: 30 mg via INTRAVENOUS
  Filled 2012-05-16: qty 1

## 2012-05-16 MED ORDER — ACETAMINOPHEN 10 MG/ML IV SOLN
1000.0000 mg | Freq: Four times a day (QID) | INTRAVENOUS | Status: AC
  Administered 2012-05-16 – 2012-05-17 (×3): 1000 mg via INTRAVENOUS
  Filled 2012-05-16 (×3): qty 100

## 2012-05-16 MED ORDER — ACETAMINOPHEN 10 MG/ML IV SOLN
1000.0000 mg | Freq: Four times a day (QID) | INTRAVENOUS | Status: DC
  Administered 2012-05-16: 1000 mg via INTRAVENOUS
  Filled 2012-05-16 (×3): qty 100

## 2012-05-16 MED ORDER — HYDROMORPHONE HCL PF 1 MG/ML IJ SOLN: 0.5000 mg | Freq: Two times a day (BID) | INTRAMUSCULAR | Status: DC | PRN

## 2012-05-16 MED ORDER — HYDROMORPHONE HCL PF 1 MG/ML IJ SOLN
0.5000 mg | INTRAMUSCULAR | Status: DC | PRN
  Administered 2012-05-16: 1 mg via INTRAVENOUS
  Filled 2012-05-16: qty 1

## 2012-05-16 NOTE — Progress Notes (Signed)
2 Days Post-Op  Subjective: Still distended had a terrible day yesterday.  Feel swollen up also.  Objective: Vital signs in last 24 hours: Temp:  [97.5 F (36.4 C)-98.4 F (36.9 C)] 97.5 F (36.4 C) (06/18 1023) Pulse Rate:  [61-80] 61  (06/18 1023) Resp:  [16-28] 18  (06/18 1023) BP: (105-121)/(62-70) 105/63 mmHg (06/18 1023) SpO2:  [93 %-98 %] 95 % (06/18 1023) Last BM Date: 05/13/12  Diet:  NPO, afebrile, VSS, labs shows WBC is still very high  Intake/Output from previous day: 06/17 0701 - 06/18 0700 In: 3972.9 [I.V.:3572.9; IV Piggyback:400] Out: 900 [Urine:900] Intake/Output this shift:    General appearance: alert, cooperative and no distress Resp: clear to auscultation bilaterally GI: tender, distended, BS hypoactive, wound is clean and looks goo.  Lab Results:   Basename 05/15/12 0345 05/14/12 0404  WBC 26.7* 27.2*  HGB 12.9* 13.0  HCT 38.2* 38.1*  PLT 543* 511*    BMET  Basename 05/15/12 0345  NA 136  K 4.3  CL 106  CO2 24  GLUCOSE 155*  BUN 5*  CREATININE 0.83  CALCIUM 7.3*   PT/INR No results found for this basename: LABPROT:2,INR:2 in the last 72 hours   Lab 05/10/12 0333  AST 12  ALT 16  ALKPHOS 64  BILITOT 0.4  PROT 5.3*  ALBUMIN 1.9*     Lipase  No results found for this basename: lipase     Studies/Results: No results found.  Medications:    . alvimopan  12 mg Oral BID  . ketorolac  30 mg Intravenous Once  . metronidazole  500 mg Intravenous Q8H  . pantoprazole (PROTONIX) IV  40 mg Intravenous BID  . DISCONTD: HYDROmorphone PCA 0.3 mg/mL   Intravenous Q4H    Assessment/Plan Crohn's disease with perforation of terminal ileum, intraabdominal abscesses. S/P Exploratory laparotomy.  2. Ileocecectomy.  3. Drainage of intraabdominal abscess 05/14/12.  Dr. Gerrit Friends Cirrhosis by CT/hx of ETOH use 1/2 gal bourbon/wk.  Tobacco use, 1.5 PPD since teen years/COPD  MJ use  Bilateral inguinal hernias  Hx gerd  hX OF  psoriasis  Plan:  PCA has been stopped because it was driving him crazy.  Will decrease his IV rate, change pain med to IV q1h, and add tylenol IV.  Foley is out, increase activity. Recheck labs in AM.      LOS: 8 days    Montgomery,Samuel 05/16/2012  He looks appropriate, wound clean and dressing changed.  Awaiting return of bowel function

## 2012-05-16 NOTE — Progress Notes (Signed)
INITIAL ADULT NUTRITION ASSESSMENT Date: 05/16/2012   Time: 4:45 PM Reason for Assessment: NPO x 7 days  ASSESSMENT: Male 56 y.o.  Dx: Crohn's ileitis with perforation and abscess formation   Hx:  Past Medical History  Diagnosis Date  . GERD (gastroesophageal reflux disease)   . Psoriasis   . Benign neoplasm of colon 05/20/2005    adenomatous polyp  . Diverticulosis of colon (without mention of hemorrhage)   . COPD (chronic obstructive pulmonary disease)   . Inguinal hernia     bi-lateral  . 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 ? Perianal fistula vs. Abscess      Related Meds:  Scheduled Meds:   . acetaminophen  1,000 mg Intravenous Q6H  . alvimopan  12 mg Oral BID  . ketorolac  30 mg Intravenous Once  . metronidazole  500 mg Intravenous Q8H  . pantoprazole (PROTONIX) IV  40 mg Intravenous BID  . DISCONTD: HYDROmorphone PCA 0.3 mg/mL   Intravenous Q4H   Continuous Infusions:   . dextrose 5 % and 0.45 % NaCl with KCl 20 mEq/L 125 mL/hr at 05/16/12 1032   PRN Meds:.diphenhydrAMINE, diphenhydrAMINE, HYDROmorphone (DILAUDID) injection, ondansetron (ZOFRAN) IV, ondansetron (ZOFRAN) IV, ondansetron, zolpidem, DISCONTD:  HYDROmorphone (DILAUDID) injection, DISCONTD: naloxone, DISCONTD: sodium chloride   Ht: 5\' 4"  (162.6 cm)  Wt: 157 lb (71.215 kg)  Ideal Wt: 59 kg % Ideal Wt: 120.7% Wt Readings from Last 10 Encounters:  05/08/12 157 lb (71.215 kg)  05/08/12 157 lb (71.215 kg)  05/08/12 154 lb 12.8 oz (70.217 kg)  05/02/12 159 lb (72.122 kg)  04/07/12 159 lb 9.6 oz (72.394 kg)  03/30/12 159 lb (72.122 kg)  03/14/12 159 lb 6.4 oz (72.303 kg)  01/04/12 157 lb 12.8 oz (71.578 kg)  02/26/09 157 lb (71.215 kg)    Usual Wt: 157 lb per patient's wife % Usual Wt: 100%  Body mass index is 26.95 kg/(m^2). (Overweight)  Food/Nutrition Related Hx: Per patient's wife, patient with good appetite and intake PTA. Per discussion with wife, MD  reported likely to advance diet once patient voids.   Labs:  CMP     Component Value Date/Time   NA 136 05/15/2012 0345   K 4.3 05/15/2012 0345   CL 106 05/15/2012 0345   CO2 24 05/15/2012 0345   GLUCOSE 155* 05/15/2012 0345   BUN 5* 05/15/2012 0345   CREATININE 0.83 05/15/2012 0345   CREATININE 0.91 01/04/2012 1230   CALCIUM 7.3* 05/15/2012 0345   PROT 5.3* 05/10/2012 0333   ALBUMIN 1.9* 05/10/2012 0333   AST 12 05/10/2012 0333   ALT 16 05/10/2012 0333   ALKPHOS 64 05/10/2012 0333   BILITOT 0.4 05/10/2012 0333   GFRNONAA >90 05/15/2012 0345   GFRAA >90 05/15/2012 0345    Intake/Output Summary (Last 24 hours) at 05/16/12 1647 Last data filed at 05/16/12 1449  Gross per 24 hour  Intake 3672.92 ml  Output    901 ml  Net 2771.92 ml     Diet Order: NPO  Supplements/Tube Feeding: none at this time  IVF:    dextrose 5 % and 0.45 % NaCl with KCl 20 mEq/L Last Rate: 125 mL/hr at 05/16/12 1032    Estimated Nutritional Needs:   Kcal: 1610-9604 Protein: 86-107 grams Fluid: 1 ml per kcal intake  NUTRITION DIAGNOSIS: -Inadequate oral intake (NI-2.1).  Status: Ongoing  RELATED TO: inability to eat  AS EVIDENCE BY: NPO status  MONITORING/EVALUATION(Goals): Diet advancement/ tolerance, labs, weights 1. Diet  advancement as medically able to meet > 90% of estimated energy needs.   EDUCATION NEEDS: -No education needs identified at this time  INTERVENTION: 1. Recommend diet advancement as medically able to meet > 90% of estimated energy needs. Once diet advanced, recommend order patient resource breeze nutrition supplement BID. Provides 500 kcal and 18 grams of protein daily.  2. RD to follow for nutrition plan of care.   Dietitian [phone]  DOCUMENTATION CODES Per approved criteria  -Not Applicable    Iven Finn [facility] 05/16/2012, 4:45 PM

## 2012-05-17 LAB — MAGNESIUM: Magnesium: 2.2 mg/dL (ref 1.5–2.5)

## 2012-05-17 LAB — CBC
HCT: 31.1 % — ABNORMAL LOW (ref 39.0–52.0)
Hemoglobin: 10.3 g/dL — ABNORMAL LOW (ref 13.0–17.0)
RDW: 15 % (ref 11.5–15.5)
WBC: 13.3 10*3/uL — ABNORMAL HIGH (ref 4.0–10.5)

## 2012-05-17 LAB — COMPREHENSIVE METABOLIC PANEL
ALT: 9 U/L (ref 0–53)
Albumin: 1.3 g/dL — ABNORMAL LOW (ref 3.5–5.2)
Alkaline Phosphatase: 128 U/L — ABNORMAL HIGH (ref 39–117)
BUN: 9 mg/dL (ref 6–23)
Chloride: 106 mEq/L (ref 96–112)
Potassium: 3.9 mEq/L (ref 3.5–5.1)
Sodium: 135 mEq/L (ref 135–145)
Total Bilirubin: 0.4 mg/dL (ref 0.3–1.2)

## 2012-05-17 MED ORDER — ACETAMINOPHEN 10 MG/ML IV SOLN
1000.0000 mg | Freq: Four times a day (QID) | INTRAVENOUS | Status: DC
  Administered 2012-05-17: 1000 mg via INTRAVENOUS
  Filled 2012-05-17 (×3): qty 100

## 2012-05-17 MED ORDER — ENOXAPARIN SODIUM 40 MG/0.4ML ~~LOC~~ SOLN
40.0000 mg | SUBCUTANEOUS | Status: DC
  Administered 2012-05-17 – 2012-05-20 (×4): 40 mg via SUBCUTANEOUS
  Filled 2012-05-17 (×5): qty 0.4

## 2012-05-17 MED ORDER — FENTANYL CITRATE 0.05 MG/ML IJ SOLN
25.0000 ug | INTRAMUSCULAR | Status: DC | PRN
  Administered 2012-05-17 – 2012-05-19 (×9): 50 ug via INTRAVENOUS
  Administered 2012-05-19: 25 ug via INTRAVENOUS
  Filled 2012-05-17 (×11): qty 2

## 2012-05-17 MED ORDER — ACETAMINOPHEN 10 MG/ML IV SOLN: 1000.0000 mg | Freq: Four times a day (QID) | INTRAVENOUS | Status: DC

## 2012-05-17 NOTE — Progress Notes (Signed)
Patient ambulated x1with walker in the hallway.

## 2012-05-17 NOTE — Progress Notes (Signed)
Pt says dilaudid makes him crazy.  Renew IV tylenol, try fentanyl IV if tylenol is in effective.  Mom says morphine makes whole family crazy.

## 2012-05-17 NOTE — Progress Notes (Signed)
Patient ID: STALIN GRUENBERG, male   DOB: [birth year], 56 y.o.   MRN: [id number] 3 Days Post-Op  Subjective: Still distended, but feels better since PCA d/c'd. Still feels swollen up,no reported NV,Flatus,BM. Wants to shower.  Objective: Vital signs in last 24 hours: Temp:  [97.7 F (36.5 C)-98.9 F (37.2 C)] 98.5 F (36.9 C) (06/19 0647) Pulse Rate:  [60-72] 72  (06/19 0647) Resp:  [18-20] 18  (06/19 0647) BP: (117-133)/(64-74) 123/68 mmHg (06/19 0647) SpO2:  [96 %-99 %] 99 % (06/19 0647) Last BM Date: 05/13/12  Diet:  NPO, afebrile, VSS, labs shows WBC remains slightly elevated but is much better 13.3 vs 26.7. Alk phos remains elevated at 128, TBili 0.4. HGB down most likely due to hydration. Intake/Output from previous day: 06/18 0701 - 06/19 0700 In: 100 [IV Piggyback:100] Out: 401 [Urine:401] Intake/Output this shift: Total I/O In: 0  Out: 450 [Urine:450]  General appearance: alert, cooperative and no distress  Resp: clear to auscultation bilaterally  GI: tender, distended, BS hypoactive, wound is clean and looks good. Remains afebrile.   Lab Results:   Basename 05/17/12 0327 05/15/12 0345  WBC 13.3* 26.7*  HGB 10.3* 12.9*  HCT 31.1* 38.2*  PLT 563* 543*    BMET  Basename 05/17/12 0327 05/15/12 0345  NA 135 136  K 3.9 4.3  CL 106 106  CO2 22 24  GLUCOSE 87 155*  BUN 9 5*  CREATININE 0.83 0.83  CALCIUM 7.4* 7.3*   PT/INR No results found for this basename: LABPROT:2,INR:2 in the last 72 hours   Lab 05/17/12 0327  AST 13  ALT 9  ALKPHOS 128*  BILITOT 0.4  PROT 4.5*  ALBUMIN 1.3*     Lipase  No results found for this basename: lipase     Studies/Results: No results found.  Medications:    . acetaminophen  1,000 mg Intravenous Q6H  . alvimopan  12 mg Oral BID  . metronidazole  500 mg Intravenous Q8H  . pantoprazole (PROTONIX) IV  40 mg Intravenous BID  . DISCONTD: acetaminophen  1,000 mg Intravenous Q6H    Assessment/Plan Crohn's  disease with perforation of terminal ileum, intraabdominal abscesses. S/P Exploratory laparotomy.  2. Ileocecectomy.  3. Drainage of intraabdominal abscess 05/14/12.  Dr. Gerrit Friends Cirrhosis by CT/hx of ETOH use 1/2 gal bourbon/wk.  Tobacco use, 1.5 PPD since teen years/COPD  MJ use  Bilateral inguinal hernias  Hx gerd  hX OF psoriasis  Plan:     1.Continue with tylenol IV.  2. Increase activity.  3. Keep on sips with meds and ice chips only for now.   LOS: 9 days    DUANNE, JEREMY 05/17/2012   Seems to be doing okay. Awaiting bowel function but if pain controlled and no nausea or distension, I think that it would be okay to try advancing diet tomorrow.

## 2012-05-18 MED ORDER — BOOST / RESOURCE BREEZE PO LIQD
1.0000 | Freq: Two times a day (BID) | ORAL | Status: DC
  Administered 2012-05-19 – 2012-05-21 (×5): 1 via ORAL

## 2012-05-18 MED ORDER — BIOTENE DRY MOUTH MT LIQD
15.0000 mL | Freq: Two times a day (BID) | OROMUCOSAL | Status: DC
  Administered 2012-05-18 – 2012-05-20 (×5): 15 mL via OROMUCOSAL

## 2012-05-18 MED ORDER — CHLORHEXIDINE GLUCONATE 0.12 % MT SOLN
15.0000 mL | Freq: Two times a day (BID) | OROMUCOSAL | Status: DC
Start: 2012-05-18 — End: 2012-05-21
  Administered 2012-05-18 – 2012-05-21 (×5): 15 mL via OROMUCOSAL
  Filled 2012-05-18 (×10): qty 15

## 2012-05-18 MED ORDER — ACETAMINOPHEN 10 MG/ML IV SOLN
1000.0000 mg | Freq: Four times a day (QID) | INTRAVENOUS | Status: AC
  Administered 2012-05-18 (×2): 1000 mg via INTRAVENOUS
  Filled 2012-05-18 (×2): qty 100

## 2012-05-18 NOTE — Progress Notes (Signed)
UR done. 

## 2012-05-18 NOTE — Progress Notes (Signed)
4 Days Post-Op  Subjective: He feels better, having a BM, less bloated, denies nausea  Objective: Vital signs in last 24 hours: Temp:  [98 F (36.7 C)-98.9 F (37.2 C)] 98.9 F (37.2 C) (06/20 0539) Pulse Rate:  [60-67] 62  (06/20 0539) Resp:  [18] 18  (06/20 0539) BP: (121-130)/(69-75) 130/75 mmHg (06/20 0539) SpO2:  [95 %-97 %] 95 % (06/20 0539) Last BM Date: 05/13/12  Intake/Output from previous day: 06/19 0701 - 06/20 0700 In: 545 [I.V.:345; IV Piggyback:200] Out: 2375 [Urine:2375] Intake/Output this shift:    Comfortable Abdomen full, minimally tender Wound clean  Lab Results:   [facility] 05/17/12 0327  WBC 13.3*  HGB 10.3*  HCT 31.1*  PLT 563*   BMET  Basename 05/17/12 0327  NA 135  K 3.9  CL 106  CO2 22  GLUCOSE 87  BUN 9  CREATININE 0.83  CALCIUM 7.4*   PT/INR No results found for this basename: LABPROT:2,INR:2 in the last 72 hours ABG No results found for this basename: PHART:2,PCO2:2,PO2:2,HCO3:2 in the last 72 hours  Studies/Results: No results found.  Anti-infectives: Anti-infectives     Start     Dose/Rate Route Frequency Ordered Stop   05/14/12 1800   metroNIDAZOLE (FLAGYL) IVPB 500 mg        500 mg 100 mL/hr over 60 Minutes Intravenous Every 8 hours 05/14/12 1206     05/14/12 1215   ciprofloxacin (CIPRO) IVPB 400 mg  Status:  Discontinued        400 mg 200 mL/hr over 60 Minutes Intravenous Every 12 hours 05/14/12 1206 05/14/12 1215   05/08/12 1700   ciprofloxacin (CIPRO) IVPB 400 mg  Status:  Discontinued        400 mg 200 mL/hr over 60 Minutes Intravenous Every 12 hours 05/08/12 1538 05/14/12 1300   05/08/12 1600   metroNIDAZOLE (FLAGYL) IVPB 500 mg  Status:  Discontinued        500 mg 100 mL/hr over 60 Minutes Intravenous Every 6 hours 05/08/12 1538 05/14/12 1216          Assessment/Plan: s/p Procedure(s) (LRB): EXPLORATORY LAPAROTOMY (N/A) SMALL BOWEL RESECTION (N/A)   Ileus resolving Continue current care Advance  po slowly  LOS: 10 days    Quanita Barona A 05/18/2012

## 2012-05-18 NOTE — Progress Notes (Signed)
Pharmacy Brief Note - Alvimopan (Entereg)  The standing order set for alvimopan (Entereg) now includes an automatic order to discontinue the drug after the patient has had a bowel movement.  The change was approved by the Pharmacy & Therapeutics Committee and the Medical Executive Committee.    This patient has had a bowel movement documented by nursing.  Therefore, alvimopan has been discontinued.  If there are questions, please contact the pharmacy at [phone].  Thank you Gwen Her PharmD  [phone] 05/18/2012 12:18 PM

## 2012-05-19 MED ORDER — FLORA-Q PO CAPS
1.0000 | ORAL_CAPSULE | Freq: Every day | ORAL | Status: DC
  Administered 2012-05-19 – 2012-05-21 (×3): 1 via ORAL
  Filled 2012-05-19 (×3): qty 1

## 2012-05-19 NOTE — Progress Notes (Signed)
Brief Nutrition Note  Reason: Patient diet has been advanced from clear to full liquid.   Patient reported his appetite and intake are well. He denies any problems with nausea or vomiting. He reported he likes [employer], advertising. Patient requested diet education for chron's disease and low fiber diet. Discussed and provided handouts obtained from ADA nutrition care manual. Patient asked good questions and is without any further nutrition related questions.   Will continue to provide Resource Breeze BID and monitor PO intake.   RD available for nutrition needs.   Samuel Montgomery [facility] [phone]

## 2012-05-19 NOTE — Progress Notes (Signed)
Patient states he had two small stools each time pretty quickly after eating. Will continue to monitor. Stools were not visualized

## 2012-05-19 NOTE — Progress Notes (Signed)
5 Days Post-Op  Subjective: Doing well, stools loose lots of gas, wants to go up on diet.  Objective: Vital signs in last 24 hours: Temp:  [98.6 F (37 C)-99.2 F (37.3 C)] 98.8 F (37.1 C) (06/21 0440) Pulse Rate:  [62-63] 63  (06/21 0440) Resp:  [14-20] 14  (06/21 0440) BP: (116-143)/(59-77) 121/59 mmHg (06/21 0440) SpO2:  [96 %-98 %] 96 % (06/21 0440) Last BM Date: 05/19/12  1320 PO, 3 stools recorded, diet clears, afebrile, VSS, no labs since 6/19  Intake/Output from previous day: 06/20 0701 - 06/21 0700 In: 2196.3 [P.O.:1320; I.V.:776.3; IV Piggyback:100] Out: 1500 [Urine:1500] Intake/Output this shift: Total I/O In: -  Out: 200 [Urine:200]  General appearance: alert, cooperative and mild distress Resp: clear to auscultation bilaterally GI: soft, still a little distended, open wound OK, +BS, +BM.  Lab Results:   [facility] 05/17/12 0327  WBC 13.3*  HGB 10.3*  HCT 31.1*  PLT 563*    BMET  Basename 05/17/12 0327  NA 135  K 3.9  CL 106  CO2 22  GLUCOSE 87  BUN 9  CREATININE 0.83  CALCIUM 7.4*   PT/INR No results found for this basename: LABPROT:2,INR:2 in the last 72 hours   Lab 05/17/12 0327  AST 13  ALT 9  ALKPHOS 128*  BILITOT 0.4  PROT 4.5*  ALBUMIN 1.3*     Lipase  No results found for this basename: lipase     Studies/Results: No results found.  Medications:    . antiseptic oral rinse  15 mL Mouth Rinse q12n4p  . chlorhexidine  15 mL Mouth Rinse BID  . enoxaparin (LOVENOX) injection  40 mg Subcutaneous Q24H  . feeding supplement  1 Container Oral BID BM  . metronidazole  500 mg Intravenous Q8H  . pantoprazole (PROTONIX) IV  40 mg Intravenous BID    Assessment/Plan Crohn's disease with perforation of terminal ileum, intraabdominal abscesses. S/P Exploratory laparotomy.  2. Ileocecectomy.  3. Drainage of intraabdominal abscess 05/14/12. Dr. Gerrit Friends  Cirrhosis by CT/hx of ETOH use 1/2 gal bourbon/wk.  Tobacco use, 1.5 PPD since  teen years/COPD  MJ use  Bilateral inguinal hernias  Hx gerd  hX OF psoriasis   Plan:  Advance diet, case manager to help he will need wet to dry dressing, at home.  Defer to GI any meds for Crohn's. Labs in AM. He may need a couple more staples out before d/c.    LOS: 11 days    JENNINGS,WILLARD 05/19/2012  Doing fine.  Tolerating diet and moving bowels.  Advance diet as tolerated

## 2012-05-19 NOTE — Progress Notes (Signed)
CARE MANAGEMENT NOTE 05/19/2012  Patient:  Samuel, Montgomery   Account Number:  [id number]  Date Initiated:  05/18/2012  Documentation initiated by:  Calais Svehla  Subjective/Objective Assessment:   56 yo male admitted 05/08/12 with severe abdominal pain, Crohns     Action/Plan:   D/C when medically stable   Anticipated DC Date:  05/22/2012   Anticipated DC Plan:  HOME W HOME HEALTH SERVICES      DC Planning Services  CM consult      [facility] Department Choice  HOME HEALTH   Choice offered to / List presented to:  C-1 Patient        HH arranged  HH-1 RN      [facility] agency  [facility]   Status of service:  In process, will continue to follow  Discharge Disposition:  HOME W HOME HEALTH SERVICES  Per UR Regulation:  Reviewed for med. necessity/level of care/duration of stay  If discussed at Long Length of Stay Meetings, dates discussed:   05/17/2012    Comments:  6/221/13,Ciara Kagan RNC-MNN, BSN, [phone],  CM received referral.  CM met with pt to offer choice for [facility] services. Pt states [facility] is his first choice for [facility] services.  Darl Pikes at [facility] contacted with orders and confirmation of services received.  Pt states he lives alone but daughter and mother can assist in care and be teachable caregivers. 05/18/12, Kathi Der RNC-MNN, BSN, [phone], Pt S/P Exp. Lap with post op ileus   HOME HEALTH AGENCIES SERVING [place]   Agencies that are Medicare-Certified and are affiliated with [facility]  Telephone Number Address  [facility]   The [facility] has ownership interest in this company; however, you are under no obligation to use this agency. [phone] or  [phone] [address] [place], [place] [address]   Agencies that are Medicare-Certified and are not affiliated with [facility]  Telephone Number Address  [facility] [phone] Fax [phone] [address], [address] [place], [place]  [address]  [facility] [phone] or [phone] Fax [phone] [address] [place], [place] [address]  [facility] [phone] Fax [phone] [address] [place], [place] [address]  [facility] [phone] Fax [phone] [address] [place], [place]  [address]  [facility] [phone] Fax [phone] [address] [place], [place] [address]  [facility] [phone] [address] [place], [place] [address]  [facility] [phone]  [address] [place], [place] [address]  [facility] [phone] or [phone] Fax [phone] [address] [place], [place]  [address]  [facility] [phone] Fax [phone] [address] [place], [place]  [address]  [facility]  [phone] Fax [phone] [address] [place], [place] [address]  Agencies that are not Medicare-Certified and are not affiliated with [facility] Telephone Number Address  [facility] [place] [phone] or [phone] Fax [phone] [address] [place]  [address]  [facility] [phone] Fax [phone] [address] [place], [place]  [address]  [facility]  [phone] Fax [phone] [address] [place], [place] [address]  [facility] [place] [facility] [phone] Fax [phone] [address] [place], [place] [address]  [facility] [phone] or [phone] Fax [phone] [address], [address] [place], [place]  [address]  [facility] [phone] or [phone] Fax  [phone] [address] [place], [place]  [address]  [facility] [phone] Fax [phone] [address] [place], [place]  [address]  [facility] [phone] [address] [place], [place]  [address]  [facility] [phone] Fax [phone] [address] [place], [place]  [address]  [facility] [phone] Fax [phone] [address] [place], [place]  [address]  [facility] [phone] Fax [phone] [address] [place], [place] [address]  [facility] [phone] Fax [phone] [address] [place], [place]  [address]

## 2012-05-20 LAB — COMPREHENSIVE METABOLIC PANEL
ALT: 11 U/L (ref 0–53)
AST: 19 U/L (ref 0–37)
Albumin: 1.6 g/dL — ABNORMAL LOW (ref 3.5–5.2)
Alkaline Phosphatase: 143 U/L — ABNORMAL HIGH (ref 39–117)
CO2: 25 mEq/L (ref 19–32)
Chloride: 104 mEq/L (ref 96–112)
Creatinine, Ser: 0.81 mg/dL (ref 0.50–1.35)
GFR calc non Af Amer: >90 mL/min (ref >90)
Potassium: 3.8 mEq/L (ref 3.5–5.1)
Sodium: 135 mEq/L (ref 135–145)
Total Bilirubin: 0.2 mg/dL — ABNORMAL LOW (ref 0.3–1.2)

## 2012-05-20 LAB — CBC
MCV: 88.5 fL (ref 78.0–100.0)
Platelets: 667 10*3/uL — ABNORMAL HIGH (ref 150–400)
RBC: 3.58 MIL/uL — ABNORMAL LOW (ref 4.22–5.81)
WBC: 9.6 10*3/uL (ref 4.0–10.5)

## 2012-05-20 MED ORDER — HYDROCODONE-ACETAMINOPHEN 5-325 MG PO TABS
1.0000 | ORAL_TABLET | ORAL | Status: DC | PRN
  Administered 2012-05-20 – 2012-05-21 (×2): 2 via ORAL
  Filled 2012-05-20 (×2): qty 2

## 2012-05-20 MED ORDER — PANTOPRAZOLE SODIUM 40 MG PO TBEC
40.0000 mg | DELAYED_RELEASE_TABLET | Freq: Two times a day (BID) | ORAL | Status: DC
  Administered 2012-05-20 – 2012-05-21 (×2): 40 mg via ORAL
  Filled 2012-05-20 (×5): qty 1

## 2012-05-20 NOTE — Progress Notes (Signed)
MD, Patient would like an order to 1. Take a Shower (wasn't sure w/ his abdominal dressing whether this was ok) and 2. An order to go outside.  Thanks, .Kenton Kingfisher [place]

## 2012-05-20 NOTE — Progress Notes (Signed)
General Surgery Note  LOS: 12 days  POD# 6 Room - 1303 GI doctor - Gessner  Assessment/Plan: 1. EXPLORATORY LAPAROTOMY, ileocectomy, drainage of abscess - T. Gerkin - 05/14/2012  On Flagyl.  Probably ought to stay on this for a total of 2 weeks.  Samuel Montgomery has a partially open wound that looks good.  Samuel Montgomery may shower.  Tolerating regular food.  From our standpoint - Samuel Montgomery can go home tomorrow.  Home health care has been contacted about assisting with his wound.  I wrote him a note to be out of work until 06/13/2012 (one month from surgery).  Samuel Montgomery has this in hand.  Samuel Montgomery should see Dr. Gerrit Friends back in one week.  We will see him again tomorrow AM.  Plan d/c tomorrow AM.  2.  Crohn's disease  Recently diagnosed. 3.  COPD 4.  GERD 5.  DVT prophylaxis - Lovenox 6.  Smokes - Samuel Montgomery knows this is bad for  Subjective:  Doing well.  I answered a lot of questions Objective:   Filed Vitals:   05/20/12 0520  BP: 116/68  Pulse: 66  Temp: 98.7 F (37.1 C)  Resp: 17     Intake/Output from previous day:  06/21 0701 - 06/22 0700 In: 3830 [P.O.:840; I.V.:2990] Out: 900 [Urine:900]  Intake/Output this shift:      Physical Exam:   General: WN M who is alert and oriented.    HEENT: Normal. Pupils equal. .   Lungs: Clear.   Abdomen: Mild distention.   Wound: Clean.  I took one staple out that had blistered.   Neurologic:  Grossly intact to motor and sensory function.   Psychiatric: Has normal mood and affect. Behavior is normal   Lab Results:    Basename 05/20/12 0354  WBC 9.6  HGB 10.5*  HCT 31.7*  PLT 667*    BMET   Basename 05/20/12 0354  NA 135  K 3.8  CL 104  CO2 25  GLUCOSE 94  BUN 5*  CREATININE 0.81  CALCIUM 7.7*    PT/INR  No results found for this basename: LABPROT:2,INR:2 in the last 72 hours  ABG  No results found for this basename: PHART:2,PCO2:2,PO2:2,HCO3:2 in the last 72 hours   Studies/Results:  No results found.   Anti-infectives:   Anti-infectives     Start      Dose/Rate Route Frequency Ordered Stop   05/14/12 1800   metroNIDAZOLE (FLAGYL) IVPB 500 mg        500 mg 100 mL/hr over 60 Minutes Intravenous Every 8 hours 05/14/12 1206     05/14/12 1215   ciprofloxacin (CIPRO) IVPB 400 mg  Status:  Discontinued        400 mg 200 mL/hr over 60 Minutes Intravenous Every 12 hours 05/14/12 1206 05/14/12 1215   05/08/12 1700   ciprofloxacin (CIPRO) IVPB 400 mg  Status:  Discontinued        400 mg 200 mL/hr over 60 Minutes Intravenous Every 12 hours 05/08/12 1538 05/14/12 1300   05/08/12 1600   metroNIDAZOLE (FLAGYL) IVPB 500 mg  Status:  Discontinued        500 mg 100 mL/hr over 60 Minutes Intravenous Every 6 hours 05/08/12 1538 05/14/12 1216          Ovidio Kin, MD, FACS Pager: [phone],   [facility] Office: [phone] 05/20/2012

## 2012-05-20 NOTE — Progress Notes (Signed)
The patient is receiving Protonix by the intravenous route. Based on criteria approved by the Pharmacy and Therapeutics Committee and the Medical Executive Committee, the medication is being converted to the equivalent oral dose form.   These criteria include:  -No Active GI bleeding  -Able to tolerate diet of full liquids (or better) or tube feeding  -Able to tolerate other medications by the oral or enteral route   If you have any questions about this conversion, please contact the Pharmacy Department (ext [phone]). Thank you.  MD: Please also consider changing Flagyl to PO.  Loralee Pacas, PharmD, BCPS 05/20/2012 11:40 AM

## 2012-05-21 MED ORDER — HYDROCODONE-ACETAMINOPHEN 5-325 MG PO TABS: 1.0000 | ORAL_TABLET | Freq: Four times a day (QID) | ORAL | Status: AC | PRN

## 2012-05-21 MED ORDER — METRONIDAZOLE 500 MG PO TABS: 500.0000 mg | ORAL_TABLET | Freq: Three times a day (TID) | ORAL | Status: AC

## 2012-05-21 NOTE — Progress Notes (Signed)
General Surgery Note  LOS: 13 days  POD# 6 Room - 1303 GI doctor - Gessner  Assessment/Plan: 1. EXPLORATORY LAPAROTOMY, ileocectomy, drainage of abscess - T. Gerkin - 05/14/2012  On Flagyl.  Continue one more week.  He has a partially open wound that looks good.  He may shower.  Tolerating regular food.  Home health care has been contacted about assisting with his wound.  I wrote him a note to be out of work until 06/13/2012 (one month from surgery).  He has this in hand.  He should see Dr. Gerrit Friends back in one week.  D/C today.  Instructions reviewed.  2.  Crohn's disease  Recently diagnosed. 3.  COPD 4.  GERD 5.  DVT prophylaxis - Lovenox 6.  Smokes - he knows this is bad for  Subjective:  Doing well.  D/C today.  Instructions reviewed. Objective:   Filed Vitals:   05/21/12 0524  BP: 128/70  Pulse: 66  Temp: 98.5 F (36.9 C)  Resp: 16     Intake/Output from previous day:  06/22 0701 - 06/23 0700 In: 1540 [P.O.:1540] Out: 1450 [Urine:1450]  Intake/Output this shift:      Physical Exam:   General: WN M who is alert and oriented.    HEENT: Normal. Pupils equal. .   Lungs: Clear.   Abdomen: Mild distention.   Wound: Clean.  Looks good.   Neurologic:  Grossly intact to motor and sensory function.   Psychiatric: Has normal mood and affect. Behavior is normal   Lab Results:     Basename 05/20/12 0354  WBC 9.6  HGB 10.5*  HCT 31.7*  PLT 667*    BMET    Basename 05/20/12 0354  NA 135  K 3.8  CL 104  CO2 25  GLUCOSE 94  BUN 5*  CREATININE 0.81  CALCIUM 7.7*    PT/INR  No results found for this basename: LABPROT:2,INR:2 in the last 72 hours  ABG  No results found for this basename: PHART:2,PCO2:2,PO2:2,HCO3:2 in the last 72 hours   Studies/Results:  No results found.   Anti-infectives:   Anti-infectives     Start     Dose/Rate Route Frequency Ordered Stop   05/14/12 1800   metroNIDAZOLE (FLAGYL) IVPB 500 mg        500 mg 100 mL/hr over 60  Minutes Intravenous Every 8 hours 05/14/12 1206     05/14/12 1215   ciprofloxacin (CIPRO) IVPB 400 mg  Status:  Discontinued        400 mg 200 mL/hr over 60 Minutes Intravenous Every 12 hours 05/14/12 1206 05/14/12 1215   05/08/12 1700   ciprofloxacin (CIPRO) IVPB 400 mg  Status:  Discontinued        400 mg 200 mL/hr over 60 Minutes Intravenous Every 12 hours 05/08/12 1538 05/14/12 1300   05/08/12 1600   metroNIDAZOLE (FLAGYL) IVPB 500 mg  Status:  Discontinued        500 mg 100 mL/hr over 60 Minutes Intravenous Every 6 hours 05/08/12 1538 05/14/12 1216          Ovidio Kin, MD, FACS Pager: [phone],   [facility] Office: [phone] 05/21/2012

## 2012-05-21 NOTE — Progress Notes (Signed)
Patient to be discharged home.  IV removed.  Reviewed discharge paperwork with patient and his mother.  Patient will be taken to car by wheelchair.  Will continue to monitor.

## 2012-05-21 NOTE — Discharge Instructions (Signed)
[facility]      [facility], [place] [phone]  OPEN ABDOMINAL SURGERY: POST OP INSTRUCTIONS  Always review your discharge instruction sheet given to you by the facility where your surgery was performed.  IF YOU HAVE DISABILITY OR FAMILY LEAVE FORMS, YOU MUST BRING THEM TO THE OFFICE FOR PROCESSING.  PLEASE DO NOT GIVE THEM TO YOUR DOCTOR.  1. A prescription for pain medication may be given to you upon discharge.  Take your pain medication as prescribed, if needed.  If narcotic pain medicine is not needed, then you may take acetaminophen (Tylenol) or ibuprofen (Advil) as needed. 2. Take your usually prescribed medications unless otherwise directed. 3. If you need a refill on your pain medication, please contact your pharmacy. They will contact our office to request authorization.  Prescriptions will not be filled after 5pm or on week-ends. 4. You should follow a light diet the first few days after arrival home, such as soup and crackers, pudding, etc.unless your doctor has advised otherwise. A high-fiber, low fat diet can be resumed as tolerated.   Be sure to include lots of fluids daily. Most patients will experience some swelling and bruising on the chest and neck area.  Ice packs will help.  Swelling and bruising can take several days to resolve 5. Most patients will experience some swelling and bruising in the area of the incision. Ice pack will help. Swelling and bruising can take several days to resolve..  6. It is common to experience some constipation if taking pain medication after surgery.  Increasing fluid intake and taking a stool softener will usually help or prevent this problem from occurring.  A mild laxative (Milk of Magnesia or Miralax) should be taken according to package directions if there are no bowel movements after 48 hours. 7. ACTIVITIES:  You may resume regular (light) daily activities beginning the next day--such as daily self-care, walking, climbing stairs--gradually  increasing activities as tolerated.  You may have sexual intercourse when it is comfortable.  Refrain from any heavy lifting or straining until approved by your doctor.  WHEN TO CALL YOUR DOCTOR: 1. Fever over 101.0 2. Inability to urinate 3. Nausea and/or vomiting 4. Extreme swelling or bruising 5. Continued bleeding from incision. 6. Increased pain, redness, or drainage from the incision. 7. Difficulty swallowing or breathing 8. Muscle cramping or spasms. 9. Numbness or tingling in hands or feet or around lips.  The clinic staff is available to answer your questions during regular business hours.  Please don't hesitate to call and ask to speak to one of the nurses if you have concerns.  For further questions, please visit [email or web address]  Low Fiber and Residue Restricted Diet A low fiber diet restricts foods that contain carbohydrates that are not digested in the small intestine. A diet containing about 10 g of fiber is considered low fiber. The diet needs to be individualized to suit patient tolerances and preferences and to avoid unnecessary restrictions. Generally, the foods emphasized in a low fiber diet have no skins or seeds. They may have been processed to remove bran, germ, or husks. Cooking may not necessarily eliminate the fiber. Cooking may, in fact, enable a greater quantity of fiber to be consumed in a lesser volume. Legumes and nuts are also restricted. The term low residue has also been used to describe low fiber diets, although the two are not the same. Residue refers to any substance that adds to bowel (colonic) contents, such as sloughed cells and intestinal bacteria,  in addition to fiber. Residue-containing foods, prunes and prune juice, milk, and connective tissue from meats may also need to be eliminated. It is important to eliminate these foods during sudden (acute) attacks of inflammatory bowel disease, when there is a partial obstruction due to another  reason, or when minimal fecal output is desired. When these problems are gone, a more normal diet may be used. PURPOSE  Prevent blockage of a partially obstructed or narrowed gastrointestinal tract.   Reduce stool weight and volume.   Slow the movement of waste.  WHEN IS THIS DIET USED?  Acute phase of Crohn's disease, ulcerative colitis, regional enteritis, or diverticulitis.   Narrowing (stenosis) of intestinal or esophageal tubes (lumina).   Transitional diet following surgery, injury (trauma), or illness.  ADEQUACY This diet is nutritionally adequate based on individual food choices according to the Recommended Dietary Allowances of the [employer]. CHOOSING FOODS Check labels, especially on foods from the starch list. Often, dietary fiber content is listed with the Nutrition Facts panel.  Breads and Starches  Allowed: White, [place], and pita breads, plain rolls, buns, or sweet rolls, doughnuts, waffles, pancakes, bagels. Plain muffins, sweet breads, biscuits, matzoth. Flour. Soda, saltine, or graham crackers. Pretzels, rusks, melba toast, zwieback. Cooked cereals: cornmeal, farina, cream cereals. Dry cereals: refined corn, wheat, rice, and oat cereals (check label). Potatoes prepared any way without skins, refined macaroni, spaghetti, noodles, refined rice.   Avoid: Bread, rolls, or crackers made with whole-wheat, multigrains, rye, bran seeds, nuts, or coconut. Corn tortillas, table-shells. Corn chips, tortilla chips. Cereals containing whole-grains, multigrains, bran, coconut, nuts, or raisins. Cooked or dry oatmeal. Coarse wheat cereals, granola. Cereals advertised as "high fiber." Potato skins. Whole-grain pasta, wild or brown rice. Popcorn.  Vegetables  Allowed:  Strained tomato and vegetable juices. Fresh: tender lettuce, cucumber, cabbage, spinach, bean sprouts. Cooked, canned: asparagus, bean sprouts, cut green or wax beans, cauliflower, pumpkin, beets, mushrooms,  olives, spinach, yellow squash, tomato, tomato sauce (no seeds), zucchini (peeled), turnips. Canned sweet potatoes. Small amounts of celery, onion, radish, and green pepper may be used. Keep servings limited to  cup.   Avoid: Fresh, cooked, or canned: artichokes, baked beans, beet greens, broccoli, Brussels sprouts, French-style green beans, corn, kale, legumes, peas, sweet potatoes. Cooked: green or red cabbage, spinach. Avoid large servings of any vegetables.  Fruit  Allowed:  All fruit juices except prune juice. Cooked or canned: apricots applesauce, cantaloupe, cherries, grapefruit, grapes, kiwi, mandarin oranges, peaches, pears, fruit cocktail, pineapple, plums, watermelon. Fresh: banana, grapes, cantaloupe, avocado, cherries, pineapple, grapefruit, kiwi, nectarines, peaches, oranges, blueberries, plums. Keep servings limited to  cup or 1 piece.   Avoid: Fresh: apple with or without skin, apricots, mango, pears, raspberries, strawberries. Prune juice, stewed or dried prunes. Dried fruits, raisins, dates. Avoid large servings of all fresh fruits.  Meat and Meat Substitutes  Allowed:  Ground or well-cooked tender beef, ham, veal, lamb, pork, or poultry. Eggs, plain cheese. Fish, oysters, shrimp, lobster, other seafood. Liver, organ meats.   Avoid: Tough, fibrous meats with gristle. Peanut butter, smooth or chunky. Cheese with seeds, nuts, or other foods not allowed. Nuts, seeds, legumes, dried peas, beans, lentils.  Milk  Allowed:  All milk products except those not allowed. Milk and milk product consumption should be minimal when low residue is desired.   Avoid: Yogurt that contains nuts or seeds.  Soups and Combination Foods  Allowed:  Bouillon, broth, or cream soups made from allowed foods. Any strained soup. Casseroles or  mixed dishes made with allowed foods.   Avoid: Soups made from vegetables that are not allowed or that contain other foods not allowed.  Desserts and  Sweets  Allowed:  Plain cakes and cookies, pie made with allowed fruit, pudding, custard, cream pie. Gelatin, fruit, ice, sherbet, frozen ice pops. Ice cream, ice milk without nuts. Plain hard candy, honey, jelly, molasses, syrup, sugar, chocolate syrup, gumdrops, marshmallows.   Avoid: Desserts, cookies, or candies that contain nuts, peanut butter, or dried fruits. Jams, preserves with seeds, marmalade.  Fats and Oils  Allowed:  Margarine, butter, cream, mayonnaise, salad oils, plain salad dressings made from allowed foods. Plain gravy, crisp bacon without rind.   Avoid: Seeds, nuts, olives. Avocados.  Beverages  Allowed:  All, except those listed to avoid.   Avoid: Fruit juices with high pulp, prune juice.  Condiments  Allowed:  Ketchup, mustard, horseradish, vinegar, cream sauce, cheese sauce, cocoa powder. Spices in moderation: allspice, basil, bay leaves, celery powder or leaves, cinnamon, cumin powder, curry powder, ginger, mace, marjoram, onion or garlic powder, oregano, paprika, parsley flakes, ground pepper, rosemary, sage, savory, tarragon, thyme, turmeric.   Avoid: Coconut, pickles.  SAMPLE MEAL PLAN The following menu is provided as a sample. Your daily menu plans will vary. Be sure to include a minimum of the following each day in order to provide essential nutrients for the adult:  Starch/Bread/Cereal Group, 6 servings.   Fruit/Vegetable Group, 5 servings.   Meat/Meat Substitute Group, 2 servings.   Milk/Milk Substitute Group, 2 servings.  A serving is equal to  cup for fruits, vegetables, and cooked cereals or 1 piece for foods such as a piece of bread, 1 orange, or 1 apple. For dry cereals and crackers, use serving sizes listed on the label. Combination foods may count as full or partial servings from various food groups. Fats, desserts, and sweets may be added to the meal plan after the requirements for essential nutrients are met. SAMPLE MENU Breakfast   cup  orange juice.   1 boiled egg.   1 slice white toast.   Margarine.    cup cornflakes.   1 cup milk.   Beverage.  Lunch   cup chicken noodle soup.   2 to 3 oz sliced roast beef.   2 slices seedless rye bread.   Mayonnaise.    cup tomato juice.   1 small banana.   Beverage.  Dinner  3 oz baked chicken.    cup scalloped potatoes.    cup cooked beets.   White dinner roll.   Margarine.    cup canned peaches.   Beverage.  Document Released: 05/07/2002 Document Revised: 11/04/2011 Document Reviewed: 10/18/2011  [facility] - DISCHARGE INSTRUCTIONS TO PATIENT  Return to work on:  06/14/2012  Activity:  Driving - may drive in 4 to 5 days, if doing well.   Lifting - no lifting greater than 15 pounds for 2 weeks.  Wound Care:   Change dressing twice a day.  Pack with saline gauze.  May shower.  Home health care consulted.        Diet:  As tolerated  Follow up appointment:  Call Dr. Ardine Eng office [facility]) at [phone] for an appointment in 5 - 7 days.        Make appt with Dr. Sharia Reeve in 6 to 8 weeks.  Medications and dosages:  Resume your home medications.  You have a prescription for:  vicodin and Flagyl.  Call Dr. Gerrit Friends  or his office  [phone]) if you have:  Temperature greater than 100.4,  Persistent nausea and vomiting,  Severe uncontrolled pain,  Redness, tenderness, or signs of infection (pain, swelling, redness, odor or green/yellow discharge around the site),  Difficulty breathing, headache or visual disturbances,  Any other questions or concerns you may have after discharge.  In an emergency, call 911 or go to an Emergency Department at a nearby hospital.   Colitis Colitis is inflammation of the colon. Colitis can be a short-term or long-standing (chronic) illness. Crohn's disease and ulcerative colitis are 2 types of colitis which are chronic. They usually require lifelong treatment. CAUSES   There are many different causes of colitis, including:  Viruses.   Germs (bacteria).   Medicine reactions.  SYMPTOMS   Diarrhea.   Intestinal bleeding.   Pain.   Fever.   Throwing up (vomiting).   Tiredness (fatigue).   Weight loss.   Bowel blockage.  DIAGNOSIS  The diagnosis of colitis is based on examination and stool or blood tests. X-rays, CT scan, and colonoscopy may also be needed. TREATMENT  Treatment may include:  Fluids given through the vein (intravenously).   Bowel rest (nothing to eat or drink for a period of time).   Medicine for pain and diarrhea.   Medicines (antibiotics) that kill germs.   Cortisone medicines.   Surgery.  HOME CARE INSTRUCTIONS   Get plenty of rest.   Drink enough water and fluids to keep your urine clear or pale yellow.   Eat a well-balanced diet.   Call your caregiver for follow-up as recommended.  SEEK IMMEDIATE MEDICAL CARE IF:   You develop chills.   You have an oral temperature above 102 F (38.9 C), not controlled by medicine.   You have extreme weakness, fainting, or dehydration.   You have repeated vomiting.   You develop severe belly (abdominal) pain or are passing bloody or tarry stools.  MAKE SURE YOU:   Understand these instructions.   Will watch your condition.   Will get help right away if you are not doing well or get worse.  Document Released: 12/23/2004 Document Revised: 11/04/2011 Document Reviewed: 03/20/2010 [facility] Patient Information 2012 [place], [place].Low Fiber and Residue Restricted Diet A low fiber diet restricts foods that contain carbohydrates that are not digested in the small intestine. A diet containing about 10 g of fiber is considered low fiber. The diet needs to be individualized to suit patient tolerances and preferences and to avoid unnecessary restrictions. Generally, the foods emphasized in a low fiber diet have no skins or seeds. They may have been processed to remove  bran, germ, or husks. Cooking may not necessarily eliminate the fiber. Cooking may, in fact, enable a greater quantity of fiber to be consumed in a lesser volume. Legumes and nuts are also restricted. The term low residue has also been used to describe low fiber diets, although the two are not the same. Residue refers to any substance that adds to bowel (colonic) contents, such as sloughed cells and intestinal bacteria, in addition to fiber. Residue-containing foods, prunes and prune juice, milk, and connective tissue from meats may also need to be eliminated. It is important to eliminate these foods during sudden (acute) attacks of inflammatory bowel disease, when there is a partial obstruction due to another reason, or when minimal fecal output is desired. When these problems are gone, a more normal diet may be used. PURPOSE  Prevent blockage of a partially obstructed or narrowed gastrointestinal  tract.   Reduce stool weight and volume.   Slow the movement of waste.  WHEN IS THIS DIET USED?  Acute phase of Crohn's disease, ulcerative colitis, regional enteritis, or diverticulitis.   Narrowing (stenosis) of intestinal or esophageal tubes (lumina).   Transitional diet following surgery, injury (trauma), or illness.  ADEQUACY This diet is nutritionally adequate based on individual food choices according to the Recommended Dietary Allowances of the [employer]. CHOOSING FOODS Check labels, especially on foods from the starch list. Often, dietary fiber content is listed with the Nutrition Facts panel.  Breads and Starches  Allowed: White, [place], and pita breads, plain rolls, buns, or sweet rolls, doughnuts, waffles, pancakes, bagels. Plain muffins, sweet breads, biscuits, matzoth. Flour. Soda, saltine, or graham crackers. Pretzels, rusks, melba toast, zwieback. Cooked cereals: cornmeal, farina, cream cereals. Dry cereals: refined corn, wheat, rice, and oat cereals (check label).  Potatoes prepared any way without skins, refined macaroni, spaghetti, noodles, refined rice.   Avoid: Bread, rolls, or crackers made with whole-wheat, multigrains, rye, bran seeds, nuts, or coconut. Corn tortillas, table-shells. Corn chips, tortilla chips. Cereals containing whole-grains, multigrains, bran, coconut, nuts, or raisins. Cooked or dry oatmeal. Coarse wheat cereals, granola. Cereals advertised as "high fiber." Potato skins. Whole-grain pasta, wild or brown rice. Popcorn.  Vegetables  Allowed:  Strained tomato and vegetable juices. Fresh: tender lettuce, cucumber, cabbage, spinach, bean sprouts. Cooked, canned: asparagus, bean sprouts, cut green or wax beans, cauliflower, pumpkin, beets, mushrooms, olives, spinach, yellow squash, tomato, tomato sauce (no seeds), zucchini (peeled), turnips. Canned sweet potatoes. Small amounts of celery, onion, radish, and green pepper may be used. Keep servings limited to  cup.   Avoid: Fresh, cooked, or canned: artichokes, baked beans, beet greens, broccoli, Brussels sprouts, French-style green beans, corn, kale, legumes, peas, sweet potatoes. Cooked: green or red cabbage, spinach. Avoid large servings of any vegetables.  Fruit  Allowed:  All fruit juices except prune juice. Cooked or canned: apricots applesauce, cantaloupe, cherries, grapefruit, grapes, kiwi, mandarin oranges, peaches, pears, fruit cocktail, pineapple, plums, watermelon. Fresh: banana, grapes, cantaloupe, avocado, cherries, pineapple, grapefruit, kiwi, nectarines, peaches, oranges, blueberries, plums. Keep servings limited to  cup or 1 piece.   Avoid: Fresh: apple with or without skin, apricots, mango, pears, raspberries, strawberries. Prune juice, stewed or dried prunes. Dried fruits, raisins, dates. Avoid large servings of all fresh fruits.  Meat and Meat Substitutes  Allowed:  Ground or well-cooked tender beef, ham, veal, lamb, pork, or poultry. Eggs, plain cheese. Fish, oysters,  shrimp, lobster, other seafood. Liver, organ meats.   Avoid: Tough, fibrous meats with gristle. Peanut butter, smooth or chunky. Cheese with seeds, nuts, or other foods not allowed. Nuts, seeds, legumes, dried peas, beans, lentils.  Milk  Allowed:  All milk products except those not allowed. Milk and milk product consumption should be minimal when low residue is desired.   Avoid: Yogurt that contains nuts or seeds.  Soups and Combination Foods  Allowed:  Bouillon, broth, or cream soups made from allowed foods. Any strained soup. Casseroles or mixed dishes made with allowed foods.   Avoid: Soups made from vegetables that are not allowed or that contain other foods not allowed.  Desserts and Sweets  Allowed:  Plain cakes and cookies, pie made with allowed fruit, pudding, custard, cream pie. Gelatin, fruit, ice, sherbet, frozen ice pops. Ice cream, ice milk without nuts. Plain hard candy, honey, jelly, molasses, syrup, sugar, chocolate syrup, gumdrops, marshmallows.   Avoid: Desserts, cookies, or candies that  contain nuts, peanut butter, or dried fruits. Jams, preserves with seeds, marmalade.  Fats and Oils  Allowed:  Margarine, butter, cream, mayonnaise, salad oils, plain salad dressings made from allowed foods. Plain gravy, crisp bacon without rind.   Avoid: Seeds, nuts, olives. Avocados.  Beverages  Allowed:  All, except those listed to avoid.   Avoid: Fruit juices with high pulp, prune juice.  Condiments  Allowed:  Ketchup, mustard, horseradish, vinegar, cream sauce, cheese sauce, cocoa powder. Spices in moderation: allspice, basil, bay leaves, celery powder or leaves, cinnamon, cumin powder, curry powder, ginger, mace, marjoram, onion or garlic powder, oregano, paprika, parsley flakes, ground pepper, rosemary, sage, savory, tarragon, thyme, turmeric.   Avoid: Coconut, pickles.  SAMPLE MEAL PLAN The following menu is provided as a sample. Your daily menu plans will vary. Be sure to  include a minimum of the following each day in order to provide essential nutrients for the adult:  Starch/Bread/Cereal Group, 6 servings.   Fruit/Vegetable Group, 5 servings.   Meat/Meat Substitute Group, 2 servings.   Milk/Milk Substitute Group, 2 servings.  A serving is equal to  cup for fruits, vegetables, and cooked cereals or 1 piece for foods such as a piece of bread, 1 orange, or 1 apple. For dry cereals and crackers, use serving sizes listed on the label. Combination foods may count as full or partial servings from various food groups. Fats, desserts, and sweets may be added to the meal plan after the requirements for essential nutrients are met. SAMPLE MENU Breakfast   cup orange juice.   1 boiled egg.   1 slice white toast.   Margarine.    cup cornflakes.   1 cup milk.   Beverage.  Lunch   cup chicken noodle soup.   2 to 3 oz sliced roast beef.   2 slices seedless rye bread.   Mayonnaise.    cup tomato juice.   1 small banana.   Beverage.  Dinner  3 oz baked chicken.    cup scalloped potatoes.    cup cooked beets.   White dinner roll.   Margarine.    cup canned peaches.   Beverage.  Document Released: 05/07/2002 Document Revised: 11/04/2011 Document Reviewed: 10/18/2011 [facility] Patient Information 2012 [place], [place].

## 2012-05-23 ENCOUNTER — Telehealth (INDEPENDENT_AMBULATORY_CARE_PROVIDER_SITE_OTHER): Payer: Self-pay | Admitting: General Surgery

## 2012-05-23 NOTE — Telephone Encounter (Signed)
Pt calling to follow-up on his discharge instructions to give [facility] an update on his condition and schedule a post op office appt.  He states a home health nurse is visiting and teaching him how to care for his drain and wound.  Bowel are moving daily and are of a mushy consistency.  He is feeling pretty good overall, just tired.  Working on getting his appt set and will call him back.

## 2012-05-23 NOTE — Discharge Summary (Signed)
Physician Discharge Summary  Patient ID: Samuel Montgomery MRN: [id number] DOB/AGE: 05/09/1956 56 y.o.  Admit date: 05/08/2012 Discharge date: 05/23/2012  Admission Diagnoses: 1.Crohn's ileocolitis  2) New, severe abdominal pain and tenderness x 4-5 days  Patient Active Problem List  Diagnosis  . Psoriasis  . GERD (gastroesophageal reflux disease)  . Bilateral inguinal hernia  . Personal history of colonic polyps-rectal adenoma  . Crohn's ileocolitis  . Cigarette smoker  . Calcified granuloma of lung  . Alcohol dependence      Discharge Diagnoses: Crohn's disease with perforation of terminal ileum, intraabdominal abscesses. S/P Exploratory laparotomy.  2. Ileocecectomy.  3. Drainage of intraabdominal abscess 05/14/12. Dr. Gerrit Friends  Cirrhosis by CT/hx of ETOH use 1/2 gal bourbon/wk.  Tobacco use, 1.5 PPD since teen years/COPD  MJ use  Bilateral inguinal hernias  Hx gerd  hX OF psoriasis  Active Problems:  * No active hospital problems. *    PROCEDURES: 1. Exploratory laparotomy 2. Ileocecectomy with primary anastomosis 3. Drainage of intra-abdominal abscesses 05/14/2012 Dr. Gerrit Friends.     Hospital Course: Patient is a middle-aged white man diagnosed with Crohn's ileocolitis by colonoscopy and CT enterography within the last month. He was having diarrhea but no pain problems. The diarrhea was controlled by loperamide. He had a calcified granuloma on chest CT. This raises questions of late and granulomatous disease so he was seen by infectious disease. A battery of serologic studies recommended all came back negative for tuberculosis, and other fungal infections that might be represented by this lesion. The patient had been concerned about using biologic therapy overall, and was somewhat reluctant to pursue Crohn's disease specific therapy though in general had come around to the idea. The lab tests had just come back in the last couple of days, but prior to that, about 4 days ago he noted  increasing abdominal pain and he called [place] over the weekend. He was given an appointment for the off this today.  He is describing right lower quadrant and periumbilical pain that radiates upward. It is intermittent and severe and sharp. It can be worse with movement. His appetite is off. He has been quitting cigarettes, and notes that he had some nausea associated with that, but has not really vomited. He is passing small amounts of flatus and continues to have diarrhea. He has not had a fever. He has not been able to work today. He does not report any rectal bleeding. He was found to have free intraperitoneal air with a question of Crohn's, diverticular disease or PUD. He was placed on antibiotics and bowel rest, and antibiotics.  He never got better or worse. Repeat CT scan showed perforation of the distal ileum, with mutifocal intra abdominal and pelvic abscess, extensive inflammatory changes throughout the small bowel.  He was taken to the OR 05/14/12 with the above procedure. Post op he did quite well.  His abdominal wound was left partially open and he was place on wet to dry dressing changes. He was advanced to a soft diet and was discharged yt 05/21/12 by Dr. Ezzard Standing  He is to follow up with Dr. Gerrit Friends in 1 week.  Disposition: 06-Home-Health Care Svc  Discharge Orders    Future Orders Please Complete By Expires   Diet - low sodium heart healthy      Increase activity slowly        Medication List  As of 05/23/2012  2:13 PM   TAKE these medications  aspirin 81 MG chewable tablet   Chew 81 mg by mouth daily.      HYDROcodone-acetaminophen 5-325 MG per tablet   Commonly known as: NORCO   Take 1-2 tablets by mouth every 6 (six) hours as needed.      Loperamide HCl 2 MG Chew   Take one tab two  times a day as needed for diarrhea      metroNIDAZOLE 500 MG tablet   Commonly known as: FLAGYL   Take 1 tablet (500 mg total) by mouth 3 (three) times daily.      multivitamin with  minerals Tabs   Take 1 tablet by mouth daily.           Follow-up Information    Follow up with Velora Heckler, MD. Schedule an appointment as soon as possible for a visit in 1 week.   Contact information:   [employer] [facility] [address], [address] [place] [address] [phone]       Follow up with Lucilla Edin, MD. Schedule an appointment as soon as possible for a visit in 2 weeks.   Contact information:   [address] [place] [address] [phone]       Follow up with Stan Head, MD. Schedule an appointment as soon as possible for a visit in 2 weeks.   Contact information:   [address] [facility] [address] [place] [address] [place] [address] [phone]          Signed: Sherrie George 05/23/2012, 2:13 PM   He was doing well.  He understood how to change his dressing.  He had home health care coming out to help with the wound.  Ovidio Kin, MD, [facility] Pager: [phone] Office phone:  [phone]

## 2012-05-26 ENCOUNTER — Ambulatory Visit (INDEPENDENT_AMBULATORY_CARE_PROVIDER_SITE_OTHER): Payer: BC Managed Care – PPO | Admitting: Surgery

## 2012-05-26 ENCOUNTER — Encounter (INDEPENDENT_AMBULATORY_CARE_PROVIDER_SITE_OTHER): Payer: Self-pay | Admitting: Surgery

## 2012-05-26 ENCOUNTER — Encounter (INDEPENDENT_AMBULATORY_CARE_PROVIDER_SITE_OTHER): Payer: BC Managed Care – PPO | Admitting: Surgery

## 2012-05-26 VITALS — BP 110/68 | HR 68 | Temp 98.3°F | Resp 18 | Ht 64.0 in | Wt 143.8 lb

## 2012-05-26 DIAGNOSIS — K5080 Crohn's disease of both small and large intestine without complications: Secondary | ICD-10-CM

## 2012-05-26 MED ORDER — ZOLPIDEM TARTRATE ER 12.5 MG PO TBCR: 12.5000 mg | EXTENDED_RELEASE_TABLET | Freq: Every evening | ORAL | Status: DC | PRN

## 2012-05-26 NOTE — Patient Instructions (Signed)
Wound care as instructed.  Latrel Szymczak M. Juwon Scripter, MD, FACS [facility] Office: [phone]   

## 2012-05-26 NOTE — Progress Notes (Signed)
General Surgery [facility]  Visit Diagnoses: 1. Crohn's ileocolitis     HISTORY: Patient is a 56 year old white male who underwent ileocecectomy for end-stage Crohn's disease. Final pathology confirmed Crohn's ileocolitis. Patient now presents for wound check.  EXAM: Midline abdominal incision is healing nicely. The inferior portion of the wound has nearly completely closed and can be dressed with topical antibiotic ointment and a dry gauze dressing. Upper portion of the wound is still 2 cm in depth by 2 cm in width by 5 cm in length. There is exposed suture material. It is packed wet-to-dry with normal saline moistened gauze. Staples are removed from the wound today.  IMPRESSION: Status post ileocecectomy for Crohn's disease  PLAN: Patient will continue wet-to-dry dressing changes. He has requested a prescription for Ambien and I have given him 10 tablets to his pharmacy. Patient will return to see me in 3 weeks for wound check. He will schedule follow up with his gastroenterologist in the near future.  Velora Heckler, MD, FACS General & Endocrine Surgery [facility]

## 2012-05-29 ENCOUNTER — Ambulatory Visit: Payer: BC Managed Care – PPO | Admitting: Internal Medicine

## 2012-05-29 ENCOUNTER — Telehealth (INDEPENDENT_AMBULATORY_CARE_PROVIDER_SITE_OTHER): Payer: Self-pay

## 2012-05-29 NOTE — Telephone Encounter (Signed)
Pt given po appt. Pt to call with concerns.

## 2012-05-31 ENCOUNTER — Encounter (INDEPENDENT_AMBULATORY_CARE_PROVIDER_SITE_OTHER): Payer: BC Managed Care – PPO | Admitting: Surgery

## 2012-06-07 ENCOUNTER — Telehealth (INDEPENDENT_AMBULATORY_CARE_PROVIDER_SITE_OTHER): Payer: Self-pay | Admitting: General Surgery

## 2012-06-07 NOTE — Telephone Encounter (Signed)
Pt had question about a small piece of questionable tissue on the wound.  After review of notes, it may be the exposed suture mentioned.  Advised pt to leave it as is and Samuel Montgomery will address it at his next appt in a few weeks.  Samuel Montgomery understands.

## 2012-06-15 ENCOUNTER — Encounter (INDEPENDENT_AMBULATORY_CARE_PROVIDER_SITE_OTHER): Payer: Self-pay

## 2012-06-15 NOTE — Progress Notes (Signed)
We received a request for refill on [facility]codone 5/[facility] from [facility] [address]  Per Dr Ezzard Standing I faxed the order back for Hydrocodone- Acetaminophen 5/325 take 1 to 2 tabs by mouth every 6 hours as needed for pain #30 with no refill.  [facility] [phone]

## 2012-06-21 ENCOUNTER — Ambulatory Visit (INDEPENDENT_AMBULATORY_CARE_PROVIDER_SITE_OTHER): Payer: BC Managed Care – PPO | Admitting: Surgery

## 2012-06-21 ENCOUNTER — Encounter (INDEPENDENT_AMBULATORY_CARE_PROVIDER_SITE_OTHER): Payer: Self-pay | Admitting: Surgery

## 2012-06-21 ENCOUNTER — Telehealth: Payer: Self-pay | Admitting: Internal Medicine

## 2012-06-21 VITALS — BP 130/68 | HR 72 | Temp 97.6°F | Ht 64.0 in | Wt 142.2 lb

## 2012-06-21 DIAGNOSIS — K5080 Crohn's disease of both small and large intestine without complications: Secondary | ICD-10-CM

## 2012-06-21 NOTE — Progress Notes (Signed)
General Surgery [facility]  Visit Diagnoses: 1. Crohn's ileocolitis     HISTORY: Patient returns for her second postoperative visit having undergone ileocecectomy for Crohn's disease. Wound is healing by secondary intention. They continue to do wet-to-dry dressing changes.  EXAM: Midline abdominal incision is nearly completely epithelialized. There is a small area in the upper midline measuring 2 x 1 cm which is quite superficial with clean granulation tissue. Antibiotic ointment and a dry gauze dressing is placed.  IMPRESSION: Status post ileocecectomy for Crohn's disease  PLAN: Wound continues to heal by secondary intention. At this point it is quite superficial and can be treated with topical antibiotic ointment only. Patient's lifting is restricted to 15-20 pounds. He will return in 6 weeks for final wound check. Patient is encouraged to followup with his gastroenterologist in the near future.  Velora Heckler, MD, FACS General & Endocrine Surgery [facility]

## 2012-06-21 NOTE — Patient Instructions (Signed)
Antibiotic ointment to wound 3 times a day.  Shower once daily.

## 2012-06-21 NOTE — Telephone Encounter (Signed)
Patient scheduled for tomorrow for f/u after having surgery. He has not been officially released from [facility] but the next appointment available will be after he returns to work. He wants to know if he should keep the appointment tomorrow. Suggested he keep this.

## 2012-06-22 ENCOUNTER — Ambulatory Visit (INDEPENDENT_AMBULATORY_CARE_PROVIDER_SITE_OTHER): Payer: BC Managed Care – PPO | Admitting: Internal Medicine

## 2012-06-22 ENCOUNTER — Encounter: Payer: Self-pay | Admitting: Internal Medicine

## 2012-06-22 ENCOUNTER — Other Ambulatory Visit (INDEPENDENT_AMBULATORY_CARE_PROVIDER_SITE_OTHER): Payer: BC Managed Care – PPO

## 2012-06-22 VITALS — BP 120/60 | HR 68 | Ht 64.0 in | Wt 144.0 lb

## 2012-06-22 DIAGNOSIS — D649 Anemia, unspecified: Secondary | ICD-10-CM

## 2012-06-22 DIAGNOSIS — K603 Anal fistula: Secondary | ICD-10-CM

## 2012-06-22 DIAGNOSIS — K5080 Crohn's disease of both small and large intestine without complications: Secondary | ICD-10-CM

## 2012-06-22 LAB — CBC WITH DIFFERENTIAL/PLATELET
Basophils Absolute: 0 10*3/uL (ref 0.0–0.1)
Eosinophils Absolute: 0.3 10*3/uL (ref 0.0–0.7)
HCT: 37.5 % — ABNORMAL LOW (ref 39.0–52.0)
Hemoglobin: 12.5 g/dL — ABNORMAL LOW (ref 13.0–17.0)
Lymphs Abs: 2.2 10*3/uL (ref 0.7–4.0)
MCHC: 33.3 g/dL (ref 30.0–36.0)
MCV: 87.3 fl (ref 78.0–100.0)
Monocytes Absolute: 1.3 10*3/uL — ABNORMAL HIGH (ref 0.1–1.0)
Neutro Abs: 12.9 10*3/uL — ABNORMAL HIGH (ref 1.4–7.7)
RDW: 14.1 % (ref 11.5–14.6)

## 2012-06-22 LAB — COMPREHENSIVE METABOLIC PANEL
ALT: 85 U/L — ABNORMAL HIGH (ref 0–53)
AST: 30 U/L (ref 0–37)
Creatinine, Ser: 0.9 mg/dL (ref 0.4–1.5)
Total Bilirubin: 0.6 mg/dL (ref 0.3–1.2)

## 2012-06-22 NOTE — Patient Instructions (Addendum)
Your physician has requested that you go to the basement for lab work before leaving today.  Please go see your primary care physician regarding the vaccinations you and Dr. Leone Payor discussed.  Follow up will be arranged after lab work results have been obtained

## 2012-06-22 NOTE — Progress Notes (Signed)
Subjective:    Patient ID: Samuel Montgomery, male    DOB: [birth year], 56 y.o.   MRN: [id number]  HPI Samuel Montgomery returns in followup after he had a resection for Crohn's disease and perforation. He had this in late June after week of antibiotics failed to resolve the issue. He is starting to feel stronger though he admits she was quite weak. He lost weight and cannot regain yet. Appetite is coming back. He has been cleared for physical activity though he cannot strained or pulled sure perform activities the cause abdominal wall tension.  He continues to have a nodule in the perianal area that he says will express whitish or yellowish fluid at times. His bowel movements are normal performing upper she will.  He also describes thickening of the vein in the left forearm where there was a troublesome IV, it is mildly tender. Medications, allergies, past medical history, past surgical history, family history and social history are reviewed and updated in the EMR.   Review of Systems As per history of present illness    Objective:   Physical Exam General:  NAD Eyes:   anicteric Abdomen:  soft with a lower midline abdominal scar. In the middle of the scar above the umbilicus there is a small area healing by secondary intent it looks healthy overall. Rectal: In the knee-chest physician, on the right side of the anus there is a firm indurated slightly tender area. I do not see any opening though he claims he can express fluid from this. There is no dermatitis.    Data Reviewed:  Hospitalization records, operative note pathology Lab Results  Component Value Date   WBC 9.6 05/20/2012   HGB 10.5* 05/20/2012   HCT 31.7* 05/20/2012   MCV 88.5 05/20/2012   PLT 667* 05/20/2012     Chemistry      Component Value Date/Time   NA 135 05/20/2012 0354   K 3.8 05/20/2012 0354   CL 104 05/20/2012 0354   CO2 25 05/20/2012 0354   BUN 5* 05/20/2012 0354   CREATININE 0.81 05/20/2012 0354   CREATININE 0.91 01/04/2012 1230        Component Value Date/Time   CALCIUM 7.7* 05/20/2012 0354   ALKPHOS 143* 05/20/2012 0354   AST 19 05/20/2012 0354   ALT 11 05/20/2012 0354   BILITOT 0.2* 05/20/2012 0354             Assessment & Plan:   1. Crohn's ileocolitis   We have discussed and I think the plan is to continue with biologic therapy. He did not make it to that because of his need to have his granuloma evaluated for possible active infection as well as a perforation and peritonitis and subsequent surgery. The presence of what looks to be a perianal fistula and/or abscess suggests the need for the biologic as well. He is artery had the risks benefits and indications explained.  Prior to his immunosuppression I think he should receive all vaccinations that might be needed including a zoster vaccine there was had shingles, check Tdap, Pneumovax, and will check hepatitis A serology today to see if he needs that. He does need a hepatitis B vaccine also.   2. 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 I discussed options of MRI imaging are possible in the anal ultrasound, versus having his surgeon reevaluate this. I will contact the  surgeon and make a decision and let him know. I do think this is an indication that he on an aggressive Crohn's therapy with biologics  3. Anemia, unspecified   Complete blood count will be rechecked.     I appreciate the opportunity to care for this patient.   CC: DAUB, Stan Head, MD and TODD Gerrit Friends, MD

## 2012-06-22 NOTE — Assessment & Plan Note (Deleted)
S/p surgery humira (other) event Perianal abscess/fistula

## 2012-06-23 NOTE — Progress Notes (Signed)
Quick Note:  WBC is elevated - not sure why - he looked good but there is ? of perianal abscess/fistula Mild alk phos and ALT elevation could be from healing muscle  I think we should go ahead with MRI of the pelvis to evaluate the suspected perianal fistula and possible abscess- could be something bad developing  If he is agreeable then please check with radiology how to order the MR (without contrast or with?) ______

## 2012-06-27 NOTE — Progress Notes (Signed)
Quick Note:  I recommend he set up an MR pelvis now to evaluate suspected perianal abscess and possible fistula - need to ask radiology if it should be without or with contrast ______

## 2012-06-27 NOTE — Progress Notes (Signed)
Quick Note:  He will follow-up with Dr. Gerrit Friends as planned  I thought about it more and think the MRI is appropriate and will be helpful ______

## 2012-06-28 ENCOUNTER — Other Ambulatory Visit: Payer: Self-pay

## 2012-06-28 ENCOUNTER — Telehealth (INDEPENDENT_AMBULATORY_CARE_PROVIDER_SITE_OTHER): Payer: Self-pay | Admitting: General Surgery

## 2012-06-28 DIAGNOSIS — K610 Anal abscess: Secondary | ICD-10-CM

## 2012-06-28 NOTE — Telephone Encounter (Signed)
Norco 5/325 #20 with no refills approved per Dr Jamey Ripa and [facility] [phone]

## 2012-06-30 ENCOUNTER — Ambulatory Visit
Admission: RE | Admit: 2012-06-30 | Discharge: 2012-06-30 | Disposition: A | Discharge status: Home / Self Care | Payer: BC Managed Care – PPO | Source: Ambulatory Visit | Attending: Internal Medicine | Admitting: Internal Medicine

## 2012-06-30 ENCOUNTER — Telehealth: Payer: Self-pay | Admitting: Gastroenterology

## 2012-06-30 DIAGNOSIS — K610 Anal abscess: Secondary | ICD-10-CM

## 2012-06-30 MED ORDER — METRONIDAZOLE 250 MG PO TABS: 250.0000 mg | ORAL_TABLET | Freq: Three times a day (TID) | ORAL | Status: AC

## 2012-06-30 MED ORDER — GADOBENATE DIMEGLUMINE 529 MG/ML IV SOLN
13.0000 mL | Freq: Once | INTRAVENOUS | Status: AC | PRN
  Administered 2012-06-30: 13 mL via INTRAVENOUS

## 2012-06-30 MED ORDER — CIPROFLOXACIN HCL 500 MG PO TABS: 500.0000 mg | ORAL_TABLET | Freq: Two times a day (BID) | ORAL | Status: AC

## 2012-06-30 NOTE — Telephone Encounter (Signed)
He called tonight.  HAs been feeling just overall tired, achey. Tonight had fever to 101.8.  Does have mild abd pains, nausea.  No chills or specific tender spots on abd.  No coughing, no dysuria. No change in bowels.  Had perforated bowel 1-2 months ago, perhaps abscess now?  To be safe I am covering him with cipro/flagyl (10 day script for each written).  He is scheduled for MRI tonight and will go ahead with that.  I'll forward this to Dr. Leone Payor.  He may need follow up appt next week.  He knows to call if he starts feeling worse.

## 2012-07-01 ENCOUNTER — Inpatient Hospital Stay (HOSPITAL_COMMUNITY)
Admission: EM | Admit: 2012-07-01 | Discharge: 2012-07-19 | DRG: 552 | Disposition: A | Discharge status: Home / Self Care | Payer: BC Managed Care – PPO | Attending: Surgery | Admitting: Surgery

## 2012-07-01 ENCOUNTER — Encounter (HOSPITAL_COMMUNITY): Payer: Self-pay | Admitting: Emergency Medicine

## 2012-07-01 ENCOUNTER — Telehealth: Payer: Self-pay | Admitting: Gastroenterology

## 2012-07-01 ENCOUNTER — Emergency Department (HOSPITAL_COMMUNITY): Payer: BC Managed Care – PPO

## 2012-07-01 DIAGNOSIS — J449 Chronic obstructive pulmonary disease, unspecified: Secondary | ICD-10-CM | POA: Diagnosis present

## 2012-07-01 DIAGNOSIS — J4489 Other specified chronic obstructive pulmonary disease: Secondary | ICD-10-CM | POA: Diagnosis present

## 2012-07-01 DIAGNOSIS — F17200 Nicotine dependence, unspecified, uncomplicated: Secondary | ICD-10-CM | POA: Diagnosis present

## 2012-07-01 DIAGNOSIS — K5080 Crohn's disease of both small and large intestine without complications: Secondary | ICD-10-CM | POA: Diagnosis present

## 2012-07-01 DIAGNOSIS — K219 Gastro-esophageal reflux disease without esophagitis: Secondary | ICD-10-CM | POA: Diagnosis present

## 2012-07-01 DIAGNOSIS — K4020 Bilateral inguinal hernia, without obstruction or gangrene, not specified as recurrent: Secondary | ICD-10-CM | POA: Diagnosis present

## 2012-07-01 DIAGNOSIS — K651 Peritoneal abscess: Principal | ICD-10-CM | POA: Diagnosis present

## 2012-07-01 DIAGNOSIS — K603 Anal fistula, unspecified: Secondary | ICD-10-CM | POA: Diagnosis present

## 2012-07-01 DIAGNOSIS — Z86010 Personal history of colonic polyps: Secondary | ICD-10-CM

## 2012-07-01 DIAGNOSIS — R933 Abnormal findings on diagnostic imaging of other parts of digestive tract: Secondary | ICD-10-CM | POA: Diagnosis present

## 2012-07-01 DIAGNOSIS — E46 Unspecified protein-calorie malnutrition: Secondary | ICD-10-CM | POA: Diagnosis present

## 2012-07-01 DIAGNOSIS — Z9049 Acquired absence of other specified parts of digestive tract: Secondary | ICD-10-CM

## 2012-07-01 DIAGNOSIS — Z6825 Body mass index (BMI) 25.0-25.9, adult: Secondary | ICD-10-CM

## 2012-07-01 DIAGNOSIS — Z79899 Other long term (current) drug therapy: Secondary | ICD-10-CM

## 2012-07-01 DIAGNOSIS — D72829 Elevated white blood cell count, unspecified: Secondary | ICD-10-CM | POA: Diagnosis present

## 2012-07-01 LAB — COMPREHENSIVE METABOLIC PANEL
ALT: 134 U/L — ABNORMAL HIGH (ref 0–53)
AST: 78 U/L — ABNORMAL HIGH (ref 0–37)
Albumin: 2.7 g/dL — ABNORMAL LOW (ref 3.5–5.2)
Alkaline Phosphatase: 264 U/L — ABNORMAL HIGH (ref 39–117)
CO2: 24 mEq/L (ref 19–32)
Chloride: 91 mEq/L — ABNORMAL LOW (ref 96–112)
GFR calc non Af Amer: >90 mL/min (ref >90)
Potassium: 3.7 mEq/L (ref 3.5–5.1)
Sodium: 127 mEq/L — ABNORMAL LOW (ref 135–145)
Total Bilirubin: 0.3 mg/dL (ref 0.3–1.2)

## 2012-07-01 LAB — CBC WITH DIFFERENTIAL/PLATELET
Basophils Absolute: 0.1 10*3/uL (ref 0.0–0.1)
Basophils Relative: 0 % (ref 0–1)
HCT: 33.6 % — ABNORMAL LOW (ref 39.0–52.0)
Lymphocytes Relative: 10 % — ABNORMAL LOW (ref 12–46)
MCHC: 34.2 g/dL (ref 30.0–36.0)
Neutro Abs: 17.9 10*3/uL — ABNORMAL HIGH (ref 1.7–7.7)
Neutrophils Relative %: 80 % — ABNORMAL HIGH (ref 43–77)
RDW: 13.4 % (ref 11.5–15.5)
WBC: 22.5 10*3/uL — ABNORMAL HIGH (ref 4.0–10.5)

## 2012-07-01 LAB — URINALYSIS, ROUTINE W REFLEX MICROSCOPIC
Bilirubin Urine: NEGATIVE
Glucose, UA: NEGATIVE mg/dL
Hgb urine dipstick: NEGATIVE
Ketones, ur: NEGATIVE mg/dL
pH: 7 (ref 5.0–8.0)

## 2012-07-01 LAB — URINE MICROSCOPIC-ADD ON

## 2012-07-01 MED ORDER — SODIUM CHLORIDE 0.9 % IV SOLN
Freq: Once | INTRAVENOUS | Status: AC
  Administered 2012-07-01: 21:00:00 via INTRAVENOUS

## 2012-07-01 NOTE — Telephone Encounter (Signed)
He called again tonight.  Has been on cipro 24 hours still with fever, up to 102.6 now.  I explained to him he needs to to to ER, possibly has abscess.  Later, when I was able to check out his pelvic MR from last night I see that he does have perirectal abscess.  I let the charge nurse know about [place] and the MR finding.

## 2012-07-01 NOTE — ED Notes (Signed)
Bed:WA24<BR> Expected date:<BR> Expected time:<BR> Means of arrival:<BR> Comments:<BR> Triage 1 

## 2012-07-01 NOTE — ED Provider Notes (Addendum)
History     CSN: [id number]  Arrival date & time 07/01/12  2007   First MD Initiated Contact with Patient 07/01/12 2038      Chief Complaint  Patient presents with  . Fever    (Consider location/radiation/quality/duration/timing/severity/associated sxs/prior treatment) The history is provided by the patient.   Patient here complaining of abdominal pain and rectal pain. Symptoms within 48 hours. He has a history of Crohn's disease and spoke with his Dr. who ordered an MRI of his pelvis which showed a perianal fistula. He was placed on Cipro and Flagyl yesterday. He continues to spike temperatures up to 102.6 at home. Denies any vomiting or diarrhea. Has not taken any medications for his fever. Patient to come here for further evaluation Past Medical History  Diagnosis Date  . GERD (gastroesophageal reflux disease)   . Psoriasis   . Benign neoplasm of colon 05/20/2005    adenomatous polyp  . Diverticulosis of colon (without mention of hemorrhage)   . COPD (chronic obstructive pulmonary disease)   . Inguinal hernia     bi-lateral  . 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 ? Perianal fistula vs. Abscess      Past Surgical History  Procedure Date  . Inguinal hernia repair     bilateral   . Appendectomy 1979  . Vasectomy   . Colonoscopy w/ biopsies   . Laparotomy 05/14/2012    Procedure: EXPLORATORY LAPAROTOMY;  Surgeon: Velora Heckler, MD;  Location: [facility];  Service: General;  Laterality: N/A;  . Bowel resection 05/14/2012    Procedure: SMALL BOWEL RESECTION;  Surgeon: Velora Heckler, MD;  Location: [facility];  Service: General;  Laterality: N/A;  ileocecectomy    Family History  Problem Relation Age of Onset  . Colon polyps Sister     x 2  . Crohn's disease Sister   . Stroke Father   . Colon cancer Neg Hx   . Hyperlipidemia Mother   . GER disease Mother     History  Substance Use Topics  . Smoking status: Smoker, Current Status Unknown -- 1.0  packs/day for 40 years    Types: Cigarettes  . Smokeless tobacco: Never Used   Comment: has not had any cigarrettes since before surgery  . Alcohol Use: 5.0 oz/week    10 drink(s) per week     4-5 drinks daily./hasn't drink since surgery      Review of Systems  All other systems reviewed and are negative.    Allergies  Wellbutrin  Home Medications   Current Outpatient Rx  Name Route Sig Dispense Refill  . ASPIRIN 81 MG PO CHEW Oral Chew 81 mg by mouth daily.    Marland Kitchen CIPROFLOXACIN HCL 500 MG PO TABS Oral Take 1 tablet (500 mg total) by mouth 2 (two) times daily. 20 tablet 0  . HYDROCODONE-ACETAMINOPHEN 5-325 MG PO TABS Oral Take 1 tablet by mouth every 6 (six) hours as needed. For pain.    Marland Kitchen METRONIDAZOLE 250 MG PO TABS Oral Take 1 tablet (250 mg total) by mouth 3 (three) times daily. 30 tablet 0  . ADULT MULTIVITAMIN W/MINERALS CH Oral Take 1 tablet by mouth daily.      BP 110/59  Pulse 89  Temp 99.4 F (37.4 C) (Oral)  Resp 20  Ht 5\' 4"  (1.626 m)  Wt 140 lb (63.504 kg)  BMI 24.03 kg/m2  SpO2 97%  Physical Exam  Nursing note and vitals reviewed. Constitutional: He is  oriented to person, place, and time. Vital signs are normal. He appears well-developed and well-nourished.  Non-toxic appearance. No distress.  HENT:  Head: Normocephalic and atraumatic.  Eyes: Conjunctivae, EOM and lids are normal. Pupils are equal, round, and reactive to light.  Neck: Normal range of motion. Neck supple. No tracheal deviation present. No mass present.  Cardiovascular: Normal rate, regular rhythm and normal heart sounds.  Exam reveals no gallop.   No murmur heard. Pulmonary/Chest: Effort normal and breath sounds normal. No stridor. No respiratory distress. He has no decreased breath sounds. He has no wheezes. He has no rhonchi. He has no rales.  Abdominal: Soft. Normal appearance and bowel sounds are normal. He exhibits no distension. There is no tenderness. There is no rebound and no CVA  tenderness.  Genitourinary:       Perianal redness without drainage  Musculoskeletal: Normal range of motion. He exhibits no edema and no tenderness.  Neurological: He is alert and oriented to person, place, and time. He has normal strength. No cranial nerve deficit or sensory deficit. GCS eye subscore is 4. GCS verbal subscore is 5. GCS motor subscore is 6.  Skin: Skin is warm and dry. No abrasion and no rash noted.  Psychiatric: He has a normal mood and affect. His speech is normal and behavior is normal.    ED Course  Procedures (including critical care time)   Labs Reviewed  CBC WITH DIFFERENTIAL  COMPREHENSIVE METABOLIC PANEL  LIPASE, BLOOD  URINALYSIS, ROUTINE W REFLEX MICROSCOPIC  URINE CULTURE   Mr Pelvis W Wo Contrast  07/01/2012  *RADIOLOGY REPORT*  Clinical Data: Crohn's disease, status post surgery, rectal pain, evaluate for rectal abscess/fistula  MRI PELVIS WITHOUT AND WITH CONTRAST  Technique:  Multiplanar multisequence MR imaging of the pelvis was performed both before and after administration of intravenous contrast.  Contrast: 13mL MULTIHANCE GADOBENATE DIMEGLUMINE 529 MG/ML IV SOLN  Comparison: [facility] CT abdomen/pelvis dated 05/13/2012  Findings: 2.6 x 0.8 cm perirectal abscess in the right gluteal cleft (series 6/image 27).  This communicates with a fistulous tract (series 8/image 20) which likely arises from the rectum at the 3 o'clock position (series 8/image 21).  An additional blind-ending tract extends inferiorly along the left gluteal cleft (series 8/image 24).  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  Prostate and bladder are unremarkable.  No focal osseous lesions.  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  Associated 2.6 x 0.8 cm perirectal abscess in the right gluteal cleft.  Original Report Authenticated By: Charline Bills, M.D.     No diagnosis  found.    MDM  Spoke with Dr. Ezzard Standing from general surgery and he will come and see the        Toy Baker, MD 07/01/12 2118  Toy Baker, MD 07/01/12 2352

## 2012-07-01 NOTE — ED Notes (Signed)
Pt alert, arrives from home, c/o fever, recent GI sx, bowel section removals, per Dr Lovell Sheehan, informed this RN, pt has peri- rectal abscess, recent MRI last PM, resp even unlabored, skin pwd

## 2012-07-01 NOTE — ED Notes (Signed)
Pt states he had sx in June for a bowel resection r/t his Chron's disease. He began running a fever.

## 2012-07-01 NOTE — H&P (Signed)
Re:   Samuel Montgomery DOB:   [birth year] MRN:   [id number]  ASSESSMENT AND PLAN: 1.  Right peri-rectal abscess on MRI - 06/30/2012  ? Grade IV trans-sphincteric perianl fistula   But on PE - the patient has no mass or tenderness.  I am not sure this is the source of his problems.  1a.  Leukocytosis - unexplained  No abdominal or rectal symptoms.  Will plan CT abdomen/pelvis, check C. Diff, place on empiric antibx, follow CBC's  2. EXPLORATORY LAPAROTOMY, ileocectomy, drainage of abscess - T. Gerkin - 05/14/2012   Has done well from surgery, as best I can tell.  3.. Crohn's disease.  Followed by Dr. Leone Payor.  I spoke to Dr. Christella Hartigan on the phone.  4. COPD  5. GERD  6. Smokes -  He says that he has not smoked since the surgery. 7.  Bilateral inguinal hernias.  R>L.  Chief Complaint  Patient presents with  . Fever   REFERRING PHYSICIAN: DAUB, STEVE A, MD  HISTORY OF PRESENT ILLNESS: Samuel Montgomery is a 56 y.o. (DOB: 05-26-1956)  white male whose primary care physician is DAUB, STEVE A, MD and comes to the [facility] with increasing peri anal pain and fever.  He says that he has never "gotten over" the surgery.  He had an ileo-cectomy and drainage of abscess by Dr. Nat Christen on 05/14/2012.   But the last 2 weeks he has been diaphoretic, not feeling good, had a decreased appetite.  His bowels tend to be loose, but he actually had a solid BM yesterday. They're back loose today.  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  He has not had a peri anal abscess.  He saw Dr. Leone Payor with an increased WBC.  He obtained a MRI of his pelvis and there is the suggestion a 2 cm abscess in the right rectal area.  But the patient is having no pain there.  He got started on Flagyl yesterday (one day), but with continued fever and diaphoresis, he came to the [facility].    Past Medical History  Diagnosis Date  . GERD (gastroesophageal reflux disease)   . Psoriasis   . Benign neoplasm of colon  05/20/2005    adenomatous polyp  . Diverticulosis of colon (without mention of hemorrhage)   . COPD (chronic obstructive pulmonary disease)   . Inguinal hernia     bi-lateral  . 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 ? Perianal fistula vs. Abscess        Past Surgical History  Procedure Date  . Inguinal hernia repair     bilateral   . Appendectomy 1979  . Vasectomy   . Colonoscopy w/ biopsies   . Laparotomy 05/14/2012    Procedure: EXPLORATORY LAPAROTOMY;  Surgeon: Velora Heckler, MD;  Location: [facility];  Service: General;  Laterality: N/A;  . Bowel resection 05/14/2012    Procedure: SMALL BOWEL RESECTION;  Surgeon: Velora Heckler, MD;  Location: [facility];  Service: General;  Laterality: N/A;  ileocecectomy      Current Facility-Administered Medications  Medication Dose Route Frequency Provider Last Rate Last Dose  . 0.9 %  sodium chloride infusion   Intravenous Once Toy Baker, MD       Current Outpatient Prescriptions  Medication Sig Dispense Refill  . aspirin 81 MG chewable tablet Chew 81 mg by mouth daily.      . ciprofloxacin (CIPRO)  500 MG tablet Take 1 tablet (500 mg total) by mouth 2 (two) times daily.  20 tablet  0  . HYDROcodone-acetaminophen (NORCO/VICODIN) 5-325 MG per tablet Take 1 tablet by mouth every 6 (six) hours as needed. For pain.      . metroNIDAZOLE (FLAGYL) 250 MG tablet Take 1 tablet (250 mg total) by mouth 3 (three) times daily.  30 tablet  0  . Multiple Vitamin (MULITIVITAMIN WITH MINERALS) TABS Take 1 tablet by mouth daily.          Allergies  Allergen Reactions  . Wellbutrin (Bupropion Hcl)     Neg CNS. As of 01/04/2012 patient states he is not allergic to  Wellbutrin.    REVIEW OF SYSTEMS: Skin:  No history of rash.  No history of abnormal moles. Infection:  No history of hepatitis or HIV.  No history of MRSA. Neurologic:  No history of stroke.  No history of seizure.  No history of headaches. Cardiac:  Had a couple  of stress tests which have been okay.  He implies that his cardiac workup was in part a mistake. Pulmonary:  He did smoke cigarettes until his surgery, but has not smoked since then.  Endocrine:  No diabetes. No thyroid disease. Gastrointestinal:  No history of stomach disease.  No history of liver disease.  He was diagnosed with Crohn's only a month or so before his surgery. Urologic:  No history of kidney stones.  No history of bladder infections. Musculoskeletal:  No history of joint or back disease. Hematologic:  No bleeding disorder.  No history of anemia.  Not anticoagulated. Psycho-social:  The patient is oriented.   The patient has no obvious psychologic or social impairment to understanding our conversation and plan.  SOCIAL and FAMILY HISTORY: Mother, Venita Sheffield in room. He is in the midst of a divorce. He has a son and daughter. He works for [employer], [place].  PHYSICAL EXAM: BP 110/59  Pulse 89  Temp 99.4 F (37.4 C) (Oral)  Resp 20  Ht 5\' 4"  (1.626 m)  Wt 140 lb (63.504 kg)  BMI 24.03 kg/m2  SpO2 97%  General: WN WM who is alert and generally healthy appearing.  HEENT: Normal. Pupils equal. Neck: Supple. No mass.  No thyroid mass. Lymph Nodes:  No supraclavicular or cervical nodes. Lungs: Clear to auscultation and symmetric breath sounds. Heart:  RRR. No murmur or rub.  Abdomen: Soft. No mass. No tenderness. No hernia. Normal bowel sounds.  Well healed mid line abdominal incision.  He has bilateral inguinal hernias (R>L) Rectal: I palpated all around his anus/rectum and he has no localized tenderness or mass.  I cannot correlate my exam with the MRI findings.  He points to a fistula on the right side of his recttum. Extremities:  Good strength and ROM  in upper and lower extremities. Neurologic:  Grossly intact to motor and sensory function. Psychiatric: Has normal mood and affect. Behavior is normal.   DATA REVIEWED: Data in chart. His WBC is 22,500 -  07/01/2012.  Ovidio Kin, MD,  [facility] [address] [place]  [address]   [place], [place]    [address] Phone:  [phone] FAX:  [phone]

## 2012-07-02 ENCOUNTER — Encounter (HOSPITAL_COMMUNITY): Payer: Self-pay | Admitting: Certified Registered Nurse Anesthetist

## 2012-07-02 ENCOUNTER — Inpatient Hospital Stay (HOSPITAL_COMMUNITY): Payer: BC Managed Care – PPO

## 2012-07-02 ENCOUNTER — Encounter (HOSPITAL_COMMUNITY): Payer: Self-pay | Admitting: *Deleted

## 2012-07-02 ENCOUNTER — Encounter (HOSPITAL_COMMUNITY): Admission: EM | Disposition: A | Discharge status: Home / Self Care | Payer: Self-pay | Source: Home / Self Care

## 2012-07-02 DIAGNOSIS — R933 Abnormal findings on diagnostic imaging of other parts of digestive tract: Secondary | ICD-10-CM | POA: Insufficient documentation

## 2012-07-02 LAB — CBC WITH DIFFERENTIAL/PLATELET
Eosinophils Absolute: 0.1 10*3/uL (ref 0.0–0.7)
Eosinophils Relative: 1 % (ref 0–5)
HCT: 34 % — ABNORMAL LOW (ref 39.0–52.0)
Hemoglobin: 11.5 g/dL — ABNORMAL LOW (ref 13.0–17.0)
Lymphocytes Relative: 9 % — ABNORMAL LOW (ref 12–46)
Lymphs Abs: 1.5 10*3/uL (ref 0.7–4.0)
MCH: 28.3 pg (ref 26.0–34.0)
MCV: 83.7 fL (ref 78.0–100.0)
Monocytes Absolute: 1.2 10*3/uL — ABNORMAL HIGH (ref 0.1–1.0)
Monocytes Relative: 7 % (ref 3–12)
RBC: 4.06 MIL/uL — ABNORMAL LOW (ref 4.22–5.81)
WBC: 16.2 10*3/uL — ABNORMAL HIGH (ref 4.0–10.5)

## 2012-07-02 LAB — BASIC METABOLIC PANEL
CO2: 22 mEq/L (ref 19–32)
Calcium: 8.7 mg/dL (ref 8.4–10.5)
Chloride: 99 mEq/L (ref 96–112)
Glucose, Bld: 135 mg/dL — ABNORMAL HIGH (ref 70–99)
Potassium: 3.5 mEq/L (ref 3.5–5.1)
Sodium: 132 mEq/L — ABNORMAL LOW (ref 135–145)

## 2012-07-02 LAB — CLOSTRIDIUM DIFFICILE BY PCR: Toxigenic C. Difficile by PCR: NEGATIVE

## 2012-07-02 SURGERY — INCISION AND DRAINAGE, ABSCESS
Anesthesia: General

## 2012-07-02 MED ORDER — ASPIRIN 81 MG PO CHEW
81.0000 mg | CHEWABLE_TABLET | Freq: Every day | ORAL | Status: DC
  Administered 2012-07-02 – 2012-07-19 (×17): 81 mg via ORAL
  Filled 2012-07-02 (×18): qty 1

## 2012-07-02 MED ORDER — ENOXAPARIN SODIUM 40 MG/0.4ML ~~LOC~~ SOLN
40.0000 mg | SUBCUTANEOUS | Status: DC
  Administered 2012-07-02 – 2012-07-19 (×18): 40 mg via SUBCUTANEOUS
  Filled 2012-07-02 (×18): qty 0.4

## 2012-07-02 MED ORDER — SODIUM CHLORIDE 0.9 % IJ SOLN: 10.0000 mL | INTRAMUSCULAR | Status: DC | PRN

## 2012-07-02 MED ORDER — KCL IN DEXTROSE-NACL 20-5-0.45 MEQ/L-%-% IV SOLN
INTRAVENOUS | Status: DC
  Administered 2012-07-02: 05:00:00 via INTRAVENOUS
  Filled 2012-07-02 (×3): qty 1000

## 2012-07-02 MED ORDER — CLINIMIX E/DEXTROSE (5/15) 5 % IV SOLN
INTRAVENOUS | Status: AC
  Administered 2012-07-02: 17:00:00 via INTRAVENOUS
  Filled 2012-07-02: qty 1000

## 2012-07-02 MED ORDER — ADULT MULTIVITAMIN W/MINERALS CH
1.0000 | ORAL_TABLET | Freq: Every day | ORAL | Status: DC
  Administered 2012-07-02 – 2012-07-19 (×17): 1 via ORAL
  Filled 2012-07-02 (×18): qty 1

## 2012-07-02 MED ORDER — PIPERACILLIN-TAZOBACTAM 3.375 G IVPB
3.3750 g | Freq: Three times a day (TID) | INTRAVENOUS | Status: DC
  Administered 2012-07-02 – 2012-07-09 (×22): 3.375 g via INTRAVENOUS
  Filled 2012-07-02 (×23): qty 50

## 2012-07-02 MED ORDER — ONDANSETRON HCL 4 MG/2ML IJ SOLN: 4.0000 mg | Freq: Four times a day (QID) | INTRAMUSCULAR | Status: DC | PRN

## 2012-07-02 MED ORDER — IOHEXOL 300 MG/ML  SOLN
100.0000 mL | Freq: Once | INTRAMUSCULAR | Status: AC | PRN
  Administered 2012-07-02: 100 mL via INTRAVENOUS

## 2012-07-02 MED ORDER — MORPHINE SULFATE 2 MG/ML IJ SOLN
1.0000 mg | INTRAMUSCULAR | Status: DC | PRN
  Administered 2012-07-02 – 2012-07-04 (×6): 2 mg via INTRAVENOUS
  Administered 2012-07-05 – 2012-07-07 (×3): 3 mg via INTRAVENOUS
  Administered 2012-07-08 – 2012-07-09 (×6): 2 mg via INTRAVENOUS
  Administered 2012-07-10: 3 mg via INTRAVENOUS
  Administered 2012-07-11 – 2012-07-18 (×4): 2 mg via INTRAVENOUS
  Filled 2012-07-02: qty 2
  Filled 2012-07-02 (×4): qty 1
  Filled 2012-07-02: qty 2
  Filled 2012-07-02: qty 1
  Filled 2012-07-02: qty 2
  Filled 2012-07-02 (×6): qty 1
  Filled 2012-07-02: qty 2
  Filled 2012-07-02 (×5): qty 1

## 2012-07-02 MED ORDER — ZOLPIDEM TARTRATE 5 MG PO TABS
5.0000 mg | ORAL_TABLET | Freq: Once | ORAL | Status: AC
  Administered 2012-07-02: 5 mg via ORAL
  Filled 2012-07-02: qty 1

## 2012-07-02 MED ORDER — SODIUM CHLORIDE 0.9 % IJ SOLN
10.0000 mL | Freq: Two times a day (BID) | INTRAMUSCULAR | Status: DC
  Administered 2012-07-02 – 2012-07-17 (×13): 10 mL
  Administered 2012-07-18 (×2): 20 mL
  Administered 2012-07-19: 10 mL

## 2012-07-02 MED ORDER — KCL IN DEXTROSE-NACL 20-5-0.45 MEQ/L-%-% IV SOLN
INTRAVENOUS | Status: DC
  Administered 2012-07-02 – 2012-07-11 (×12): via INTRAVENOUS
  Administered 2012-07-12: 1000 mL via INTRAVENOUS
  Administered 2012-07-14 – 2012-07-17 (×4): via INTRAVENOUS
  Filled 2012-07-02 (×18): qty 1000

## 2012-07-02 MED ORDER — HYDROCODONE-ACETAMINOPHEN 5-325 MG PO TABS
1.0000 | ORAL_TABLET | Freq: Four times a day (QID) | ORAL | Status: DC | PRN
  Administered 2012-07-02 – 2012-07-18 (×22): 1 via ORAL
  Filled 2012-07-02 (×23): qty 1

## 2012-07-02 MED ORDER — INSULIN ASPART 100 UNIT/ML ~~LOC~~ SOLN
0.0000 [IU] | Freq: Four times a day (QID) | SUBCUTANEOUS | Status: DC
  Administered 2012-07-02 – 2012-07-15 (×21): 1 [IU] via SUBCUTANEOUS

## 2012-07-02 NOTE — Progress Notes (Signed)
INITIAL ADULT NUTRITION ASSESSMENT Date: 07/02/2012   Time: 2:18 PM Reason for Assessment: Consult regarding unintentional weight loss  ASSESSMENT: Male 56 y.o.  Dx: Fever and leukocytosis, unclear etiology; Right peri-rectal abscess on MRI - 06/30/2012   Past Medical History  Diagnosis Date  . GERD (gastroesophageal reflux disease)   . Psoriasis   . Benign neoplasm of colon 05/20/2005    adenomatous polyp  . Diverticulosis of colon (without mention of hemorrhage)   . COPD (chronic obstructive pulmonary disease)   . Inguinal hernia     bi-lateral  . 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 ? Perianal fistula vs. Abscess      Scheduled Meds:    . sodium chloride   Intravenous Once  . aspirin  81 mg Oral Daily  . enoxaparin  40 mg Subcutaneous Q24H  . insulin aspart  0-9 Units Subcutaneous Q6H  . multivitamin with minerals  1 tablet Oral Daily  . piperacillin-tazobactam (ZOSYN)  IV  3.375 g Intravenous Q8H   Continuous Infusions:    . dextrose 5 % and 0.45 % NaCl with KCl 20 mEq/L    . TPN (CLINIMIX) +/- additives    . DISCONTD: dextrose 5 % and 0.45 % NaCl with KCl 20 mEq/L 125 mL/hr at 07/02/12 0520   PRN Meds:.HYDROcodone-acetaminophen, iohexol, morphine injection, ondansetron   Ht: 5\' 4"  (162.6 cm)  Wt: 140 lb 11.2 oz (63.821 kg)  Ideal Wt:  59.2 kg % Ideal Wt: 108%  Usual Wt: 160 lb % Usual Wt: 87.5%  Body mass index is 24.15 kg/(m^2).  Food/Nutrition Related Hx:  Reported decreased appetite x 2 weeks PTA. Pt reports 20 lb weight loss in past 3 months (12.5% weight loss in 3 months classified as severe % weight loss). Pt reported that PTA, he could usually eat pretty well in the morning, but lunch and dinner upset his stomach. Pt reported following a low residue, low fiber diet PTA and also consumed protein shakes when unable to eat solid foods. Pt stated he took walks twice a day and did weight machines (with very low weight) at the gym  to build his strength back up.  PICC line being placed at this time and TPN ordered. Noted goal rate at 75 mL/hr with 20% lipids MWF, which provides 90 gm protein (100% estimated needs) and 1278 kcal (without lipids), 1758 kcal (with lipids), average 1483 kcal daily (~80% estimated needs).     Labs:  CMP     Component Value Date/Time   NA 132* 07/02/2012 0704   K 3.5 07/02/2012 0704   CL 99 07/02/2012 0704   CO2 22 07/02/2012 0704   GLUCOSE 135* 07/02/2012 0704   BUN 9 07/02/2012 0704   CREATININE 0.73 07/02/2012 0704   CREATININE 0.91 01/04/2012 1230   CALCIUM 8.7 07/02/2012 0704   PROT 6.9 07/01/2012 2115   ALBUMIN 2.7* 07/01/2012 2115   AST 78* 07/01/2012 2115   ALT 134* 07/01/2012 2115   ALKPHOS 264* 07/01/2012 2115   BILITOT 0.3 07/01/2012 2115   GFRNONAA >90 07/02/2012 0704   GFRAA >90 07/02/2012 0704     Intake/Output Summary (Last 24 hours) at 07/02/12 1418 Last data filed at 07/02/12 0540  Gross per 24 hour  Intake   1200 ml  Output    200 ml  Net   1000 ml   Diet Order: Clear Liquid   Supplements/Tube Feeding: None at this time.  IVF:     dextrose 5 %  and 0.45 % NaCl with KCl 20 mEq/L   TPN (CLINIMIX) +/- additives   DISCONTD: dextrose 5 % and 0.45 % NaCl with KCl 20 mEq/L Last Rate: 125 mL/hr at 07/02/12 0520    Estimated Nutritional Needs:   Kcal: 1850-2050 Protein: 75-90 gm Fluid: >2.1 L  NUTRITION DIAGNOSIS: -Altered GI function (NI-1.4).  Status: Ongoing  RELATED TO: previous small bowel perforation, expl lap 6/16 with ileocecectomy; abdominal pain with gas collection  AS EVIDENCE BY: CT scan and pt comments  MONITORING/EVALUATION(Goals): Goal: To provide >90% estimated nutritional needs via TPN. Monitor: labs, weight, diet order advancement  EDUCATION NEEDS: -No education needs identified at this time  INTERVENTION: Pharmacy to dose TPN. RD to follow.   DOCUMENTATION CODES Per approved criteria  -Severe malnutrition in the context of acute illness or injury     Leonette Most 07/02/2012, 2:18 PM

## 2012-07-02 NOTE — Progress Notes (Signed)
Peripherally Inserted Central Catheter/Midline Placement  The IV Nurse has discussed with the patient and/or persons authorized to consent for the patient, the purpose of this procedure and the potential benefits and risks involved with this procedure.  The benefits include less needle sticks, lab draws from the catheter and patient may be discharged home with the catheter.  Risks include, but not limited to, infection, bleeding, blood clot (thrombus formation), and puncture of an artery; nerve damage and irregular heat beat.  Alternatives to this procedure were also discussed.  PICC/Midline Placement Documentation        Stacie Glaze Horton 07/02/2012, 3:16 PM

## 2012-07-02 NOTE — ED Notes (Signed)
Called report to floor RN. 

## 2012-07-02 NOTE — Progress Notes (Addendum)
PARENTERAL NUTRITION CONSULT NOTE - INITIAL  Pharmacy Consult for TNA Indication: SB perforation  Allergies  Allergen Reactions  . Wellbutrin (Bupropion Hcl)     Neg CNS. As of 01/04/2012 patient states he is not allergic to  Wellbutrin.    Patient Measurements: Height: 5\' 4"  (162.6 cm) Weight: 140 lb 11.2 oz (63.821 kg) IBW/kg (Calculated) : 59.2  Usual Weight: 64kg  Vital Signs: Temp: 97.8 F (36.6 C) (08/04 0755) Temp src: Oral (08/04 0755) BP: 126/76 mmHg (08/04 0755) Pulse Rate: 60  (08/04 0755) Intake/Output from previous day: 08/03 0701 - 08/04 0700 In: 1200 [P.O.:200; I.V.:1000] Out: 200 [Urine:200] Intake/Output from this shift:    Labs:  [facility] 07/02/12 0704 07/01/12 2115  WBC 16.2* 22.5*  HGB 11.5* 11.5*  HCT 34.0* 33.6*  PLT 385 422*  APTT -- --  INR -- --     [facility] 07/02/12 0704 07/01/12 2115  NA 132* 127*  K 3.5 3.7  CL 99 91*  CO2 22 24  GLUCOSE 135* 99  BUN 9 14  CREATININE 0.73 0.86  LABCREA -- --  CREAT24HRUR -- --  CALCIUM 8.7 8.6  MG -- --  PHOS -- --  PROT -- 6.9  ALBUMIN -- 2.7*  AST -- 78*  ALT -- 134*  ALKPHOS -- 264*  BILITOT -- 0.3  BILIDIR -- --  IBILI -- --  PREALBUMIN -- --  TRIG -- --  CHOLHDL -- --  CHOL -- --   Estimated Creatinine Clearance: 85.3 ml/min (by C-G formula based on Cr of 0.73).   No results found for this basename: GLUCAP:3 in the last 72 hours  Medical History: Past Medical History  Diagnosis Date  . GERD (gastroesophageal reflux disease)   . Psoriasis   . Benign neoplasm of colon 05/20/2005    adenomatous polyp  . Diverticulosis of colon (without mention of hemorrhage)   . COPD (chronic obstructive pulmonary disease)   . Inguinal hernia     bi-lateral  . 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 ? Perianal fistula vs. Abscess      Medications:  Scheduled:    . sodium chloride   Intravenous Once  . aspirin  81 mg Oral Daily  . enoxaparin  40 mg  Subcutaneous Q24H  . multivitamin with minerals  1 tablet Oral Daily  . piperacillin-tazobactam (ZOSYN)  IV  3.375 g Intravenous Q8H   Infusions:    . dextrose 5 % and 0.45 % NaCl with KCl 20 mEq/L 125 mL/hr at 07/02/12 0520  . TPN (CLINIMIX) +/- additives      Insulin Requirements in the past 24 hours:  None ordered yet, no hx DM  Current Nutrition:  To begin TNA with Clinimix E 5/15 at 40 ml/hr  Assessment:  Previous small bowel perforation, expl lap 6/16 with ileocecectomy, abscess drainage. MRI 8/2 showed a peri-rectal abscess-but no physical exam correlation with symptoms of abd pain.  CT scan with abd gas collection, no discreet abscess. Zosyn began empirically for leukocytosis.  Plan NPO, TNA, and will rpt CT scan in 4-7 days.  LFT elevation; use lower concentration dextrose.  Nutritional Goals:   Plan goal rate TNA at 75 ml/hr to deliver 90 gm protein/day 1280 kcal/day (without lipids); 1760 kcal/day (with lipids); and average 1400 kcal/day  Plan:   Begin TNA today at 35ml/hr, using E 5/15 formula.  Lipids on M,W,F only due to national backorder.  Patient ordered po MVI daily, will assess tolerance of po  administration, can add MVI and trace elements to TNA M,W,F only.  TNA labs: full labs [place], [place].  Otho Bellows PharmD Pager [phone] 07/02/2012,12:29 PM

## 2012-07-02 NOTE — Consult Note (Signed)
Referring Provider: Dr. Ezzard Standing Primary Care Physician:  Samuel Edin, MD Primary Gastroenterologist:  Dr.Gessner  Reason for Consultation:    HPI: Samuel Montgomery is a 56 y.o. male well known to me.  He presented with perforated small bowel about 7 weeks ago, appeared localized at first but after about a week of IV abx a repeat CT scan showed free, persistent perf of bowel with abscess formation. He underwent ex lap by Dr. Gerrit Friends which resulted in clean out, small bowel resection, primary anastomosis. He has not been on any specific crohn's meds. Recent perianal fistula type drainage and was evaluated in office, out pt MRI pelvic set up.  OVer the weekend, mild abd pains and fevers to 102.6.  He called me early in the fever and I phoned in cipro/flagyl however fevers persisted, increased and I sent him to ER.  Dr. Ezzard Standing kindly saw him and felt the "perirectal abscess" noted on MR was unlikely causing any problems since he's not been tender rectally at all.  CT scan set up, this showed air containing phlegmon in right lower abdomen.  Corresponds to location of worsening abd pains.  He's been started in OV abx (zosyn).  No bowel changes.     Past Medical History  Diagnosis Date  . GERD (gastroesophageal reflux disease)   . Psoriasis   . Benign neoplasm of colon 05/20/2005    adenomatous polyp  . Diverticulosis of colon (without mention of hemorrhage)   . COPD (chronic obstructive pulmonary disease)   . Inguinal hernia     bi-lateral  . 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 ? Perianal fistula vs. Abscess      Past Surgical History  Procedure Date  . Inguinal hernia repair     bilateral   . Appendectomy 1979  . Vasectomy   . Colonoscopy w/ biopsies   . Laparotomy 05/14/2012    Procedure: EXPLORATORY LAPAROTOMY;  Surgeon: Velora Heckler, MD;  Location: [facility];  Service: General;  Laterality: N/A;  . Bowel resection 05/14/2012    Procedure: SMALL BOWEL  RESECTION;  Surgeon: Velora Heckler, MD;  Location: [facility];  Service: General;  Laterality: N/A;  ileocecectomy    Prior to Admission medications   Medication Sig Start Date End Date Taking? Authorizing Provider  aspirin 81 MG chewable tablet Chew 81 mg by mouth daily.   Yes Historical Provider, MD  ciprofloxacin (CIPRO) 500 MG tablet Take 1 tablet (500 mg total) by mouth 2 (two) times daily. 06/30/12 07/10/12 Yes Rachael Fee, MD  HYDROcodone-acetaminophen (NORCO/VICODIN) 5-325 MG per tablet Take 1 tablet by mouth every 6 (six) hours as needed. For pain. 06/15/12  Yes Historical Provider, MD  metroNIDAZOLE (FLAGYL) 250 MG tablet Take 1 tablet (250 mg total) by mouth 3 (three) times daily. 06/30/12 07/07/12 Yes Rachael Fee, MD  Multiple Vitamin (MULITIVITAMIN WITH MINERALS) TABS Take 1 tablet by mouth daily.   Yes Historical Provider, MD    Current Facility-Administered Medications  Medication Dose Route Frequency Provider Last Rate Last Dose  . 0.9 %  sodium chloride infusion   Intravenous Once Toy Baker, MD 125 mL/hr at 07/01/12 2119    . aspirin chewable tablet 81 mg  81 mg Oral Daily Kandis Cocking, MD      . dextrose 5 % and 0.45 % NaCl with KCl 20 mEq/L infusion   Intravenous Continuous Kandis Cocking, MD 125 mL/hr at 07/02/12 0520    .  enoxaparin (LOVENOX) injection 40 mg  40 mg Subcutaneous Q24H Kandis Cocking, MD      . HYDROcodone-acetaminophen (NORCO/VICODIN) 5-325 MG per tablet 1 tablet  1 tablet Oral Q6H PRN Kandis Cocking, MD      . iohexol (OMNIPAQUE) 300 MG/ML solution 100 mL  100 mL Intravenous Once PRN Medication Radiologist, MD   100 mL at 07/02/12 0915  . morphine 2 MG/ML injection 1-3 mg  1-3 mg Intravenous Q2H PRN Kandis Cocking, MD      . multivitamin with minerals tablet 1 tablet  1 tablet Oral Daily Kandis Cocking, MD      . ondansetron [facility]) injection 4 mg  4 mg Intravenous Q6H PRN Kandis Cocking, MD      . piperacillin-tazobactam (ZOSYN) IVPB 3.375 g  3.375 g  Intravenous Q8H Samuel Montgomery, PHARMD   3.375 g at 07/02/12 1610    Allergies as of 07/01/2012 - Review Complete 07/01/2012  Allergen Reaction Noted  . Wellbutrin (bupropion hcl)  12/10/2011    Family History  Problem Relation Age of Onset  . Colon polyps Sister     x 2  . Crohn's disease Sister   . Stroke Father   . Colon cancer Neg Hx   . Hyperlipidemia Mother   . GER disease Mother     History   Social History  . Marital Status: Legally Separated    Spouse Name: N/A    Number of Children: 2  . Years of Education: N/A   Occupational History  . [employer]   .     Social History Main Topics  . Smoking status: Smoker, Current Status Unknown -- 1.0 packs/day for 40 years    Types: Cigarettes  . Smokeless tobacco: Never Used   Comment: has not had any cigarrettes since before surgery  . Alcohol Use: 5.0 oz/week    10 drink(s) per week     4-5 drinks daily./hasn't drink since surgery  . Drug Use: Yes    Special: Marijuana     per patient uses pot monthly.  . Sexually Active: No   Other Topics Concern  . Not on file   Social History Narrative  . No narrative on file    Review of Systems: Pertinent positive and negative review of systems were noted in the above HPI section.  All other review of systems was otherwise negative. Physical Exam: Vital signs in last 24 hours: Temp:  [97.7 F (36.5 C)-99.4 F (37.4 C)] 98.1 F (36.7 C) (08/04 0539) Pulse Rate:  [66-89] 83  (08/04 0539) Resp:  [18-22] 18  (08/04 0539) BP: (107-115)/(51-67) 115/67 mmHg (08/04 0539) SpO2:  [96 %-98 %] 98 % (08/04 0539) Weight:  [140 lb (63.504 kg)-140 lb 11.2 oz (63.821 kg)] 140 lb 11.2 oz (63.821 kg) (08/04 0307)   General:   Alert,  Well-developed, well-nourished, pleasant and cooperative in NAD Head:  Normocephalic and atraumatic. Eyes:  Sclera clear, no icterus.   Conjunctiva pink. Ears:  Normal auditory acuity. Nose:  No deformity, discharge,  or lesions. Mouth:  No  deformity or lesions.   Neck:  Supple; no masses or thyromegaly. Lungs:  Clear throughout to auscultation.   No wheezes, crackles, or rhonchi.  Heart:  Regular rate and rhythm; no murmurs, clicks, rubs,  or gallops. Abdomen:  Soft, very tender RLQ/RMQ with guarding, no true rebound, BS+, no palp mass or HSM Rectal - not repeated- done per Dr. Ezzard Standing Msk:  Symmetrical without gross deformities. Marland Kitchen  Pulses:  Normal pulses noted. Extremities:  Without clubbing or edema. Neurologic:  Alert and  oriented x4;  grossly normal neurologically. Skin:  Intact without significant lesions or rashes.. Psych:  Alert and cooperative. Normal mood and affect.  Lab Results:  [facility] 07/02/12 0704 07/01/12 2115  WBC 16.2* 22.5*  HGB 11.5* 11.5*  HCT 34.0* 33.6*  PLT 385 422*   BMET  Basename 07/02/12 0704 07/01/12 2115  NA 132* 127*  K 3.5 3.7  CL 99 91*  CO2 22 24  GLUCOSE 135* 99  BUN 9 14  CREATININE 0.73 0.86  CALCIUM 8.7 8.6   LFT  Basename 07/01/12 2115  PROT 6.9  ALBUMIN 2.7*  AST 78*  ALT 134*  ALKPHOS 264*  BILITOT 0.3  BILIDIR --  IBILI --    Studies/Results: Dg Chest 2 View  07/01/2012  *RADIOLOGY REPORT*  Clinical Data: Bowel resection 2 days ago with difficulty breathing and fever since.  CHEST - 2 VIEW  Comparison: Chest 01/04/2012.  CT chest 04/11/2012.  Findings: Normal heart size and pulmonary vascularity.  No focal airspace consolidation in the lungs.  Mild hyperinflation with scattered fibrosis suggesting emphysematous change.  Calcified granuloma in the left mid lung with calcified left hilar lymph nodes.  No focal consolidation.  No pneumothorax.  No significant changes since the previous study.  IMPRESSION: Emphysematous changes and scattered fibrosis in the lungs.  No evidence of active pulmonary disease.  Original Report Authenticated By: Marlon Pel, M.D.   Mr Pelvis W Wo Contrast  07/01/2012  *RADIOLOGY REPORT*  Clinical Data: Crohn's disease, status post  surgery, rectal pain, evaluate for rectal abscess/fistula  MRI PELVIS WITHOUT AND WITH CONTRAST  Technique:  Multiplanar multisequence MR imaging of the pelvis was performed both before and after administration of intravenous contrast.  Contrast: 13mL MULTIHANCE GADOBENATE DIMEGLUMINE 529 MG/ML IV SOLN  Comparison: [facility] CT abdomen/pelvis dated 05/13/2012  Findings: 2.6 x 0.8 cm perirectal abscess in the right gluteal cleft (series 6/image 27).  This communicates with a fistulous tract (series 8/image 20) which likely arises from the rectum at the 3 o'clock position (series 8/image 21).  An additional blind-ending tract extends inferiorly along the left gluteal cleft (series 8/image 24).  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  Prostate and bladder are unremarkable.  No focal osseous lesions.  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  Associated 2.6 x 0.8 cm perirectal abscess in the right gluteal cleft.  Original Report Authenticated By: Charline Bills, M.D.   Ct Abdomen Pelvis W Contrast  07/02/2012  *RADIOLOGY REPORT*  Clinical Data: Leukocytosis.  Recent ileocecal resection  CT ABDOMEN AND PELVIS WITH CONTRAST  Technique:  Multidetector CT imaging of the abdomen and pelvis was performed following the standard protocol during bolus administration of intravenous contrast.  Contrast: OMNIPAQUE IOHEXOL 300 MG/ML  SOLN  Comparison: 05/13/2012  Findings: There is plate-like atelectasis noted in both lung bases. There is no pericardial or pleural effusion.  The liver appears enlarged and there is mild diffuse fatty infiltration the liver. Slightly nodular contour to the liver parenchyma is noted.  No focal liver lesions identified.  The gallbladder appears within normal limits.  No biliary dilatation.  The pancreas is normal. Normal appearance of the spleen measuring 10 cm in craniocaudal  dimension.  The adrenal glands are both within normal limits.  Normal appearance of both kidneys.  The urinary bladder appears  normal.  Prominent periaortic lymph node is identified measuring 1.1 cm, image 38. Similar to prior exam.    There are multiple enlarged ileocolic lymph nodes within the right lower quadrant of the abdomen which measure up to 1.5 cm.  The stomach appears normal.  Proximal small bowel loops are within normal limits.  Postoperative changes within the right lower quadrant of the abdomen identified compatible with prior ileocecectomy with enterocolonic anastomoses.  There is a moderate to marked inflammatory change within the right lower quadrant of the abdomen including fat stranding and phlegmon formation.  Wall thickening involving the distal small bowel loops noted.  Linear, interloop gas collection is noted within the right lower quadrant measuring 4.3 cm in length.  This separates loops of distal small bowel and may represent evidence of penetrating disease. No drainable fluid collections identified at this time.  There is no specific features to suggest bowel obstruction.  The mid and distal colon appears normal.  There is no evidence for colonic obstruction.  Moderate ascites is identified within the abdomen.  No significant pelvic ascites.  Review of the visualized osseous structures is significant for scoliosis and mild multilevel degenerative disc disease.  No pelvic or inguinal adenopathy.  IMPRESSION:  1.  Examination is positive for acute inflammation within the right lower quadrant of the abdomen.  There is abnormal wall thickening of the distal small bowel loops and fat stranding as well as evidence of reactive adenopathy. 2.  Gas containing structure within the right lower quadrant separate small bowel loops and may represent penetrating disease. 3. At this time, there is no evidence for bowel obstruction or drainable fluid collection/abscess. 4.  Morphologic features of the  liver suggestive of cirrhosis.  Original Report Authenticated By: Rosealee Albee, M.D.    IMPRESSION:  Fevers, WBC, air containing phlegmon in right abdomen about 6 weeks after ex lap for crohn's related small bowel perforation  PLAN: I disucssed difficult case with Dr. Ezzard Standing.  The air/phlegom is surrounded by bowel, pretty deep.  Probably not drainable.  Surgery would be quite difficult and likely require diverting ileostomy.  For now, NPO and continue IV fluids, IV abx.  Planning to repeat CT in 3-4 days, pending clinical course.  HE is not tender at rectum and so I don't think the fluid collection noted on MRI is clinically important at this point.  Todays CT abd/pelvis also shows no clear perirectal inflammation, abscess.  We will follow along.   Amy Esterwood  07/02/2012, 10:00 AM

## 2012-07-02 NOTE — Progress Notes (Signed)
ANTIBIOTIC CONSULT NOTE - INITIAL  Pharmacy Consult for Zosyn Indication: Presumed abdominal infection  Allergies  Allergen Reactions  . Wellbutrin (Bupropion Hcl)     Neg CNS. As of 01/04/2012 patient states he is not allergic to  Wellbutrin.    Patient Measurements: Height: 5\' 4"  (162.6 cm) Weight: 140 lb 11.2 oz (63.821 kg) IBW/kg (Calculated) : 59.2     Vital Signs: Temp: 98.1 F (36.7 C) (08/04 0307) Temp src: Oral (08/04 0307) BP: 109/65 mmHg (08/04 0307) Pulse Rate: 67  (08/04 0307) Intake/Output from previous day: 08/03 0701 - 08/04 0700 In: 1000 [I.V.:1000] Out: 200 [Urine:200] Intake/Output from this shift: Total I/O In: 1000 [I.V.:1000] Out: 200 [Urine:200]  Labs:  [facility] 07/01/12 2115  WBC 22.5*  HGB 11.5*  PLT 422*  LABCREA --  CREATININE 0.86   Estimated Creatinine Clearance: 79.4 ml/min (by C-G formula based on Cr of 0.86). No results found for this basename: VANCOTROUGH:2,VANCOPEAK:2,VANCORANDOM:2,GENTTROUGH:2,GENTPEAK:2,GENTRANDOM:2,TOBRATROUGH:2,TOBRAPEAK:2,TOBRARND:2,AMIKACINPEAK:2,AMIKACINTROU:2,AMIKACIN:2, in the last 72 hours   Microbiology: No results found for this or any previous visit (from the past 720 hour(s)).  Medical History: Past Medical History  Diagnosis Date  . GERD (gastroesophageal reflux disease)   . Psoriasis   . Benign neoplasm of colon 05/20/2005    adenomatous polyp  . Diverticulosis of colon (without mention of hemorrhage)   . COPD (chronic obstructive pulmonary disease)   . Inguinal hernia     bi-lateral  . 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 ? Perianal fistula vs. Abscess      Medications:  Scheduled:    . sodium chloride   Intravenous Once  . aspirin  81 mg Oral Daily  . enoxaparin  40 mg Subcutaneous Q24H  . multivitamin with minerals  1 tablet Oral Daily   Infusions:    . dextrose 5 % and 0.45 % NaCl with KCl 20 mEq/L     Assessment:  56 year old male with h/o  Crohn's diseasewith peri-rectal abscess seen on MRI 8/2  Presents to ER with complaint of fever and pain  IV Zosyn to begin empirically for suspected abdominal infection  Goal of Therapy:  Eradication of suspected infection  Plan:   Zosyn 3.375gm IV q8h (extended infusion)  Follow clinical course/culture data  Karrisa Didio, Joselyn Glassman, PharmD 07/02/2012,4:07 AM

## 2012-07-02 NOTE — Progress Notes (Addendum)
General Surgery Note  LOS: 1 day  GI - Gessner  Assessment/Plan: 1.  Fever and leukocytosis, unclear etiology  For abd/pelvic CT scan today  WBC better today  Empirically on Zosyn  He is more tender in his right mid abdomen today.  [CT scan shows interloop gas collection (no real abscess) in the RLQ.  There is no contrast in the collection.  Discussed with Dr. Christella Hartigan.  Plan:  NPO, TPN, IV antibiotics and repeat CT scan in 4-7 days.  Discussed with patient.  DN 07/02/2012]  2.  Right peri-rectal abscess on MRI - 06/30/2012  No correlation of PE  3. EXPLORATORY LAPAROTOMY, ileocectomy, drainage of abscess - T. Gerkin - 05/14/2012  4. Crohn's disease.  Followed by Dr. Leone Payor. I spoke to Dr. Christella Hartigan on the phone.  5. COPD   6. Smokes -   He says that he has not smoked since the surgery.  7. Bilateral inguinal hernias. R>L. 8. GERD  9.  DVT proph - on Lovenox  Subjective:  Feels better this AM, no diaphoresis last PM. Objective:   Filed Vitals:   07/02/12 0539  BP: 115/67  Pulse: 83  Temp: 98.1 F (36.7 C)  Resp: 18     Intake/Output from previous day:  08/03 0701 - 08/04 0700 In: 1200 [P.O.:200; I.V.:1000] Out: 200 [Urine:200]  Intake/Output this shift:      Physical Exam:   General: Talkative WM who is alert and oriented.    HEENT: Normal. Pupils equal. .   Lungs: Clear   Abdomen: Sore right mid abdomen.  This is more localized that I appreciated last PM   Rectum - still no redness or localized tenderness   Neurologic:  Grossly intact to motor and sensory function.   Psychiatric: Has normal mood and affect. Behavior is normal   Lab Results:    Basename 07/02/12 0704 07/01/12 2115  WBC 16.2* 22.5*  HGB 11.5* 11.5*  HCT 34.0* 33.6*  PLT 385 422*    BMET   Basename 07/02/12 0704 07/01/12 2115  NA 132* 127*  K 3.5 3.7  CL 99 91*  CO2 22 24  GLUCOSE 135* 99  BUN 9 14  CREATININE 0.73 0.86  CALCIUM 8.7 8.6    PT/INR  No results found for this basename:  LABPROT:2,INR:2 in the last 72 hours  ABG  No results found for this basename: PHART:2,PCO2:2,PO2:2,HCO3:2 in the last 72 hours   Studies/Results:  Dg Chest 2 View  07/01/2012  *RADIOLOGY REPORT*  Clinical Data: Bowel resection 2 days ago with difficulty breathing and fever since.  CHEST - 2 VIEW  Comparison: Chest 01/04/2012.  CT chest 04/11/2012.  Findings: Normal heart size and pulmonary vascularity.  No focal airspace consolidation in the lungs.  Mild hyperinflation with scattered fibrosis suggesting emphysematous change.  Calcified granuloma in the left mid lung with calcified left hilar lymph nodes.  No focal consolidation.  No pneumothorax.  No significant changes since the previous study.  IMPRESSION: Emphysematous changes and scattered fibrosis in the lungs.  No evidence of active pulmonary disease.  Original Report Authenticated By: Marlon Pel, M.D.   Mr Pelvis W Wo Contrast  07/01/2012  *RADIOLOGY REPORT*  Clinical Data: Crohn's disease, status post surgery, rectal pain, evaluate for rectal abscess/fistula  MRI PELVIS WITHOUT AND WITH CONTRAST  Technique:  Multiplanar multisequence MR imaging of the pelvis was performed both before and after administration of intravenous contrast.  Contrast: 13mL MULTIHANCE GADOBENATE DIMEGLUMINE 529 MG/ML IV SOLN  Comparison:  [facility] CT abdomen/pelvis dated 05/13/2012  Findings: 2.6 x 0.8 cm perirectal abscess in the right gluteal cleft (series 6/image 27).  This communicates with a fistulous tract (series 8/image 20) which likely arises from the rectum at the 3 o'clock position (series 8/image 21).  An additional blind-ending tract extends inferiorly along the left gluteal cleft (series 8/image 24).  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  Prostate and bladder are unremarkable.  No focal osseous lesions.  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  Associated 2.6 x 0.8 cm perirectal abscess in the right gluteal cleft.  Original Report Authenticated By: Charline Bills, M.D.     Anti-infectives:   Anti-infectives     Start     Dose/Rate Route Frequency Ordered Stop   07/02/12 0500  piperacillin-tazobactam (ZOSYN) IVPB 3.375 g       3.375 g 12.5 mL/hr over 240 Minutes Intravenous Every 8 hours 07/02/12 0412            Ovidio Kin, MD, FACS Pager: [phone],   [facility] Office: [phone] 07/02/2012

## 2012-07-03 DIAGNOSIS — Z86010 Personal history of colonic polyps: Secondary | ICD-10-CM

## 2012-07-03 DIAGNOSIS — K5080 Crohn's disease of both small and large intestine without complications: Secondary | ICD-10-CM

## 2012-07-03 DIAGNOSIS — K603 Anal fistula: Secondary | ICD-10-CM

## 2012-07-03 LAB — GLUCOSE, CAPILLARY
Glucose-Capillary: 117 mg/dL — ABNORMAL HIGH (ref 70–99)
Glucose-Capillary: 134 mg/dL — ABNORMAL HIGH (ref 70–99)

## 2012-07-03 LAB — COMPREHENSIVE METABOLIC PANEL
Albumin: 2.4 g/dL — ABNORMAL LOW (ref 3.5–5.2)
BUN: 6 mg/dL (ref 6–23)
Calcium: 8.6 mg/dL (ref 8.4–10.5)
Chloride: 92 mEq/L — ABNORMAL LOW (ref 96–112)
Creatinine, Ser: 0.76 mg/dL (ref 0.50–1.35)
GFR calc non Af Amer: >90 mL/min (ref >90)
Total Bilirubin: 0.2 mg/dL — ABNORMAL LOW (ref 0.3–1.2)

## 2012-07-03 LAB — PREALBUMIN: Prealbumin: 8.9 mg/dL — ABNORMAL LOW (ref 17.0–34.0)

## 2012-07-03 LAB — MAGNESIUM: Magnesium: 1.9 mg/dL (ref 1.5–2.5)

## 2012-07-03 LAB — PHOSPHORUS: Phosphorus: 3.6 mg/dL (ref 2.3–4.6)

## 2012-07-03 LAB — CHOLESTEROL, TOTAL: Cholesterol: 91 mg/dL (ref 0–200)

## 2012-07-03 LAB — URINE CULTURE
Colony Count: NO GROWTH
Culture: NO GROWTH

## 2012-07-03 LAB — CBC
Platelets: 335 10*3/uL (ref 150–400)
RBC: 3.86 MIL/uL — ABNORMAL LOW (ref 4.22–5.81)
RDW: 13.5 % (ref 11.5–15.5)
WBC: 14.7 10*3/uL — ABNORMAL HIGH (ref 4.0–10.5)

## 2012-07-03 LAB — TRIGLYCERIDES: Triglycerides: 105 mg/dL (ref <150)

## 2012-07-03 LAB — DIFFERENTIAL
Basophils Absolute: 0 10*3/uL (ref 0.0–0.1)
Lymphocytes Relative: 10 % — ABNORMAL LOW (ref 12–46)
Neutro Abs: 11.6 10*3/uL — ABNORMAL HIGH (ref 1.7–7.7)

## 2012-07-03 MED ORDER — FAT EMULSION 20 % IV EMUL
250.0000 mL | INTRAVENOUS | Status: AC
  Administered 2012-07-03: 250 mL via INTRAVENOUS
  Filled 2012-07-03: qty 250

## 2012-07-03 MED ORDER — ZOLPIDEM TARTRATE 10 MG PO TABS
10.0000 mg | ORAL_TABLET | Freq: Every evening | ORAL | Status: DC | PRN
  Administered 2012-07-03 – 2012-07-18 (×16): 10 mg via ORAL
  Filled 2012-07-03 (×17): qty 1

## 2012-07-03 MED ORDER — ZINC TRACE METAL 1 MG/ML IV SOLN
INTRAVENOUS | Status: AC
  Administered 2012-07-03: 18:00:00 via INTRAVENOUS
  Filled 2012-07-03: qty 2000

## 2012-07-03 NOTE — Progress Notes (Signed)
Patient ID: NICHAEL EHLY, male   DOB: 05-15-1956, 56 y.o.   MRN: [id number] [facility] Progress Note  Subjective: T max 101 this am. He feels about the same-uncomfortable but no severe pain. Intenittent sweats. TPN started, npo  Objective:  Vital signs in last 24 hours: Temp:  [99.2 F (37.3 C)-102.9 F (39.4 C)] 101 F (38.3 C) (08/05 0544) Pulse Rate:  [73-87] 84  (08/05 0544) Resp:  [16-18] 18  (08/05 0544) BP: (104-131)/(61-71) 130/69 mmHg (08/05 0544) SpO2:  [94 %-99 %] 94 % (08/05 0544) Last BM Date: 07/03/12 General:   Alert,  Well-developed,    in NAD Heart:  Regular rate and rhythm; no murmurs Pulm;clear Abdomen:  Soft,marked tenderness RMQ and RLQ with guarding.Bs+   Extremities:  Without edema. Neurologic:  Alert and  oriented x4;  grossly normal neurologically. Psych:  Alert and cooperative. Normal mood and affect.  Intake/Output from previous day: 08/04 0701 - 08/05 0700 In: 1085.3 [I.V.:1085.3] Out: -  Intake/Output this shift:    Lab Results:  Basename 07/03/12 0545 07/02/12 0704 07/01/12 2115  WBC 14.7* 16.2* 22.5*  HGB 10.9* 11.5* 11.5*  HCT 31.9* 34.0* 33.6*  PLT 335 385 422*   BMET  Basename 07/03/12 0545 07/02/12 0704 07/01/12 2115  NA 127* 132* 127*  K 3.7 3.5 3.7  CL 92* 99 91*  CO2 26 22 24   GLUCOSE 119* 135* 99  BUN 6 9 14   CREATININE 0.76 0.73 0.86  CALCIUM 8.6 8.7 8.6   LFT  Basename 07/03/12 0545  PROT 6.4  ALBUMIN 2.4*  AST 35  ALT 80*  ALKPHOS 181*  BILITOT 0.2*  BILIDIR --  IBILI --     Assessment / Plan: #1 56 yo male   With presumed anastomotic leak/contained- with abscess/phlegmon . He is 6 weeks post op. On Zosyn-day #2 TPN started, and NPO Plan is for repeat CT in several days  Management per surgery We will follow peripherally. Active Problems:  Nonspecific (abnormal) findings on radiological and other examination of gastrointestinal tract     LOS: 2 days   Valor Quaintance  07/03/2012, 9:38  AM

## 2012-07-03 NOTE — Progress Notes (Addendum)
Subjective: Patient states he is feeling a little better. No nausea or vomiting. Passing lots of flatus. Small amounts of liquid stool. Pain is not that bad, according to see him.  He complains of intermittent fever and diaphoresis but no chills. Max. Temp = 101.  CT scan reviewed. Interloop gas collection without drainable abscess noted in right lower quadrant. No obstruction noted. Presumed contained anastomotic leak. Patient aware this would be a difficult time to reexplore, 6 weeks postop.  TNA started.  Objective: Vital signs in last 24 hours: Temp:  [99.2 F (37.3 C)-102.9 F (39.4 C)] 101 F (38.3 C) (08/05 0544) Pulse Rate:  [73-87] 84  (08/05 0544) Resp:  [16-18] 18  (08/05 0544) BP: (104-131)/(61-71) 130/69 mmHg (08/05 0544) SpO2:  [94 %-99 %] 94 % (08/05 0544) Last BM Date: 07/03/12  Intake/Output from previous day: 08/04 0701 - 08/05 0700 In: 1085.3 [I.V.:1085.3] Out: -  Intake/Output this shift:    General appearance: alert. Cooperative. Mental status normal. In no obvious distress. Skin warm and dry. GI: abdomen soft, tender with guarding right lower quadrant. Seems localized.  Lab Results:   Basename 07/03/12 0545 07/02/12 0704  WBC 14.7* 16.2*  HGB 10.9* 11.5*  HCT 31.9* 34.0*  PLT 335 385   BMET  Basename 07/03/12 0545 07/02/12 0704  NA 127* 132*  K 3.7 3.5  CL 92* 99  CO2 26 22  GLUCOSE 119* 135*  BUN 6 9  CREATININE 0.76 0.73  CALCIUM 8.6 8.7   PT/INR No results found for this basename: LABPROT:2,INR:2 in the last 72 hours ABG No results found for this basename: PHART:2,PCO2:2,PO2:2,HCO3:2 in the last 72 hours  Studies/Results: Dg Chest 2 View  07/01/2012  *RADIOLOGY REPORT*  Clinical Data: Bowel resection 2 days ago with difficulty breathing and fever since.  CHEST - 2 VIEW  Comparison: Chest 01/04/2012.  CT chest 04/11/2012.  Findings: Normal heart size and pulmonary vascularity.  No focal airspace consolidation in the lungs.  Mild  hyperinflation with scattered fibrosis suggesting emphysematous change.  Calcified granuloma in the left mid lung with calcified left hilar lymph nodes.  No focal consolidation.  No pneumothorax.  No significant changes since the previous study.  IMPRESSION: Emphysematous changes and scattered fibrosis in the lungs.  No evidence of active pulmonary disease.  Original Report Authenticated By: Marlon Pel, M.D.   Ct Abdomen Pelvis W Contrast  07/02/2012  *RADIOLOGY REPORT*  Clinical Data: Leukocytosis.  Recent ileocecal resection  CT ABDOMEN AND PELVIS WITH CONTRAST  Technique:  Multidetector CT imaging of the abdomen and pelvis was performed following the standard protocol during bolus administration of intravenous contrast.  Contrast: OMNIPAQUE IOHEXOL 300 MG/ML  SOLN  Comparison: 05/13/2012  Findings: There is plate-like atelectasis noted in both lung bases. There is no pericardial or pleural effusion.  The liver appears enlarged and there is mild diffuse fatty infiltration the liver. Slightly nodular contour to the liver parenchyma is noted.  No focal liver lesions identified.  The gallbladder appears within normal limits.  No biliary dilatation.  The pancreas is normal. Normal appearance of the spleen measuring 10 cm in craniocaudal dimension.  The adrenal glands are both within normal limits.  Normal appearance of both kidneys.  The urinary bladder appears normal.  Prominent periaortic lymph node is identified measuring 1.1 cm, image 38. Similar to prior exam.    There are multiple enlarged ileocolic lymph nodes within the right lower quadrant of the abdomen which measure up to 1.5 cm.  The stomach appears normal.  Proximal small bowel loops are within normal limits.  Postoperative changes within the right lower quadrant of the abdomen identified compatible with prior ileocecectomy with enterocolonic anastomoses.  There is a moderate to marked inflammatory change within the right lower quadrant of  the abdomen including fat stranding and phlegmon formation.  Wall thickening involving the distal small bowel loops noted.  Linear, interloop gas collection is noted within the right lower quadrant measuring 4.3 cm in length.  This separates loops of distal small bowel and may represent evidence of penetrating disease. No drainable fluid collections identified at this time.  There is no specific features to suggest bowel obstruction.  The mid and distal colon appears normal.  There is no evidence for colonic obstruction.  Moderate ascites is identified within the abdomen.  No significant pelvic ascites.  Review of the visualized osseous structures is significant for scoliosis and mild multilevel degenerative disc disease.  No pelvic or inguinal adenopathy.  IMPRESSION:  1.  Examination is positive for acute inflammation within the right lower quadrant of the abdomen.  There is abnormal wall thickening of the distal small bowel loops and fat stranding as well as evidence of reactive adenopathy. 2.  Gas containing structure within the right lower quadrant separate small bowel loops and may represent penetrating disease. 3. At this time, there is no evidence for bowel obstruction or drainable fluid collection/abscess. 4.  Morphologic features of the liver suggestive of cirrhosis.  Original Report Authenticated By: Rosealee Albee, M.D.    Anti-infectives: Anti-infectives     Start     Dose/Rate Route Frequency Ordered Stop   07/02/12 0500  piperacillin-tazobactam (ZOSYN) IVPB 3.375 g       3.375 g 12.5 mL/hr over 240 Minutes Intravenous Every 8 hours 07/02/12 0412            Assessment/Plan:  Right lower quadrant abdominal inflammatory process with fever and leukocytosis. This is most likely a contained anastomotic leak without drainable abscess or obstruction.  Leukocytosis improved. For now, we'll treat with bowel rest, TPN, IV antibiotics(Zosyn), and repeat CT scan in 3-6 days. Patient aware of  plan  Perirectal abscess on MRI, no correlation of physical exam  History urgent laparotomy, ileocecectomy and drainage of abscess for Crohn's disease, Dr. Gerrit Friends, 05/14/2012  COPD  Bilateral inguinal hernias, right greater than left  GERD.  DVT prophylaxis. On Lovenox.    LOS: 2 days    Charmagne Buhl M. Derrell Lolling, M.D., [facility] General and Minimally invasive Surgery Breast and Colorectal Surgery Office:   [phone] Pager:   [phone]  07/03/2012

## 2012-07-03 NOTE — Progress Notes (Signed)
UR done. 

## 2012-07-03 NOTE — Progress Notes (Signed)
I have reviewed the above note, examined the patient and agree with plan of treatment.  

## 2012-07-03 NOTE — Progress Notes (Signed)
PARENTERAL NUTRITION CONSULT NOTE - FOLLOW UP  Pharmacy Consult for TNA Indication: SB perforation  Allergies  Allergen Reactions  . Wellbutrin (Bupropion Hcl)     Neg CNS. As of 01/04/2012 patient states he is not allergic to  Wellbutrin.    Patient Measurements: Height: 5\' 4"  (162.6 cm) Weight: 140 lb 11.2 oz (63.821 kg) IBW/kg (Calculated) : 59.2  Usual Weight: 64 kg  Vital Signs: Temp: 101 F (38.3 C) (08/05 0544) Temp src: Oral (08/05 0544) BP: 130/69 mmHg (08/05 0544) Pulse Rate: 84  (08/05 0544) Intake/Output from previous day: 08/04 0701 - 08/05 0700 In: 1085.3 [I.V.:1085.3] Out: -  Intake/Output from this shift:    Labs:  [facility] 07/03/12 0545 07/02/12 0704 07/01/12 2115  WBC 14.7* 16.2* 22.5*  HGB 10.9* 11.5* 11.5*  HCT 31.9* 34.0* 33.6*  PLT 335 385 422*  APTT -- -- --  INR -- -- --     Basename 07/03/12 0545 07/02/12 0704 07/01/12 2115  NA 127* 132* 127*  K 3.7 3.5 3.7  CL 92* 99 91*  CO2 26 22 24   GLUCOSE 119* 135* 99  BUN 6 9 14   CREATININE 0.76 0.73 0.86  LABCREA -- -- --  CREAT24HRUR -- -- --  CALCIUM 8.6 8.7 8.6  MG 1.9 -- --  PHOS 3.6 -- --  PROT 6.4 -- 6.9  ALBUMIN 2.4* -- 2.7*  AST 35 -- 78*  ALT 80* -- 134*  ALKPHOS 181* -- 264*  BILITOT 0.2* -- 0.3  BILIDIR -- -- --  IBILI -- -- --  PREALBUMIN -- -- --  TRIG 105 -- --  CHOLHDL -- -- --  CHOL 91 -- --   Estimated Creatinine Clearance: 85.3 ml/min (by C-G formula based on Cr of 0.76).    Basename 07/03/12 0540 07/02/12 2305 07/02/12 1731  GLUCAP 115* 130* 133*    Medications:  Scheduled:    . aspirin  81 mg Oral Daily  . enoxaparin  40 mg Subcutaneous Q24H  . insulin aspart  0-9 Units Subcutaneous Q6H  . multivitamin with minerals  1 tablet Oral Daily  . piperacillin-tazobactam (ZOSYN)  IV  3.375 g Intravenous Q8H  . sodium chloride  10-40 mL Intracatheter Q12H  . zolpidem  5 mg Oral Once   Infusions:    . dextrose 5 % and 0.45 % NaCl with KCl 20 mEq/L 80  mL/hr at 07/03/12 0747  . TPN (CLINIMIX) +/- additives 40 mL/hr at 07/02/12 1701  . DISCONTD: dextrose 5 % and 0.45 % NaCl with KCl 20 mEq/L 125 mL/hr at 07/02/12 0520    Insulin Requirements in the past 24 hours:  2 units, no Hx of DM (CBGs 133, 130, 115)  Current Nutrition:  TNA E 5/15 @ 40 ml/hr MFluids: D51/2NS @ 80 ml/hr  Assessment: Previous small bowel perforation, expl lap 6/16 with ileocecectomy, abscess drainage. MRI 8/2 showed a peri-rectal abscess-but no physical exam correlation with symptoms of abd pain.  CT scan with abd gas collection, no discreet abscess. Zosyn began empirically for leukocytosis (currently febrile).  Plan cont NPO, TNA, and will rpt CT scan in 4-7 days.  LFT previously elevated now trending down and WNL; will change dex conc.  Nutritional Goals:  1850-2050 kCal, 75-90 grams of protein per day Current goal rate TNA at 75 ml/hr   Plan:  - With LFTs improved with change TNA to E 5/20 and will increase rate to 60 ml/hr with new goal rate of 80 ml/hr.  - Will decrease  IV maintenance fluids by 20 ml/hr to maintain current total fluid rate. - Add lipids @ 15ml/hr today (Lipids only on M/W/F due to shortage) - Check BMET, Mg, and phos on 8/6 - Pt on PO MVI daily will not add IV MVI, but will add trace to IV bag (M/W/F only) - TNA labs: Monday and Thursday  Janace Litten, PharmD Pager: [phone] 07/03/2012,10:40 AM

## 2012-07-04 LAB — BASIC METABOLIC PANEL
Calcium: 8.9 mg/dL (ref 8.4–10.5)
GFR calc non Af Amer: >90 mL/min (ref >90)
Glucose, Bld: 128 mg/dL — ABNORMAL HIGH (ref 70–99)
Sodium: 133 mEq/L — ABNORMAL LOW (ref 135–145)

## 2012-07-04 LAB — GLUCOSE, CAPILLARY: Glucose-Capillary: 107 mg/dL — ABNORMAL HIGH (ref 70–99)

## 2012-07-04 LAB — PHOSPHORUS: Phosphorus: 4.9 mg/dL — ABNORMAL HIGH (ref 2.3–4.6)

## 2012-07-04 LAB — MAGNESIUM: Magnesium: 2.2 mg/dL (ref 1.5–2.5)

## 2012-07-04 MED ORDER — ACETAMINOPHEN 325 MG PO TABS
650.0000 mg | ORAL_TABLET | ORAL | Status: DC | PRN
  Administered 2012-07-05: 325 mg via ORAL
  Administered 2012-07-12: 650 mg via ORAL
  Filled 2012-07-04: qty 1
  Filled 2012-07-04: qty 2

## 2012-07-04 MED ORDER — CLINIMIX E/DEXTROSE (5/20) 5 % IV SOLN
INTRAVENOUS | Status: AC
  Administered 2012-07-04: 18:00:00 via INTRAVENOUS
  Filled 2012-07-04: qty 2000

## 2012-07-04 NOTE — Progress Notes (Signed)
General surgery attending note:  Patient is stable. Slept well last night with Ambien. Has no pain at rest. Still has tenderness when examined. Passing lots of flatus but minimal stool. No nausea.  Abdomen still tender with guarding right lower quadrant.  Agree with evaluation and treatment plan outlined by Mr. Marlyne Beards. Continue bowel rest, TNA, antibiotics, CT scan end of week.   Angelia Mould. Derrell Lolling, M.D., [facility] General and Minimally invasive Surgery Breast and Colorectal Surgery Office:   [phone] Pager:   [phone]

## 2012-07-04 NOTE — Progress Notes (Addendum)
PARENTERAL NUTRITION CONSULT NOTE - FOLLOW UP  Pharmacy Consult for TNA Indication: SB perforation  Allergies  Allergen Reactions  . Wellbutrin (Bupropion Hcl)     Neg CNS. As of 01/04/2012 patient states he is not allergic to  Wellbutrin.    Patient Measurements: Height: 5\' 4"  (162.6 cm) Weight: 140 lb 11.2 oz (63.821 kg) IBW/kg (Calculated) : 59.2  Usual Weight: 64 kg  Vital Signs: Temp: 98.4 F (36.9 C) (08/06 0547) Temp src: Oral (08/06 0547) BP: 125/72 mmHg (08/06 0547) Pulse Rate: 60  (08/06 0547) Intake/Output from previous day: 08/05 0701 - 08/06 0700 In: 2582.3 [I.V.:1404; TPN:1178.3] Out: 700 [Urine:700] Intake/Output from this shift:    Labs:  [facility] 07/03/12 0545 07/02/12 0704 07/01/12 2115  WBC 14.7* 16.2* 22.5*  HGB 10.9* 11.5* 11.5*  HCT 31.9* 34.0* 33.6*  PLT 335 385 422*  APTT -- -- --  INR -- -- --     Basename 07/04/12 0540 07/03/12 0545 07/02/12 0704 07/01/12 2115  NA 133* 127* 132* --  K 3.9 3.7 3.5 --  CL 99 92* 99 --  CO2 26 26 22  --  GLUCOSE 128* 119* 135* --  BUN 9 6 9  --  CREATININE 0.79 0.76 0.73 --  LABCREA -- -- -- --  CREAT24HRUR -- -- -- --  CALCIUM 8.9 8.6 8.7 --  MG 2.2 1.9 -- --  PHOS 4.9* 3.6 -- --  PROT -- 6.4 -- 6.9  ALBUMIN -- 2.4* -- 2.7*  AST -- 35 -- 78*  ALT -- 80* -- 134*  ALKPHOS -- 181* -- 264*  BILITOT -- 0.2* -- 0.3  BILIDIR -- -- -- --  IBILI -- -- -- --  PREALBUMIN -- 8.9* -- --  TRIG -- 105 -- --  CHOLHDL -- -- -- --  CHOL -- 91 -- --   Estimated Creatinine Clearance: 85.3 ml/min (by C-G formula based on Cr of 0.79).    Basename 07/04/12 0545 07/03/12 2307 07/03/12 1808  GLUCAP 126* 134* 132*    Medications:  Scheduled:     . aspirin  81 mg Oral Daily  . enoxaparin  40 mg Subcutaneous Q24H  . insulin aspart  0-9 Units Subcutaneous Q6H  . multivitamin with minerals  1 tablet Oral Daily  . piperacillin-tazobactam (ZOSYN)  IV  3.375 g Intravenous Q8H  . sodium chloride  10-40 mL  Intracatheter Q12H   Infusions:     . dextrose 5 % and 0.45 % NaCl with KCl 20 mEq/L 60 mL/hr at 07/03/12 2110  . fat emulsion 250 mL (07/03/12 1733)  . TPN (CLINIMIX) +/- additives 40 mL/hr at 07/02/12 1701  . TPN (CLINIMIX) +/- additives 60 mL/hr at 07/03/12 1732    Insulin Requirements in the past 24 hours:  3 units, no Hx of DM (CBGs 117, 132, 134, 126)  Current Nutrition:  TNA E 5/20 @ 40 ml/hr MFluids: D51/2NS @ 60 ml/hr  Assessment: Previous small bowel perforation, expl lap 6/16 with ileocecectomy, abscess drainage. MRI 8/2 showed a peri-rectal abscess-but no physical exam correlation with symptoms of abd pain.  CT scan with abd gas collection, no discreet abscess. Zosyn began empirically for leukocytosis (currently febrile).  Plan cont NPO, TNA, and will rpt CT scan in 4-7 days.  LFTs previously elevated now trending down and WNL Phos slightly high at 4.9. Mg WNL. K+ 3.9, Ca WNL.  Nutritional Goals:  1850-2050 kCal, 75-90 grams of protein per day Current goal rate TNA at 80 ml/hr   Plan:  Will continue E5/20 at current rate of 60 ml/hr, will advance as tolerated  Continue MIVF at 60 ml/hr  No lipids today, Lipids MWF only d/t shortage  Phos high today. Will recheck BMET, phos, mg tomorrow. If still high will change to electrolyte free formula.  Pt on PO MVI daily will not add IV MVI, but will add trace to IV bag on M/W/F only d/t shortage  TNA labs: Monday and Thursday  Captola Teschner, Lorra Hals, PharmD Pager: [phone] 07/04/2012,10:46 AM

## 2012-07-04 NOTE — Progress Notes (Signed)
Subjective: Still has some discomfort right side, he can't really  Pinpoint it.  Stool was loose  Objective: Vital signs in last 24 hours: Temp:  [98.2 F (36.8 C)-98.7 F (37.1 C)] 98.4 F (36.9 C) (08/06 0547) Pulse Rate:  [60-84] 60  (08/06 0547) Resp:  [18] 18  (08/06 0547) BP: (96-125)/(52-73) 125/72 mmHg (08/06 0547) SpO2:  [94 %-100 %] 97 % (08/06 0547) Last BM Date: 07/03/12  700 ml urine, + BM x 1,NPO, temp up to 101 yest AM, afebrile since. VSS, BMP, OK no cbc today.  Intake/Output from previous day: 08/05 0701 - 08/06 0700 In: 2582.3 [I.V.:1404; TPN:1178.3] Out: 700 [Urine:700] Intake/Output this shift:    General appearance: alert, cooperative and no distress Resp: clear to auscultation bilaterally GI: soft, non-tender; bowel sounds normal; no masses,  no organomegaly and he has some discomfort Right side, but he can't really localize it right now.  Lab Results:   Basename 07/03/12 0545 07/02/12 0704  WBC 14.7* 16.2*  HGB 10.9* 11.5*  HCT 31.9* 34.0*  PLT 335 385    BMET  Basename 07/04/12 0540 07/03/12 0545  NA 133* 127*  K 3.9 3.7  CL 99 92*  CO2 26 26  GLUCOSE 128* 119*  BUN 9 6  CREATININE 0.79 0.76  CALCIUM 8.9 8.6   PT/INR No results found for this basename: LABPROT:2,INR:2 in the last 72 hours   Lab 07/03/12 0545 07/01/12 2115  AST 35 78*  ALT 80* 134*  ALKPHOS 181* 264*  BILITOT 0.2* 0.3  PROT 6.4 6.9  ALBUMIN 2.4* 2.7*     Lipase     Component Value Date/Time   LIPASE 43 07/01/2012 2115     Studies/Results: Ct Abdomen Pelvis W Contrast  07/02/2012  *RADIOLOGY REPORT*  Clinical Data: Leukocytosis.  Recent ileocecal resection  CT ABDOMEN AND PELVIS WITH CONTRAST  Technique:  Multidetector CT imaging of the abdomen and pelvis was performed following the standard protocol during bolus administration of intravenous contrast.  Contrast: OMNIPAQUE IOHEXOL 300 MG/ML  SOLN  Comparison: 05/13/2012  Findings: There is plate-like  atelectasis noted in both lung bases. There is no pericardial or pleural effusion.  The liver appears enlarged and there is mild diffuse fatty infiltration the liver. Slightly nodular contour to the liver parenchyma is noted.  No focal liver lesions identified.  The gallbladder appears within normal limits.  No biliary dilatation.  The pancreas is normal. Normal appearance of the spleen measuring 10 cm in craniocaudal dimension.  The adrenal glands are both within normal limits.  Normal appearance of both kidneys.  The urinary bladder appears normal.  Prominent periaortic lymph node is identified measuring 1.1 cm, image 38. Similar to prior exam.    There are multiple enlarged ileocolic lymph nodes within the right lower quadrant of the abdomen which measure up to 1.5 cm.  The stomach appears normal.  Proximal small bowel loops are within normal limits.  Postoperative changes within the right lower quadrant of the abdomen identified compatible with prior ileocecectomy with enterocolonic anastomoses.  There is a moderate to marked inflammatory change within the right lower quadrant of the abdomen including fat stranding and phlegmon formation.  Wall thickening involving the distal small bowel loops noted.  Linear, interloop gas collection is noted within the right lower quadrant measuring 4.3 cm in length.  This separates loops of distal small bowel and may represent evidence of penetrating disease. No drainable fluid collections identified at this time.  There is no  specific features to suggest bowel obstruction.  The mid and distal colon appears normal.  There is no evidence for colonic obstruction.  Moderate ascites is identified within the abdomen.  No significant pelvic ascites.  Review of the visualized osseous structures is significant for scoliosis and mild multilevel degenerative disc disease.  No pelvic or inguinal adenopathy.  IMPRESSION:  1.  Examination is positive for acute inflammation within the right  lower quadrant of the abdomen.  There is abnormal wall thickening of the distal small bowel loops and fat stranding as well as evidence of reactive adenopathy. 2.  Gas containing structure within the right lower quadrant separate small bowel loops and may represent penetrating disease. 3. At this time, there is no evidence for bowel obstruction or drainable fluid collection/abscess. 4.  Morphologic features of the liver suggestive of cirrhosis.  Original Report Authenticated By: Rosealee Albee, M.D.    Medications:    . aspirin  81 mg Oral Daily  . enoxaparin  40 mg Subcutaneous Q24H  . insulin aspart  0-9 Units Subcutaneous Q6H  . multivitamin with minerals  1 tablet Oral Daily  . piperacillin-tazobactam (ZOSYN)  IV  3.375 g Intravenous Q8H  . sodium chloride  10-40 mL Intracatheter Q12H    Assessment/Plan Right lower quadrant abdominal inflammatory process with fever and leukocytosis, Possible anastomotic leak, not a drainable abscess currently. S/P Ileocecectomy , drainage of abscess, for Crohn's dz 05/14/12 Dr. Gerrit Friends COPD Bilateral inguinal hernias R>L GERD Lovenox/DVT prophylaxis   Plan:  Continue bowel rest/TNA/Antibiotics  CT later this week.  LOS: 3 days    Deaken Jurgens 07/04/2012

## 2012-07-04 NOTE — Progress Notes (Signed)
Fever 102 last night, overall feeling slightly better. RLQ still very tender. WBC trending down. On c TNA, will follow peripherally ., at some point he will be a candidate for biologicals but not in the setting of active infection.

## 2012-07-05 ENCOUNTER — Telehealth (INDEPENDENT_AMBULATORY_CARE_PROVIDER_SITE_OTHER): Payer: Self-pay

## 2012-07-05 LAB — GLUCOSE, CAPILLARY
Glucose-Capillary: 132 mg/dL — ABNORMAL HIGH (ref 70–99)
Glucose-Capillary: 117 mg/dL — ABNORMAL HIGH (ref 70–99)

## 2012-07-05 LAB — PHOSPHORUS: Phosphorus: 4.4 mg/dL (ref 2.3–4.6)

## 2012-07-05 LAB — BASIC METABOLIC PANEL
BUN: 10 mg/dL (ref 6–23)
CO2: 26 mEq/L (ref 19–32)
Chloride: 96 mEq/L (ref 96–112)
GFR calc non Af Amer: >90 mL/min (ref >90)
Glucose, Bld: 104 mg/dL — ABNORMAL HIGH (ref 70–99)
Potassium: 4 mEq/L (ref 3.5–5.1)
Sodium: 130 mEq/L — ABNORMAL LOW (ref 135–145)

## 2012-07-05 LAB — CBC
HCT: 33.3 % — ABNORMAL LOW (ref 39.0–52.0)
Hemoglobin: 11.6 g/dL — ABNORMAL LOW (ref 13.0–17.0)
MCHC: 34.8 g/dL (ref 30.0–36.0)
RBC: 4.04 MIL/uL — ABNORMAL LOW (ref 4.22–5.81)

## 2012-07-05 MED ORDER — ZINC TRACE METAL 1 MG/ML IV SOLN
INTRAVENOUS | Status: AC
  Administered 2012-07-05: 17:00:00 via INTRAVENOUS
  Filled 2012-07-05: qty 2000

## 2012-07-05 MED ORDER — FAT EMULSION 20 % IV EMUL
240.0000 mL | INTRAVENOUS | Status: AC
  Administered 2012-07-05: 240 mL via INTRAVENOUS
  Filled 2012-07-05: qty 250

## 2012-07-05 NOTE — Progress Notes (Signed)
General surgery attending note:  Patient interviewed and examined. Agree with the assessment and treatment plan as outlined by Mr. Marlyne Beards.  He is generally stable, alert, anxious about what is going to happen to him.  Abdomen is soft, but he is still tender in the right lower quadrant. There is some guarding but no mass that I can detect.   Assessment: Right lower quadrant abdominal inflammatory process with fever and leukocytosis. This is most likely a contained anastomotic leak without drainable abscess or obstruction. Leukocytosis improved.   For now, we'll continue treat with bowel rest, TPN, IV antibiotics(Zosyn), and repeat CT scan on Friday. Patient aware of plan   Perirectal abscess on MRI, no correlation of physical exam   History urgent laparotomy, ileocecectomy and drainage of abscess for Crohn's disease, Dr. Gerrit Friends, 05/14/2012   COPD   Bilateral inguinal hernias, right greater than left   GERD.   DVT prophylaxis. On Lovenox.   Plan: CT scan Friday.  The patient has stated that if the CT scan shows no improvement and if we were going to recommend surgery that he wants a second opinion, perhaps at a university. I told him that we would honor his left request.   Angelia Mould. Derrell Lolling, M.D., [facility] General and Minimally invasive Surgery Breast and Colorectal Surgery Office:   [phone] Pager:   [phone]

## 2012-07-05 NOTE — Progress Notes (Signed)
Nutrition Follow Up  Intervention: TNA per pharmacy. Diet advancement per MD. Will monitor.   Diet: NPO  TNA: Clinimix E 5/20 @ 60 ml/hr.  Lipids (20% IVFE @ 10 ml/hr), multivitamins, and trace elements are provided 3 times weekly (MWF) due to national backorder.  Provides 1473 kcal and 72 grams protein daily (based on weekly average).  Meets 79% minimum estimated kcal and 96% minimum estimated protein needs.  Additional IVF with D5 1/2 NS @ 60 ml/hr.  - Pt scheduled to advance to goal rate of Clinimix 5/20 at 80 ml/hr with lipids (20% IVFE @ 34ml/hr) at next bag - this will provide 1896 calories and 96g protein daily (based on weekly average) and will meet 102% minimum estimated kcal and 128% minimum estimated protein needs.   - Sodium remains low - Potassium, magnesium, and phosphorus WNL - CBGs well controlled - Albumin relatively stable and not likely to improve soon r/t RLQ inflammatory process - PALB low at 8.9 mg/dL on 8/5  Nutrition dx: Altered GI function - ongoing  Goal: To provide >90% estimated nutritional needs via TPN - not met but will be met with next bag  Awaiting repeat CT scan at the end of the week. Met with pt who denies any pain, nausea, or cramps. Pt reports he has been having small "squirts" of stool, last one documented yesterday. Pt states he is hungry and wishes he was eating.   Dietitian# [phone]

## 2012-07-05 NOTE — Telephone Encounter (Signed)
Pt calling in b/c he is wanting his disability rtw date extended from 07/10/12 to undertermined now b/c the pt has been readmitted to [facility] as of 07/01/12. The pt has developed an abscess that is being treated with IV antibiotics for now. The pt has no idea when he will go home from the hospital or if he will have to have surgery. The pt just needs his disability forms resent to his job. The pt asked if we would print out the disability form under media for the date of 06/07/12 and just correct that form with the new info. B/c he has no way of getting new forms to you while he is in the hospital. The pt does want this done ASAP so he can make his house payment.

## 2012-07-05 NOTE — Progress Notes (Signed)
ANTIBIOTIC CONSULT NOTE - INITIAL  Pharmacy Consult for Zosyn Indication: Presumed abdominal infection  Allergies  Allergen Reactions  . Wellbutrin (Bupropion Hcl)     Neg CNS. As of 01/04/2012 patient states he is not allergic to  Wellbutrin.    Patient Measurements: Height: 5\' 4"  (162.6 cm) Weight: 140 lb 11.2 oz (63.821 kg) IBW/kg (Calculated) : 59.2     Vital Signs: Temp: 98.8 F (37.1 C) (08/07 0610) Temp src: Oral (08/07 0610) BP: 107/59 mmHg (08/07 0610) Pulse Rate: 60  (08/07 0610) Intake/Output from previous day: 08/06 0701 - 08/07 0700 In: 3317.5 [I.V.:1528; IV Piggyback:111; TPN:1678.5] Out: 375 [Urine:375] Intake/Output from this shift:    Labs:  Basename 07/05/12 0600 07/04/12 0540 07/03/12 0545  WBC 13.6* -- 14.7*  HGB 11.6* -- 10.9*  PLT 325 -- 335  LABCREA -- -- --  CREATININE 0.78 0.79 0.76   Estimated Creatinine Clearance: 85.3 ml/min (by C-G formula based on Cr of 0.78). No results found for this basename: VANCOTROUGH:2,VANCOPEAK:2,VANCORANDOM:2,GENTTROUGH:2,GENTPEAK:2,GENTRANDOM:2,TOBRATROUGH:2,TOBRAPEAK:2,TOBRARND:2,AMIKACINPEAK:2,AMIKACINTROU:2,AMIKACIN:2, in the last 72 hours   Microbiology: Recent Results (from the past 720 hour(s))  URINE CULTURE     Status: Normal   Collection Time   07/01/12  9:40 PM      Component Value Range Status Comment   Specimen Description URINE, CLEAN CATCH   Final    Special Requests NONE   Final    Culture  Setup Time 07/02/2012 02:31   Final    Colony Count NO GROWTH   Final    Culture NO GROWTH   Final    Report Status 07/03/2012 FINAL   Final   CLOSTRIDIUM DIFFICILE BY PCR     Status: Normal   Collection Time   07/02/12  7:09 AM      Component Value Range Status Comment   C difficile by pcr NEGATIVE  NEGATIVE Final     Medical History: Past Medical History  Diagnosis Date  . GERD (gastroesophageal reflux disease)   . Psoriasis   . Benign neoplasm of colon 05/20/2005    adenomatous polyp  .  Diverticulosis of colon (without mention of hemorrhage)   . COPD (chronic obstructive pulmonary disease)   . Inguinal hernia     bi-lateral  . 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 ? Perianal fistula vs. Abscess      Medications:  Scheduled:     . aspirin  81 mg Oral Daily  . enoxaparin  40 mg Subcutaneous Q24H  . insulin aspart  0-9 Units Subcutaneous Q6H  . multivitamin with minerals  1 tablet Oral Daily  . piperacillin-tazobactam (ZOSYN)  IV  3.375 g Intravenous Q8H  . sodium chloride  10-40 mL Intracatheter Q12H   Infusions:     . dextrose 5 % and 0.45 % NaCl with KCl 20 mEq/L 60 mL/hr at 07/05/12 0614  . fat emulsion    . fat emulsion 10 kcal (07/04/12 0700)  . TPN (CLINIMIX) +/- additives 60 mL/hr at 07/03/12 1732  . TPN (CLINIMIX) +/- additives 60 mL/hr at 07/04/12 1754  . TPN (CLINIMIX) +/- additives     Assessment:  56 year old male with h/o Crohn's diseasewith peri-rectal abscess seen on MRI 8/2 on day # 4 Zosyn for suspected abdominal infection  Renal function remains stable, WBC still slightly elevated but trended down, AF  Urine culture negative, C.Diff negative, no other culture data   Goal of Therapy:  Eradication of suspected infection  Plan:   Continue Zosyn 3.375gm  IV q8h (extended infusion)  Follow LOT  Mahkai Fangman, Loma Messing PharmD 9:56 AM 07/05/2012

## 2012-07-05 NOTE — Progress Notes (Signed)
PARENTERAL NUTRITION CONSULT NOTE - FOLLOW UP  Pharmacy Consult for TNA Indication: SB perforation  Allergies  Allergen Reactions  . Wellbutrin (Bupropion Hcl)     Neg CNS. As of 01/04/2012 patient states he is not allergic to  Wellbutrin.    Patient Measurements: Height: 5\' 4"  (162.6 cm) Weight: 140 lb 11.2 oz (63.821 kg) IBW/kg (Calculated) : 59.2  Usual Weight: 64 kg  Vital Signs: Temp: 98.8 F (37.1 C) (08/07 0610) Temp src: Oral (08/07 0610) BP: 107/59 mmHg (08/07 0610) Pulse Rate: 60  (08/07 0610) Intake/Output from previous day: 08/06 0701 - 08/07 0700 In: 3317.5 [I.V.:1528; IV Piggyback:111; TPN:1678.5] Out: 375 [Urine:375] Intake/Output from this shift:    Labs:  Basename 07/05/12 0600 07/03/12 0545  WBC 13.6* 14.7*  HGB 11.6* 10.9*  HCT 33.3* 31.9*  PLT 325 335  APTT -- --  INR -- --     Basename 07/05/12 0600 07/04/12 0540 07/03/12 0545  NA 130* 133* 127*  K 4.0 3.9 3.7  CL 96 99 92*  CO2 26 26 26   GLUCOSE 104* 128* 119*  BUN 10 9 6   CREATININE 0.78 0.79 0.76  LABCREA -- -- --  CREAT24HRUR -- -- --  CALCIUM 9.1 8.9 8.6  MG 2.2 2.2 1.9  PHOS 4.4 4.9* 3.6  PROT -- -- 6.4  ALBUMIN -- -- 2.4*  AST -- -- 35  ALT -- -- 80*  ALKPHOS -- -- 181*  BILITOT -- -- 0.2*  BILIDIR -- -- --  IBILI -- -- --  PREALBUMIN -- -- 8.9*  TRIG -- -- 105  CHOLHDL -- -- --  CHOL -- -- 91   Estimated Creatinine Clearance: 85.3 ml/min (by C-G formula based on Cr of 0.78).    Basename 07/05/12 0720 07/04/12 1829 07/04/12 1241  GLUCAP 132* 107* 119*    Medications:  Scheduled:     . aspirin  81 mg Oral Daily  . enoxaparin  40 mg Subcutaneous Q24H  . insulin aspart  0-9 Units Subcutaneous Q6H  . multivitamin with minerals  1 tablet Oral Daily  . piperacillin-tazobactam (ZOSYN)  IV  3.375 g Intravenous Q8H  . sodium chloride  10-40 mL Intracatheter Q12H   Infusions:     . dextrose 5 % and 0.45 % NaCl with KCl 20 mEq/L 60 mL/hr at 07/05/12 0614  . fat  emulsion 10 kcal (07/04/12 0700)  . TPN (CLINIMIX) +/- additives 60 mL/hr at 07/03/12 1732  . TPN (CLINIMIX) +/- additives 60 mL/hr at 07/04/12 1754    Insulin Requirements in the past 24 hours:  2 units SSI; CBG's 107-132 No Hx DM  Current Nutrition:  TNA E 5/20 @ 40 ml/hr MIVF: D51/2NS @ 60 ml/hr  Assessment: Previous small bowel perforation, expl lap 6/16 with ileocecectomy, abscess drainage. MRI 8/2 showed a peri-rectal abscess  CT scan with abd gas collection, no discreet abscess. Zosyn began empirically for leukocytosis Plan cont NPO, TNA, and will rpt CT scan in 4-7 days.  Electrolytes  Na remains low, cannot be adjusted in TNA  Potassium WNL  Phosphorus now WNL today (4.4), down from 4.9 yesterday -- Will f/u with  Phos in labs in AM  Magnesium WNL  LFTs previously elevated now trending down (8/6)  Nutritional Goals:  1850-2050 kCal, 75-90 grams of protein per day Current goal rate TNA at 80 ml/hr   Plan:   Advance Clinimix E5/20 to goal rate 80 ml/hr  Decrease MIVF to 40 ml/hr  Lipids MWF only d/t shortage  Pt on PO MVI daily will not add IV MVI, but will add trace to IV bag on M/W/F only d/t shortage  TNA labs: Monday and Thursday   Clydene Fake PharmD 9:48 AM 07/05/2012

## 2012-07-05 NOTE — Progress Notes (Signed)
  Subjective: Only complaint is his wrist and knees hurt, some worry that something different post op could have prevented this. Medically stable no pain.  Objective: Vital signs in last 24 hours: Temp:  [98.4 F (36.9 C)-99.4 F (37.4 C)] 98.8 F (37.1 C) (08/07 0610) Pulse Rate:  [60-73] 60  (08/07 0610) Resp:  [18] 18  (08/07 0610) BP: (107-122)/(59-69) 107/59 mmHg (08/07 0610) SpO2:  [98 %] 98 % (08/07 0610) Last BM Date: 07/04/12  +BM, NPO, TNA for nutrition, afebrile, VSS, WBC is still up,  Intake/Output from previous day: 08/06 0701 - 08/07 0700 In: 3317.5 [I.V.:1528; IV Piggyback:111; TPN:1678.5] Out: 375 [Urine:375] Intake/Output this shift:    General appearance: alert, cooperative and no distress Resp: clear to auscultation bilaterally GI: soft, non-tender; bowel sounds normal; no masses,  no organomegaly and incision well healed.  Lab Results:   Basename 07/05/12 0600 07/03/12 0545  WBC 13.6* 14.7*  HGB 11.6* 10.9*  HCT 33.3* 31.9*  PLT 325 335    BMET  Basename 07/05/12 0600 07/04/12 0540  NA 130* 133*  K 4.0 3.9  CL 96 99  CO2 26 26  GLUCOSE 104* 128*  BUN 10 9  CREATININE 0.78 0.79  CALCIUM 9.1 8.9   PT/INR No results found for this basename: LABPROT:2,INR:2 in the last 72 hours   Lab 07/03/12 0545 07/01/12 2115  AST 35 78*  ALT 80* 134*  ALKPHOS 181* 264*  BILITOT 0.2* 0.3  PROT 6.4 6.9  ALBUMIN 2.4* 2.7*     Lipase     Component Value Date/Time   LIPASE 43 07/01/2012 2115     Studies/Results: No results found.  Medications:    . aspirin  81 mg Oral Daily  . enoxaparin  40 mg Subcutaneous Q24H  . insulin aspart  0-9 Units Subcutaneous Q6H  . multivitamin with minerals  1 tablet Oral Daily  . piperacillin-tazobactam (ZOSYN)  IV  3.375 g Intravenous Q8H  . sodium chloride  10-40 mL Intracatheter Q12H    Assessment/Plan Right lower quadrant abdominal inflammatory process with fever and leukocytosis, Possible anastomotic  leak, not a drainable abscess currently.  S/P Ileocecectomy , drainage of abscess, for Crohn's dz 05/14/12 Dr. Gerrit Friends  COPD  Bilateral inguinal hernias R>L  GERD  Lovenox/DVT prophylaxis   Plan: check uric acid, continue antibiotics, repeat CT possibly on Friday. Watch WBC    LOS: 4 days    Samuel Montgomery 07/05/2012

## 2012-07-06 ENCOUNTER — Telehealth (INDEPENDENT_AMBULATORY_CARE_PROVIDER_SITE_OTHER): Payer: Self-pay

## 2012-07-06 ENCOUNTER — Other Ambulatory Visit (HOSPITAL_COMMUNITY): Payer: BC Managed Care – PPO

## 2012-07-06 LAB — COMPREHENSIVE METABOLIC PANEL
ALT: 117 U/L — ABNORMAL HIGH (ref 0–53)
Alkaline Phosphatase: 257 U/L — ABNORMAL HIGH (ref 39–117)
BUN: 14 mg/dL (ref 6–23)
CO2: 25 mEq/L (ref 19–32)
Chloride: 97 mEq/L (ref 96–112)
GFR calc Af Amer: >90 mL/min (ref >90)
Glucose, Bld: 110 mg/dL — ABNORMAL HIGH (ref 70–99)
Potassium: 4.2 mEq/L (ref 3.5–5.1)
Sodium: 132 mEq/L — ABNORMAL LOW (ref 135–145)
Total Bilirubin: 0.3 mg/dL (ref 0.3–1.2)
Total Protein: 7 g/dL (ref 6.0–8.3)

## 2012-07-06 LAB — MAGNESIUM: Magnesium: 2.2 mg/dL (ref 1.5–2.5)

## 2012-07-06 LAB — GLUCOSE, CAPILLARY
Glucose-Capillary: 121 mg/dL — ABNORMAL HIGH (ref 70–99)
Glucose-Capillary: 122 mg/dL — ABNORMAL HIGH (ref 70–99)
Glucose-Capillary: 128 mg/dL — ABNORMAL HIGH (ref 70–99)
Glucose-Capillary: 138 mg/dL — ABNORMAL HIGH (ref 70–99)

## 2012-07-06 LAB — CBC
MCH: 28 pg (ref 26.0–34.0)
MCHC: 33.5 g/dL (ref 30.0–36.0)
MCV: 83.7 fL (ref 78.0–100.0)
Platelets: 355 10*3/uL (ref 150–400)
RBC: 4.1 MIL/uL — ABNORMAL LOW (ref 4.22–5.81)

## 2012-07-06 MED ORDER — CLINIMIX E/DEXTROSE (5/20) 5 % IV SOLN
INTRAVENOUS | Status: AC
  Administered 2012-07-06: 18:00:00 via INTRAVENOUS
  Filled 2012-07-06: qty 2000

## 2012-07-06 NOTE — Progress Notes (Signed)
PARENTERAL NUTRITION CONSULT NOTE - FOLLOW UP  Pharmacy Consult for TNA Indication: SB perforation  Allergies  Allergen Reactions  . Wellbutrin (Bupropion Hcl)     Neg CNS. As of 01/04/2012 patient states he is not allergic to  Wellbutrin.    Patient Measurements: Height: 5\' 4"  (162.6 cm) Weight: 140 lb 11.2 oz (63.821 kg) IBW/kg (Calculated) : 59.2  Usual Weight: 64 kg  Vital Signs: Temp: 98.8 F (37.1 C) (08/08 0426) Temp src: Oral (08/08 0426) BP: 113/69 mmHg (08/08 0426) Pulse Rate: 64  (08/08 0426) Intake/Output from previous day: 08/07 0701 - 08/08 0700 In: 3748.7 [I.V.:1359.7; IV Piggyback:240; TPN:2149] Out: 800 [Urine:800] Intake/Output from this shift:    Labs:  Basename 07/06/12 0530 07/05/12 0600  WBC 12.5* 13.6*  HGB 11.5* 11.6*  HCT 34.3* 33.3*  PLT 355 325  APTT -- --  INR -- --     Basename 07/06/12 0530 07/05/12 0600 07/04/12 0540  NA 132* 130* 133*  K 4.2 4.0 3.9  CL 97 96 99  CO2 25 26 26   GLUCOSE 110* 104* 128*  BUN 14 10 9   CREATININE 0.87 0.78 0.79  LABCREA -- -- --  CREAT24HRUR -- -- --  CALCIUM 9.0 9.1 8.9  MG 2.2 2.2 2.2  PHOS 4.1 4.4 4.9*  PROT 7.0 -- --  ALBUMIN 2.6* -- --  AST 60* -- --  ALT 117* -- --  ALKPHOS 257* -- --  BILITOT 0.3 -- --  BILIDIR -- -- --  IBILI -- -- --  PREALBUMIN -- -- --  TRIG -- -- --  CHOLHDL -- -- --  CHOL -- -- --   Estimated Creatinine Clearance: 78.4 ml/min (by C-G formula based on Cr of 0.87).    Basename 07/06/12 0610 07/05/12 2322 07/05/12 1804  GLUCAP 131* 121* 112*    Medications:  Scheduled:     . aspirin  81 mg Oral Daily  . enoxaparin  40 mg Subcutaneous Q24H  . insulin aspart  0-9 Units Subcutaneous Q6H  . multivitamin with minerals  1 tablet Oral Daily  . piperacillin-tazobactam (ZOSYN)  IV  3.375 g Intravenous Q8H  . sodium chloride  10-40 mL Intracatheter Q12H   Infusions:     . dextrose 5 % and 0.45 % NaCl with KCl 20 mEq/L 40 mL/hr at 07/06/12 0320  . fat  emulsion 240 mL (07/05/12 1723)  . TPN (CLINIMIX) +/- additives 60 mL/hr at 07/04/12 1754  . TPN (CLINIMIX) +/- additives 80 mL/hr at 07/05/12 1724    Insulin Requirements in the past 24 hours:  3 units SSI; CBG's 104-110 No Hx DM  Current Nutrition:  TNA E 5/20 @ 80 ml/hr MIVF: D51/2NS @ 40 ml/hr  Assessment: Previous small bowel perforation, expl lap 6/16 with ileocecectomy, abscess drainage. MRI 8/2 showed a peri-rectal abscess  CT scan with abd gas collection, no discreet abscess. Zosyn began empirically for leukocytosis Plan cont NPO, TNA, and will rpt CT scan in 4-7 days.  Electrolytes  Na remains low, cannot be adjusted in TNA  Potassium WNL  Phosphorus now WNL today (4.1), Magnesium WNL LFTs slightly elevated but stable, will continue to monitor  Nutritional Goals:  1850-2050 kCal, 75-90 grams of protein per day Current goal rate TNA at 80 ml/hr   Plan:   Continue Clinimix E5/20 at goal rate 80 ml/hr  Continue MIVF to 40 ml/hr  Lipids MWF only d/t shortage  Pt on PO MVI daily will not add IV MVI, but will add  trace to IV bag on M/W/F only d/t shortage  TNA labs: Monday and Thursday   Janace Litten PharmD [phone] 8:18 AM 07/06/2012

## 2012-07-06 NOTE — Progress Notes (Signed)
  Subjective: Tells me he doesn't hurt and can move into fetal position now without pain.  Objective: Vital signs in last 24 hours: Temp:  [97.4 F (36.3 C)-98.8 F (37.1 C)] 98.8 F (37.1 C) (08/08 0426) Pulse Rate:  [57-64] 64  (08/08 0426) Resp:  [19-20] 19  (08/08 0426) BP: (91-113)/(53-69) 113/69 mmHg (08/08 0426) SpO2:  [97 %-100 %] 97 % (08/08 0426) Last BM Date: 07/04/12  Afebrile, VSS, 99.4=Tm, WBC minimally better.  Intake/Output from previous day: 08/07 0701 - 08/08 0700 In: 3748.7 [I.V.:1359.7; IV Piggyback:240; TPN:2149] Out: 800 [Urine:800] Intake/Output this shift:    General appearance: alert, cooperative and no distress Resp: clear to auscultation bilaterally GI: soft, non-tender; bowel sounds normal; no masses,  no organomegaly  Lab Results:   Basename 07/06/12 0530 07/05/12 0600  WBC 12.5* 13.6*  HGB 11.5* 11.6*  HCT 34.3* 33.3*  PLT 355 325    BMET  Basename 07/06/12 0530 07/05/12 0600  NA 132* 130*  K 4.2 4.0  CL 97 96  CO2 25 26  GLUCOSE 110* 104*  BUN 14 10  CREATININE 0.87 0.78  CALCIUM 9.0 9.1   PT/INR No results found for this basename: LABPROT:2,INR:2 in the last 72 hours   Lab 07/06/12 0530 07/03/12 0545 07/01/12 2115  AST 60* 35 78*  ALT 117* 80* 134*  ALKPHOS 257* 181* 264*  BILITOT 0.3 0.2* 0.3  PROT 7.0 6.4 6.9  ALBUMIN 2.6* 2.4* 2.7*     Lipase     Component Value Date/Time   LIPASE 43 07/01/2012 2115     Studies/Results: No results found.  Medications:    . aspirin  81 mg Oral Daily  . enoxaparin  40 mg Subcutaneous Q24H  . insulin aspart  0-9 Units Subcutaneous Q6H  . multivitamin with minerals  1 tablet Oral Daily  . piperacillin-tazobactam (ZOSYN)  IV  3.375 g Intravenous Q8H  . sodium chloride  10-40 mL Intracatheter Q12H    Assessment/Plan Right lower quadrant abdominal inflammatory process with fever and leukocytosis, Possible anastomotic leak, not a drainable abscess currently.  S/P  Ileocecectomy , drainage of abscess, for Crohn's dz 05/14/12 Dr. Gerrit Friends  COPD  Bilateral inguinal hernias R>L  GERD  Lovenox/DVT prophylaxis    Plan:  CT tomorrow.   LOS: 5 days    Coline Calkin 07/06/2012

## 2012-07-06 NOTE — Progress Notes (Signed)
Patient interviewed and examined today. I agree with the assessment and treatment plan outlined by Mr. Marlyne Beards.  Physical exam today actually is improving. It is much softer, much less tender and I do not feel a mass.   we will get a CT scan of the abdomen and pelvis with contrast tomorrow. That will be after 6 days of antibiotic therapy. We will reassess his situation after that is done.   Angelia Mould. Derrell Lolling, M.D., [facility] General and Minimally invasive Surgery Breast and Colorectal Surgery Office:   [phone] Pager:   [phone]

## 2012-07-06 NOTE — Telephone Encounter (Signed)
Per Pt request disb form updated and signed by Dr Gerrit Friends with rtw date unknown at this time. Faxed to # on form and received confirmation.

## 2012-07-07 ENCOUNTER — Encounter (HOSPITAL_COMMUNITY): Payer: Self-pay | Admitting: Radiology

## 2012-07-07 ENCOUNTER — Inpatient Hospital Stay (HOSPITAL_COMMUNITY): Payer: BC Managed Care – PPO

## 2012-07-07 LAB — GLUCOSE, CAPILLARY
Glucose-Capillary: 101 mg/dL — ABNORMAL HIGH (ref 70–99)
Glucose-Capillary: 110 mg/dL — ABNORMAL HIGH (ref 70–99)
Glucose-Capillary: 115 mg/dL — ABNORMAL HIGH (ref 70–99)
Glucose-Capillary: 119 mg/dL — ABNORMAL HIGH (ref 70–99)
Glucose-Capillary: 124 mg/dL — ABNORMAL HIGH (ref 70–99)

## 2012-07-07 LAB — CBC
MCH: 28.4 pg (ref 26.0–34.0)
Platelets: 336 10*3/uL (ref 150–400)
RBC: 4.01 MIL/uL — ABNORMAL LOW (ref 4.22–5.81)
RDW: 13.6 % (ref 11.5–15.5)
WBC: 14.7 10*3/uL — ABNORMAL HIGH (ref 4.0–10.5)

## 2012-07-07 MED ORDER — CLINIMIX E/DEXTROSE (5/20) 5 % IV SOLN
INTRAVENOUS | Status: AC
  Administered 2012-07-07: 18:00:00 via INTRAVENOUS
  Filled 2012-07-07: qty 2000

## 2012-07-07 MED ORDER — ALUM & MAG HYDROXIDE-SIMETH 200-200-20 MG/5ML PO SUSP
30.0000 mL | ORAL | Status: DC | PRN
  Administered 2012-07-07 – 2012-07-12 (×7): 30 mL via ORAL
  Filled 2012-07-07 (×7): qty 30

## 2012-07-07 MED ORDER — IOHEXOL 300 MG/ML  SOLN
100.0000 mL | Freq: Once | INTRAMUSCULAR | Status: AC | PRN
  Administered 2012-07-07: 100 mL via INTRAVENOUS

## 2012-07-07 MED ORDER — FAT EMULSION 20 % IV EMUL
250.0000 mL | INTRAVENOUS | Status: AC
  Administered 2012-07-07: 250 mL via INTRAVENOUS
  Filled 2012-07-07: qty 250

## 2012-07-07 NOTE — Progress Notes (Signed)
  Subjective: Says he feels fine, wants to eat, tired of waiting.  Says he has no pain now.  Objective: Vital signs in last 24 hours: Temp:  [97.8 F (36.6 C)-97.9 F (36.6 C)] 97.8 F (36.6 C) (08/09 0531) Pulse Rate:  [54-65] 65  (08/09 0531) Resp:  [16-18] 16  (08/09 0531) BP: (107-125)/(62-73) 125/73 mmHg (08/09 0531) SpO2:  [97 %-98 %] 97 % (08/09 0531) Last BM Date: 07/04/12  Afebrile, VSS, 14.7 WBC,  Intake/Output from previous day: 08/08 0701 - 08/09 0700 In: 0  Out: 610 [Urine:610] Intake/Output this shift:    General appearance: alert, cooperative and no distress GI: soft, non-tender; bowel sounds normal; no masses,  no organomegaly and When you palpate RLQ he says it doesn't hurt, but he can feel something.  Lab Results:   Basename 07/07/12 0500 07/06/12 0530  WBC 14.7* 12.5*  HGB 11.4* 11.5*  HCT 33.3* 34.3*  PLT 336 355    BMET  Basename 07/06/12 0530 07/05/12 0600  NA 132* 130*  K 4.2 4.0  CL 97 96  CO2 25 26  GLUCOSE 110* 104*  BUN 14 10  CREATININE 0.87 0.78  CALCIUM 9.0 9.1   PT/INR No results found for this basename: LABPROT:2,INR:2 in the last 72 hours   Lab 07/06/12 0530 07/03/12 0545 07/01/12 2115  AST 60* 35 78*  ALT 117* 80* 134*  ALKPHOS 257* 181* 264*  BILITOT 0.3 0.2* 0.3  PROT 7.0 6.4 6.9  ALBUMIN 2.6* 2.4* 2.7*     Lipase     Component Value Date/Time   LIPASE 43 07/01/2012 2115     Studies/Results: No results found.  Medications:    . aspirin  81 mg Oral Daily  . enoxaparin  40 mg Subcutaneous Q24H  . insulin aspart  0-9 Units Subcutaneous Q6H  . multivitamin with minerals  1 tablet Oral Daily  . piperacillin-tazobactam (ZOSYN)  IV  3.375 g Intravenous Q8H  . sodium chloride  10-40 mL Intracatheter Q12H    Assessment/Plan Right lower quadrant abdominal inflammatory process with fever and leukocytosis, Possible anastomotic leak, not a drainable abscess currently.  S/P Ileocecectomy , drainage of abscess,  for Crohn's dz 05/14/12 Dr. Gerrit Friends  WBC is up some today COPD  Bilateral inguinal hernias R>L  GERD  Lovenox/DVT prophylaxis   Plan:  CT for today, he's drinking contrast now.       LOS: 6 days    Samuel Montgomery 07/07/2012

## 2012-07-07 NOTE — Progress Notes (Addendum)
I have personally interviewed and examined this patient today. I agree with the evaluation and treatment plan outlined by Mr. Marlyne Beards.  On exam he has minimal tenderness and no mass in the right lower quadrant.  His CT scan is improved. The tissue edema and phlegmon are diminished and I do not see obvious extraluminal air now.  We will continue TNA and antibiotics. Allow clear liquid diet.   Hopefully this will resolve without operative intervention.   Angelia Mould. Derrell Lolling, M.D., [facility] General and Minimally invasive Surgery Breast and Colorectal Surgery Office:   [phone] Pager:   [phone]

## 2012-07-07 NOTE — Progress Notes (Signed)
PARENTERAL NUTRITION CONSULT NOTE - FOLLOW UP  Pharmacy Consult for TNA Indication: SB perforation  Patient Measurements: Height: 5\' 4"  (162.6 cm) Weight: 140 lb 11.2 oz (63.821 kg) IBW/kg (Calculated) : 59.2  Usual Weight: 64 kg  Vital Signs: Temp: 97.8 F (36.6 C) (08/09 0531) Temp src: Oral (08/09 0531) BP: 125/73 mmHg (08/09 0531) Pulse Rate: 65  (08/09 0531) Intake/Output from previous day: 08/08 0701 - 08/09 0700 In: 0  Out: 610 [Urine:610] Intake/Output from this shift:    Labs:  Basename 07/07/12 0500 07/06/12 0530 07/05/12 0600  WBC 14.7* 12.5* 13.6*  HGB 11.4* 11.5* 11.6*  HCT 33.3* 34.3* 33.3*  PLT 336 355 325  APTT -- -- --  INR -- -- --     Basename 07/06/12 0530 07/05/12 0600  NA 132* 130*  K 4.2 4.0  CL 97 96  CO2 25 26  GLUCOSE 110* 104*  BUN 14 10  CREATININE 0.87 0.78  LABCREA -- --  CREAT24HRUR -- --  CALCIUM 9.0 9.1  MG 2.2 2.2  PHOS 4.1 4.4  PROT 7.0 --  ALBUMIN 2.6* --  AST 60* --  ALT 117* --  ALKPHOS 257* --  BILITOT 0.3 --  BILIDIR -- --  IBILI -- --  PREALBUMIN -- --  TRIG -- --  CHOLHDL -- --  CHOL -- --   Estimated Creatinine Clearance: 78.4 ml/min (by C-G formula based on Cr of 0.87).    Basename 07/06/12 2339 07/06/12 1748 07/06/12 1230  GLUCAP 138* 128* 122*    Medications:  Scheduled:     . aspirin  81 mg Oral Daily  . enoxaparin  40 mg Subcutaneous Q24H  . insulin aspart  0-9 Units Subcutaneous Q6H  . multivitamin with minerals  1 tablet Oral Daily  . piperacillin-tazobactam (ZOSYN)  IV  3.375 g Intravenous Q8H  . sodium chloride  10-40 mL Intracatheter Q12H   Infusions:     . dextrose 5 % and 0.45 % NaCl with KCl 20 mEq/L 40 mL/hr at 07/06/12 0320  . fat emulsion 240 mL (07/05/12 1723)  . TPN (CLINIMIX) +/- additives 80 mL/hr at 07/05/12 1724  . TPN (CLINIMIX) +/- additives 80 mL/hr at 07/06/12 1801  . TPN (CLINIMIX) +/- additives      Insulin Requirements in the past 24 hours:  2 units SSI;  CBG's 122-138 No Hx DM  Current Nutrition:  TNA E 5/20 @ 80 ml/hr MIVF: D51/2NS with 20 K/L @ 40 ml/hr  Assessment: Previous small bowel perforation, expl lap 6/16 with ileocecectomy, abscess drainage. MRI 8/2 showed a peri-rectal abscess  CT scan with abd gas collection, no discreet abscess. Zosyn began empirically for leukocytosis Plan cont NPO, TNA, and will rpt CT of abdomen today. Electrolytes  Na remains low, cannot be adjusted in TNA             Other lytes WNL LFTs slightly elevated prior to TNA, but stable, will continue to monitor  Nutritional Goals:  Current goal rate TNA at 80 ml/hr delivers Protein: 96 gm/day Kcal: 1900 kcal/day avg, (2169 kcal/day w lipids, 1689 kcal/day without lipids).  Plan:   Continue Clinimix E5/20 at goal rate 80 ml/hr  Continue MIVF to 40 ml/hr  Lipids MWF only d/t shortage  Pt on PO MVI daily will not add IV MVI, or trace elements.  TNA labs: Monday and Thursday  Otho Bellows PharmD [phone] 7:10 AM 07/07/2012

## 2012-07-08 LAB — GLUCOSE, CAPILLARY: Glucose-Capillary: 141 mg/dL — ABNORMAL HIGH (ref 70–99)

## 2012-07-08 MED ORDER — CLINIMIX E/DEXTROSE (5/20) 5 % IV SOLN
INTRAVENOUS | Status: AC
  Administered 2012-07-08: 18:00:00 via INTRAVENOUS
  Filled 2012-07-08: qty 2000

## 2012-07-08 NOTE — Progress Notes (Signed)
ANTIBIOTIC CONSULT NOTE  Pharmacy Consult for Zosyn Indication: Presumed abdominal infection  Allergies  Allergen Reactions  . Wellbutrin (Bupropion Hcl)     Neg CNS. As of 01/04/2012 patient states he is not allergic to  Wellbutrin.    Patient Measurements: Height: 5\' 4"  (162.6 cm) Weight: 140 lb 11.2 oz (63.821 kg) IBW/kg (Calculated) : 59.2     Vital Signs: Temp: 98.9 F (37.2 C) (08/10 0630) Temp src: Oral (08/10 0630) BP: 118/69 mmHg (08/10 0630) Pulse Rate: 74  (08/10 0630)  Labs:  Basename 07/07/12 0500 07/06/12 0530  WBC 14.7* 12.5*  HGB 11.4* 11.5*  PLT 336 355  LABCREA -- --  CREATININE -- 0.87   Estimated Creatinine Clearance: 78.4 ml/min (by C-G formula based on Cr of 0.87).   Microbiology: Recent Results (from the past 720 hour(s))  URINE CULTURE     Status: Normal   Collection Time   07/01/12  9:40 PM      Component Value Range Status Comment   Specimen Description URINE, CLEAN CATCH   Final    Special Requests NONE   Final    Culture  Setup Time 07/02/2012 02:31   Final    Colony Count NO GROWTH   Final    Culture NO GROWTH   Final    Report Status 07/03/2012 FINAL   Final   CLOSTRIDIUM DIFFICILE BY PCR     Status: Normal   Collection Time   07/02/12  7:09 AM      Component Value Range Status Comment   C difficile by pcr NEGATIVE  NEGATIVE Final     Medications:  Anti-infectives     Start     Dose/Rate Route Frequency Ordered Stop   07/02/12 0500  piperacillin-tazobactam (ZOSYN) IVPB 3.375 g       3.375 g 12.5 mL/hr over 240 Minutes Intravenous Every 8 hours 07/02/12 1478             Assessment:  56 year old male with h/o Crohn's disease with peri-rectal abscess seen on MRI 8/2.  S/P Ileocecectomy , drainage of abscess, for Crohn's dz 05/14/12.  Now with Right lower quadrant abdominal inflammatory process with fever and leukocytosis  Day # 7 Zosyn for suspected abdominal infection  Renal function remains stable  Urine culture  negative, C.Diff negative, no other culture data   Goal of Therapy:  Eradication of suspected infection  Plan:   Continue Zosyn 3.375gm IV q8h (extended infusion)  Follow up intended duration of abx.   Lynann Beaver PharmD, BCPS Pager [phone] 07/08/2012 8:30 AM

## 2012-07-08 NOTE — Progress Notes (Signed)
PARENTERAL NUTRITION CONSULT NOTE - FOLLOW UP  Pharmacy Consult for TNA Indication: SB perforation  Patient Measurements: Height: 5\' 4"  (162.6 cm) Weight: 140 lb 11.2 oz (63.821 kg) IBW/kg (Calculated) : 59.2  Usual Weight: 64 kg  Vital Signs: Temp: 98.9 F (37.2 C) (08/10 0630) Temp src: Oral (08/10 0630) BP: 118/69 mmHg (08/10 0630) Pulse Rate: 74  (08/10 0630) Intake/Output from previous day: 08/09 0701 - 08/10 0700 In: 2830 [P.O.:320; I.V.:120; IV Piggyback:150; TPN:2240] Out: -   Labs:  Basename 07/07/12 0500 07/06/12 0530  WBC 14.7* 12.5*  HGB 11.4* 11.5*  HCT 33.3* 34.3*  PLT 336 355  APTT -- --  INR -- --     Basename 07/06/12 0530  NA 132*  K 4.2  CL 97  CO2 25  GLUCOSE 110*  BUN 14  CREATININE 0.87  LABCREA --  CREAT24HRUR --  CALCIUM 9.0  MG 2.2  PHOS 4.1  PROT 7.0  ALBUMIN 2.6*  AST 60*  ALT 117*  ALKPHOS 257*  BILITOT 0.3  BILIDIR --  IBILI --  PREALBUMIN --  TRIG --  CHOLHDL --  CHOL --   Estimated Creatinine Clearance: 78.4 ml/min (by C-G formula based on Cr of 0.87).    Basename 07/08/12 0605 07/07/12 2352 07/07/12 1830  GLUCAP 141* 110* 123*    Medications:  Infusions:     . dextrose 5 % and 0.45 % NaCl with KCl 20 mEq/L 40 mL/hr at 07/08/12 0521  . fat emulsion 500 kcal (07/08/12 0553)  . TPN (CLINIMIX) +/- additives 80 mL/hr at 07/06/12 1801  . TPN (CLINIMIX) +/- additives 80 mL/hr at 07/08/12 0553    Nutritional Goals:   RD recs  Kcal: 1850-2050, Protein: 75-90 gm  Clinimix 5/20 at goal rate of 80 ml/hr + Lipids MWF to provide 96 g protein/day and an average of  1896 Kcal/day   Current nutrition:   TPN at goal rate  Diet: clear liquids (8/9)  IVF: D51/2NS with 20 KCL @ 40 ml/hr  CBGs & Insulin requirements past 24 hours:  CBG's 110-141;  2 units SSI No Hx DM  Assessment:   Previous small bowel perforation, expl lap 6/16 with ileocecectomy, abscess drainage. MRI 8/2 showed a peri-rectal abscess.  TPN  started 07/02/12, increased to goal rate on 8/7.  Repeat CT 8/9 shows improvement; clear liquid diet started 8/9.  Electrolytes: Na low (8/9), cannot be adjusted in TNA; Other lytes WNL  LFTs slightly elevated prior to TNA, but stable, will continue to monitor  Pre-Albumin: 8. (8/5)  TG/Cholesterol: wnl (8/5)  GI prophylaxis:    Plan:   Continue Clinimix E 5/20 at 80 ml/hr Fat emulsion at 10 ml/hr (MWF only due to ongoing shortage). Continue PO multivitamin daily  Continue IVF at 40 ml/hr. Continue SSI TNA lab panels on Mondays & Thursdays. F/u daily.    Lynann Beaver PharmD, BCPS Pager [phone] 07/08/2012 8:11 AM

## 2012-07-08 NOTE — Progress Notes (Signed)
Subjective: Does not feel well in general after trying clear liquids.  No increase in pain.  Objective: Vital signs in last 24 hours: Temp:  [98.2 F (36.8 C)-98.9 F (37.2 C)] 98.9 F (37.2 C) (08/10 0630) Pulse Rate:  [61-76] 74  (08/10 0630) Resp:  [17-18] 18  (08/10 0630) BP: (97-126)/(61-69) 118/69 mmHg (08/10 0630) SpO2:  [96 %-99 %] 96 % (08/10 0630) Last BM Date: 07/07/12  Intake/Output from previous day: 08/09 0701 - 08/10 0700 In: 2830 [P.O.:320; I.V.:120; IV Piggyback:150; TPN:2240] Out: -  Intake/Output this shift:    PE: Abd-soft, mild RLQ tenderness  Lab Results:   Basename 07/07/12 0500 07/06/12 0530  WBC 14.7* 12.5*  HGB 11.4* 11.5*  HCT 33.3* 34.3*  PLT 336 355   BMET  Basename 07/06/12 0530  NA 132*  K 4.2  CL 97  CO2 25  GLUCOSE 110*  BUN 14  CREATININE 0.87  CALCIUM 9.0   PT/INR No results found for this basename: LABPROT:2,INR:2 in the last 72 hours Comprehensive Metabolic Panel:    Component Value Date/Time   NA 132* 07/06/2012 0530   K 4.2 07/06/2012 0530   CL 97 07/06/2012 0530   CO2 25 07/06/2012 0530   BUN 14 07/06/2012 0530   CREATININE 0.87 07/06/2012 0530   CREATININE 0.91 01/04/2012 1230   GLUCOSE 110* 07/06/2012 0530   CALCIUM 9.0 07/06/2012 0530   AST 60* 07/06/2012 0530   ALT 117* 07/06/2012 0530   ALKPHOS 257* 07/06/2012 0530   BILITOT 0.3 07/06/2012 0530   PROT 7.0 07/06/2012 0530   ALBUMIN 2.6* 07/06/2012 0530     Studies/Results: Ct Abdomen Pelvis W Contrast  07/07/2012  *RADIOLOGY REPORT*  Clinical Data: Follow-up abscess status post ileocecectomy for Crohn's disease 2 months ago.  Leukocytosis and abdominal pain.  CT ABDOMEN AND PELVIS WITH CONTRAST  Technique:  Multidetector CT imaging of the abdomen and pelvis was performed following the standard protocol during bolus administration of intravenous contrast.  Contrast: OMNIPAQUE IOHEXOL 300 MG/ML  SOLN  Comparison: Abdominal pelvic CT 07/02/2012 and 05/13/2012.  Findings: The  basilar aeration has improved.  There is minimal residual atelectasis and a stable calcified left lower lobe granuloma.  The liver, spleen, gallbladder, biliary system, pancreas, adrenal glands and kidneys appear stable without suspicious findings.  Recently demonstrated right lower quadrant inflammatory process has improved with less mesenteric edema and adjacent peritoneal fluid. There is still some wall thickening of the distal small bowel adjacent to the ileocolonic anastomosis.  Several of the bowel loops appear matted in this area.  However, contrast has passed into the colon.  There is no evidence of obstruction or extravasated enteric contrast.  There is no obvious fistula or drainable fluid collection.  Subhepatic fluid has decreased in volume.  There are persistent reactive lymph nodes in the ileocolonic mesentery.  There is mild sigmoid colon wall thickening.  No pelvic fluid collection is identified.  The bladder appears normal without air. There is stable lower lumbar spondylosis.  IMPRESSION:  1.  Interval improved right lower quadrant inflammatory process with decreased mesenteric edema and peritoneal/interloop fluid.  No drainable abscess, discrete fistula or obstruction identified. There is persistent wall thickening of the distal small bowel and proximal colon. 2.  Stable associated reactive adenopathy. 3.  No new findings identified.  Original Report Authenticated By: Gerrianne Scale, M.D.    Anti-infectives: Anti-infectives     Start     Dose/Rate Route Frequency Ordered Stop   07/02/12 0500  piperacillin-tazobactam (ZOSYN) IVPB 3.375 g       3.375 g 12.5 mL/hr over 240 Minutes Intravenous Every 8 hours 07/02/12 0412            Assessment Active Problems:  RLQ inflammatory/infectious process following ileocolectomy for Crohn's disease 05/14/12-improved on CT scan:  WBC up some yesterday.  Did not tolerate clear liquid diet.  CT findings explained to him in detail.     LOS: 7  days   Plan: Stop clear liquids.  Repeat WBC tomorrow.  Continue TPN and IV abxs.   Samuel Montgomery 07/08/2012

## 2012-07-09 LAB — CBC
HCT: 33 % — ABNORMAL LOW (ref 39.0–52.0)
Hemoglobin: 11.2 g/dL — ABNORMAL LOW (ref 13.0–17.0)
RBC: 3.93 MIL/uL — ABNORMAL LOW (ref 4.22–5.81)
WBC: 15.7 10*3/uL — ABNORMAL HIGH (ref 4.0–10.5)

## 2012-07-09 LAB — GLUCOSE, CAPILLARY
Glucose-Capillary: 120 mg/dL — ABNORMAL HIGH (ref 70–99)
Glucose-Capillary: 129 mg/dL — ABNORMAL HIGH (ref 70–99)

## 2012-07-09 MED ORDER — CLINIMIX E/DEXTROSE (5/20) 5 % IV SOLN
INTRAVENOUS | Status: AC
  Administered 2012-07-09: 17:00:00 via INTRAVENOUS
  Filled 2012-07-09: qty 2000

## 2012-07-09 MED ORDER — FLUCONAZOLE 100MG IVPB
100.0000 mg | INTRAVENOUS | Status: DC
  Administered 2012-07-09 – 2012-07-13 (×5): 100 mg via INTRAVENOUS
  Filled 2012-07-09 (×5): qty 50

## 2012-07-09 MED ORDER — SODIUM CHLORIDE 0.9 % IV SOLN
1.0000 g | INTRAVENOUS | Status: DC
  Administered 2012-07-09 – 2012-07-17 (×9): 1 g via INTRAVENOUS
  Filled 2012-07-09 (×9): qty 1

## 2012-07-09 NOTE — Progress Notes (Signed)
Subjective: Feels better today than he did yesterday.  Walking in the halls.  Objective: Vital signs in last 24 hours: Temp:  [97.6 F (36.4 C)-98.7 F (37.1 C)] 98 F (36.7 C) (08/11 0545) Pulse Rate:  [64-69] 64  (08/11 0545) Resp:  [16-18] 17  (08/11 0545) BP: (100-123)/(61-69) 123/69 mmHg (08/11 0545) SpO2:  [95 %-98 %] 95 % (08/11 0545) Last BM Date: 07/08/12  Intake/Output from previous day: 08/10 0701 - 08/11 0700 In: 250 [P.O.:250] Out: 700 [Urine:700] Intake/Output this shift:    PE: Abd-soft, no significant tenderness  Lab Results:   Basename 07/09/12 0557 07/07/12 0500  WBC 15.7* 14.7*  HGB 11.2* 11.4*  HCT 33.0* 33.3*  PLT 376 336   BMET No results found for this basename: NA:2,K:2,CL:2,CO2:2,GLUCOSE:2,BUN:2,CREATININE:2,CALCIUM:2 in the last 72 hours PT/INR No results found for this basename: LABPROT:2,INR:2 in the last 72 hours Comprehensive Metabolic Panel:    Component Value Date/Time   NA 132* 07/06/2012 0530   K 4.2 07/06/2012 0530   CL 97 07/06/2012 0530   CO2 25 07/06/2012 0530   BUN 14 07/06/2012 0530   CREATININE 0.87 07/06/2012 0530   CREATININE 0.91 01/04/2012 1230   GLUCOSE 110* 07/06/2012 0530   CALCIUM 9.0 07/06/2012 0530   AST 60* 07/06/2012 0530   ALT 117* 07/06/2012 0530   ALKPHOS 257* 07/06/2012 0530   BILITOT 0.3 07/06/2012 0530   PROT 7.0 07/06/2012 0530   ALBUMIN 2.6* 07/06/2012 0530     Studies/Results: Ct Abdomen Pelvis W Contrast  07/07/2012  *RADIOLOGY REPORT*  Clinical Data: Follow-up abscess status post ileocecectomy for Crohn's disease 2 months ago.  Leukocytosis and abdominal pain.  CT ABDOMEN AND PELVIS WITH CONTRAST  Technique:  Multidetector CT imaging of the abdomen and pelvis was performed following the standard protocol during bolus administration of intravenous contrast.  Contrast: OMNIPAQUE IOHEXOL 300 MG/ML  SOLN  Comparison: Abdominal pelvic CT 07/02/2012 and 05/13/2012.  Findings: The basilar aeration has improved.  There is  minimal residual atelectasis and a stable calcified left lower lobe granuloma.  The liver, spleen, gallbladder, biliary system, pancreas, adrenal glands and kidneys appear stable without suspicious findings.  Recently demonstrated right lower quadrant inflammatory process has improved with less mesenteric edema and adjacent peritoneal fluid. There is still some wall thickening of the distal small bowel adjacent to the ileocolonic anastomosis.  Several of the bowel loops appear matted in this area.  However, contrast has passed into the colon.  There is no evidence of obstruction or extravasated enteric contrast.  There is no obvious fistula or drainable fluid collection.  Subhepatic fluid has decreased in volume.  There are persistent reactive lymph nodes in the ileocolonic mesentery.  There is mild sigmoid colon wall thickening.  No pelvic fluid collection is identified.  The bladder appears normal without air. There is stable lower lumbar spondylosis.  IMPRESSION:  1.  Interval improved right lower quadrant inflammatory process with decreased mesenteric edema and peritoneal/interloop fluid.  No drainable abscess, discrete fistula or obstruction identified. There is persistent wall thickening of the distal small bowel and proximal colon. 2.  Stable associated reactive adenopathy. 3.  No new findings identified.  Original Report Authenticated By: Gerrianne Scale, M.D.    Anti-infectives: Anti-infectives     Start     Dose/Rate Route Frequency Ordered Stop   07/02/12 0500   piperacillin-tazobactam (ZOSYN) IVPB 3.375 g        3.375 g 12.5 mL/hr over 240 Minutes Intravenous Every 8 hours  07/02/12 0412            Assessment Active Problems:  RLQ inflammatory/infectious process following ileocolectomy for Crohn's disease 05/14/12-improved on 8/9 CT scan;  WBC up a little today.  Feels better clinically.   LOS: 8 days   Plan:  Change antibiotics to InVanz and diflucan.  Sips of clear liquids.  Repeat  CBC tomorrow.   Neilah Fulwider J 07/09/2012

## 2012-07-09 NOTE — Progress Notes (Signed)
PARENTERAL NUTRITION CONSULT NOTE - FOLLOW UP  Pharmacy Consult for TNA Indication: SB perforation  Patient Measurements: Height: 5\' 4"  (162.6 cm) Weight: 140 lb 11.2 oz (63.821 kg) IBW/kg (Calculated) : 59.2  Usual Weight: 64 kg  Vital Signs: Temp: 98 F (36.7 C) (08/11 0545) Temp src: Oral (08/11 0545) BP: 123/69 mmHg (08/11 0545) Pulse Rate: 64  (08/11 0545) Intake/Output from previous day: 08/10 0701 - 08/11 0700 In: 250 [P.O.:250] Out: 700 [Urine:700]  Labs:  Basename 07/09/12 0557 07/07/12 0500  WBC 15.7* 14.7*  HGB 11.2* 11.4*  HCT 33.0* 33.3*  PLT 376 336  APTT -- --  INR -- --    No results found for this basename: NA:3,K:3,CL:3,CO2:3,GLUCOSE:3,BUN:3,CREATININE:3,LABCREA:3,CREAT24HRUR:3,CALCIUM:3,MG:3,PHOS:3,PROT:3,ALBUMIN:3,AST:3,ALT:3,ALKPHOS:3,BILITOT:3,BILIDIR:3,IBILI:3,PREALBUMIN:3,TRIG:3,CHOLHDL:3,CHOL:3 in the last 72 hours Estimated Creatinine Clearance: 78.4 ml/min (by C-G formula based on Cr of 0.87).    Basename 07/09/12 0547 07/09/12 0022 07/08/12 1753  GLUCAP 120* 129* 132*    Medications:  Infusions:     . dextrose 5 % and 0.45 % NaCl with KCl 20 mEq/L 40 mL/hr at 07/08/12 0521  . fat emulsion 500 kcal (07/08/12 0553)  . TPN (CLINIMIX) +/- additives 80 mL/hr at 07/08/12 0553  . TPN (CLINIMIX) +/- additives 80 mL/hr at 07/08/12 1748    Nutritional Goals:   RD recs  Kcal: 1850-2050, Protein: 75-90 gm  Clinimix 5/20 at goal rate of 80 ml/hr + Lipids MWF to provide 96 g protein/day and an average of  1896 Kcal/day   Current nutrition:   TPN at goal rate  Diet: NPO 8/10 (clear liquids 8/9-8/10)  IVF: D51/2NS with 20 KCL @ 40 ml/hr  CBGs & Insulin requirements past 24 hours:  CBG's 120-141;  4 units SSI No Hx DM  Assessment:   Previous small bowel perforation, expl lap 6/16 with ileocecectomy, abscess drainage. MRI 8/2 showed a peri-rectal abscess.  TPN started 07/02/12, increased to goal rate on 8/7.  Repeat CT 8/9 shows  improvement; clear liquid diet started 8/9, but pt did not tolerate well and was changed back to NPO on 8/10.  Electrolytes: Na low (8/9), cannot be adjusted in TNA; Other lytes WNL (8/8)  LFTs slightly elevated prior to TNA, but stable, will continue to monitor  Pre-Albumin: 8. (8/5)  TG/Cholesterol: wnl (8/5)  GI prophylaxis:    Plan:   Continue Clinimix E 5/20 at 80 ml/hr Fat emulsion at 10 ml/hr (MWF only due to ongoing shortage). Continue PO multivitamin daily  Continue IVF at 40 ml/hr. Continue SSI TNA lab panels on Mondays & Thursdays. F/u daily.   Lynann Beaver PharmD, BCPS Pager [phone] 07/09/2012 7:17 AM

## 2012-07-10 DIAGNOSIS — E46 Unspecified protein-calorie malnutrition: Secondary | ICD-10-CM

## 2012-07-10 LAB — DIFFERENTIAL
Basophils Absolute: 0.1 10*3/uL (ref 0.0–0.1)
Eosinophils Absolute: 0.6 10*3/uL (ref 0.0–0.7)
Eosinophils Relative: 4 % (ref 0–5)
Lymphocytes Relative: 11 % — ABNORMAL LOW (ref 12–46)

## 2012-07-10 LAB — COMPREHENSIVE METABOLIC PANEL
AST: 43 U/L — ABNORMAL HIGH (ref 0–37)
BUN: 14 mg/dL (ref 6–23)
CO2: 25 mEq/L (ref 19–32)
Chloride: 101 mEq/L (ref 96–112)
Creatinine, Ser: 0.71 mg/dL (ref 0.50–1.35)
GFR calc non Af Amer: >90 mL/min (ref >90)
Glucose, Bld: 123 mg/dL — ABNORMAL HIGH (ref 70–99)
Total Bilirubin: 0.4 mg/dL (ref 0.3–1.2)

## 2012-07-10 LAB — CBC
MCV: 85.2 fL (ref 78.0–100.0)
Platelets: 426 10*3/uL — ABNORMAL HIGH (ref 150–400)
RDW: 13.9 % (ref 11.5–15.5)
WBC: 15.1 10*3/uL — ABNORMAL HIGH (ref 4.0–10.5)

## 2012-07-10 LAB — GLUCOSE, CAPILLARY
Glucose-Capillary: 113 mg/dL — ABNORMAL HIGH (ref 70–99)
Glucose-Capillary: 124 mg/dL — ABNORMAL HIGH (ref 70–99)
Glucose-Capillary: 117 mg/dL — ABNORMAL HIGH (ref 70–99)
Glucose-Capillary: 117 mg/dL — ABNORMAL HIGH (ref 70–99)

## 2012-07-10 LAB — CHOLESTEROL, TOTAL: Cholesterol: 93 mg/dL (ref 0–200)

## 2012-07-10 MED ORDER — FAT EMULSION 20 % IV EMUL
240.0000 mL | INTRAVENOUS | Status: AC
  Administered 2012-07-10: 240 mL via INTRAVENOUS
  Filled 2012-07-10: qty 250

## 2012-07-10 MED ORDER — PNEUMOCOCCAL VAC POLYVALENT 25 MCG/0.5ML IJ INJ
0.5000 mL | INJECTION | Freq: Once | INTRAMUSCULAR | Status: AC
  Administered 2012-07-10: 0.5 mL via INTRAMUSCULAR
  Filled 2012-07-10: qty 0.5

## 2012-07-10 MED ORDER — CLINIMIX E/DEXTROSE (5/20) 5 % IV SOLN
INTRAVENOUS | Status: AC
  Administered 2012-07-10: 18:00:00 via INTRAVENOUS
  Filled 2012-07-10: qty 2000

## 2012-07-10 NOTE — Progress Notes (Signed)
Patient ID: Samuel Montgomery, male   DOB: 1956-04-16, 56 y.o.   MRN: [id number]    Subjective: Pt continues to feel a little better.  Feels like his pain may be mildly worse than Saturday, but states "it's the first of the morning, it will likely get better throughout the day."  No nausea, small BMs.  Wants to retry clear liquids.  Objective: Vital signs in last 24 hours: Temp:  [97.8 F (36.6 C)-98.7 F (37.1 C)] 98.7 F (37.1 C) (08/12 0607) Pulse Rate:  [56-61] 61  (08/12 0607) Resp:  [15-16] 15  (08/12 0607) BP: (100-116)/(64-69) 116/69 mmHg (08/12 0607) SpO2:  [93 %-98 %] 95 % (08/12 0607) Last BM Date: 07/09/12  Intake/Output from previous day: 08/11 0701 - 08/12 0700 In: 780 [P.O.:780] Out: 700 [Urine:700] Intake/Output this shift:    PE: Abd: soft, mild RLQ tenderness, +BS, ND Heart: regular Lungs: CTAB  Lab Results:   Basename 07/10/12 0610 07/09/12 0557  WBC 15.1* 15.7*  HGB 10.9* 11.2*  HCT 33.5* 33.0*  PLT 426* 376   BMET  Basename 07/10/12 0610  NA 134*  K 4.1  CL 101  CO2 25  GLUCOSE 123*  BUN 14  CREATININE 0.71  CALCIUM 9.1   PT/INR No results found for this basename: LABPROT:2,INR:2 in the last 72 hours CMP     Component Value Date/Time   NA 134* 07/10/2012 0610   K 4.1 07/10/2012 0610   CL 101 07/10/2012 0610   CO2 25 07/10/2012 0610   GLUCOSE 123* 07/10/2012 0610   BUN 14 07/10/2012 0610   CREATININE 0.71 07/10/2012 0610   CREATININE 0.91 01/04/2012 1230   CALCIUM 9.1 07/10/2012 0610   PROT 7.3 07/10/2012 0610   ALBUMIN 2.6* 07/10/2012 0610   AST 43* 07/10/2012 0610   ALT 84* 07/10/2012 0610   ALKPHOS 244* 07/10/2012 0610   BILITOT 0.4 07/10/2012 0610   GFRNONAA >90 07/10/2012 0610   GFRAA >90 07/10/2012 0610   Lipase     Component Value Date/Time   LIPASE 43 07/01/2012 2115       Studies/Results: No results found.  Anti-infectives: Anti-infectives     Start     Dose/Rate Route Frequency Ordered Stop   07/09/12 1000   ertapenem (INVANZ)  1 g in sodium chloride 0.9 % 50 mL IVPB        1 g 100 mL/hr over 30 Minutes Intravenous Every 24 hours 07/09/12 0949     07/09/12 1000   fluconazole (DIFLUCAN) IVPB 100 mg        100 mg 50 mL/hr over 60 Minutes Intravenous Every 24 hours 07/09/12 0949     07/02/12 0500   piperacillin-tazobactam (ZOSYN) IVPB 3.375 g  Status:  Discontinued        3.375 g 12.5 mL/hr over 240 Minutes Intravenous Every 8 hours 07/02/12 0412 07/09/12 0948           Assessment/Plan 1.  s/p ileocecectomy for Crohn's disease 2. RLQ inflammatory process 3. Leukocytosis - stable 4. H/o Crohn's disease  Plan: 1. Will reallow clear liquids as the patient is doing well with his sips of liquids 2. Continue Invanz and Diflucan, repeat labs in the morning 3. May need repeat CT scan in several days.   LOS: 9 days    Aren Pryde E 07/10/2012

## 2012-07-10 NOTE — Progress Notes (Signed)
Initial visit with pt - referral by nursing.   Pt communicated feeling frustration and "edges of depression" d/t extended hospitalization and course of illness.  Pt has not been able to engage in activities at the core of his identity.   Pt copes through riding motorcycle and has been unable to ride since June.  Spoke with chaplain about missing his bike.  Pt spoke with chaplain about being a 'guy who does things... Doesn't let fate come to me' and processed frustration around feeling "stuck" and unable to do anything to help situation right now.   Pt also spoke with chaplain about worry around financial and employment.  Pt has used sick leave and is now on part time disability.  Unsure whether he will keep his job.   Pt's sister is in palliative care at [facility].   Chaplain provided emotional support and worked with pt to process fears and frustrations - brainstorming coping skills or activities that might allow pt to feel more like himself in the hospital.   Will continue to follow for support during admission   Belva Crome  MDiv, Chaplain    07/10/12 1700  Clinical Encounter Type  Visited With Patient  Visit Type Initial;Psychological support;Spiritual support;Social support  Referral From Nurse  Consult/Referral To Nurse;Chaplain  Recommendations Follow up for support around extended illness and hospitalization   Spiritual Encounters  Spiritual Needs Emotional;Other (Comment) (Frustration, anxiety )  Stress Factors  Patient Stress Factors Major life changes;Health changes;Financial concerns;Other (Comment) (frustration )

## 2012-07-10 NOTE — Progress Notes (Signed)
Agree with note per KO,PA. He says that he has been ambulating. He then passing a lot of gas and some bowel movements. He doesn't have any energy but otherwise feels okay just tired and depressed. Not having much to have abdominal pain. Trying some liquids. No appetite, food doesn't taste good.  Exam: Patient is alert and oriented, appears comfortable. His abdomen appears soft although he does have a little tenderness to deep palpation the right lower quadrant. There is not significant guarding and no rebound. Bowel sounds are present. He is not distended.  Plan: I. Along discussion with the patient. We just changed his antibiotics yesterday and need to see if this change improves his situation. I reviewed his CT scans with the radiologist. CT scan since admission are basically about the same with no significant improvement on the one hand but no worsening on the other. There is no drainable collection. I discussed with the patient having a repeat CT scan about 6 or 7 days following the most recent and then make some decisions. His scan shows clear-cut improvement we can continue with antibiotic therapy. If he is no better or worse then we will need to reconsider surgery.although the inflammatory process did contain some air bubbles, there is contrast going through and no leak of contrast so I don't think he has an active leak at the anastomosis.

## 2012-07-10 NOTE — Progress Notes (Signed)
PARENTERAL NUTRITION CONSULT NOTE - FOLLOW UP  Pharmacy Consult for TNA Indication: SB perforation  Patient Measurements: Height: 5\' 4"  (162.6 cm) Weight: 140 lb 11.2 oz (63.821 kg) IBW/kg (Calculated) : 59.2  Usual Weight: 64 kg  Vital Signs: Temp: 98.7 F (37.1 C) (08/12 0607) Temp src: Oral (08/12 0607) BP: 116/69 mmHg (08/12 0607) Pulse Rate: 61  (08/12 0607) Intake/Output from previous day: 08/11 0701 - 08/12 0700 In: 780 [P.O.:780] Out: 700 [Urine:700]  Labs:  [facility] 07/10/12 0610 07/09/12 0557  WBC 15.1* 15.7*  HGB 10.9* 11.2*  HCT 33.5* 33.0*  PLT 426* 376  APTT -- --  INR -- --     Basename 07/10/12 0610  NA 134*  K 4.1  CL 101  CO2 25  GLUCOSE 123*  BUN 14  CREATININE 0.71  LABCREA --  CREAT24HRUR --  CALCIUM 9.1  MG 2.3  PHOS 3.4  PROT 7.3  ALBUMIN 2.6*  AST 43*  ALT 84*  ALKPHOS 244*  BILITOT 0.4  BILIDIR --  IBILI --  PREALBUMIN --  TRIG 102  CHOLHDL --  CHOL 93   Estimated Creatinine Clearance: 85.3 ml/min (by C-G formula based on Cr of 0.71).    Basename 07/10/12 0604 07/09/12 2342 07/09/12 1815  GLUCAP 124* 113* 116*    Medications:  Infusions:     . dextrose 5 % and 0.45 % NaCl with KCl 20 mEq/L 40 mL/hr at 07/10/12 0456  . TPN (CLINIMIX) +/- additives 80 mL/hr at 07/08/12 1748  . TPN (CLINIMIX) +/- additives 80 mL/hr at 07/09/12 1708    Nutritional Goals:   RD recs  Kcal: 1850-2050, Protein: 75-90 gm  Clinimix 5/20 at goal rate of 80 ml/hr + Lipids MWF to provide 96 g protein/day and an average of  1896 Kcal/day   Current nutrition:   TPN at goal rate  Diet: CL  IVF: D51/2NS with 20 KCL @ 40 ml/hr  CBGs & Insulin requirements past 24 hours:  CBG's 113-124;  2 units SSI (sensitive q6h) No Hx DM  Assessment:   Previous small bowel perforation, expl lap 6/16 with ileocecectomy, abscess drainage. MRI 8/2 showed a peri-rectal abscess.  TPN started 07/02/12, increased to goal rate on 8/7. CT per MD notes shows  no change but no worsening  Clear liquid diet started 8/9, but pt did not tolerate well and was changed back to NPO on 8/10. Going to attempt CL diet again today  Electrolytes: Na low, cannot be adjusted in TNA; Other lytes WNL (8/8)  LFTs slightly elevated prior to TNA, but stable, will continue to monitor  Pre-Albumin: 8.9 (8/5), today's value pending  TG/Cholesterol: wnl (8/5 and 8/12)  Plan:   Continue Clinimix E 5/20 at 80 ml/hr Fat emulsion at 10 ml/hr (MWF only due to ongoing shortage). Continue PO multivitamin daily  Continue IVF at 40 ml/hr. Continue SSI TNA lab panels on Mondays & Thursdays. F/u daily.  Gwen Her PharmD  [phone] 07/10/2012 10:34 AM

## 2012-07-11 LAB — BASIC METABOLIC PANEL
CO2: 23 mEq/L (ref 19–32)
Calcium: 8.8 mg/dL (ref 8.4–10.5)
Chloride: 100 mEq/L (ref 96–112)
GFR calc Af Amer: >90 mL/min (ref >90)
Sodium: 133 mEq/L — ABNORMAL LOW (ref 135–145)

## 2012-07-11 LAB — CBC
MCH: 29.2 pg (ref 26.0–34.0)
Platelets: 462 10*3/uL — ABNORMAL HIGH (ref 150–400)
RBC: 3.91 MIL/uL — ABNORMAL LOW (ref 4.22–5.81)
RDW: 14.2 % (ref 11.5–15.5)
WBC: 16.8 10*3/uL — ABNORMAL HIGH (ref 4.0–10.5)

## 2012-07-11 LAB — GLUCOSE, CAPILLARY
Glucose-Capillary: 114 mg/dL — ABNORMAL HIGH (ref 70–99)
Glucose-Capillary: 112 mg/dL — ABNORMAL HIGH (ref 70–99)

## 2012-07-11 MED ORDER — PRO-STAT SUGAR FREE PO LIQD
30.0000 mL | Freq: Every day | ORAL | Status: DC
  Administered 2012-07-11: 30 mL via ORAL
  Filled 2012-07-11 (×2): qty 30

## 2012-07-11 MED ORDER — CLINIMIX E/DEXTROSE (5/20) 5 % IV SOLN
INTRAVENOUS | Status: AC
  Administered 2012-07-11: 18:00:00 via INTRAVENOUS
  Filled 2012-07-11: qty 2000

## 2012-07-11 NOTE — Progress Notes (Signed)
Seems to feel a bit better this PM. Long talk and review of his situation with patient and his mother. Will plan CT Thursday to evaluate progress and make plans. Told him he may need surgery and if he does will likely have an ostomy for several months

## 2012-07-11 NOTE — Progress Notes (Signed)
Nutrition Follow-up  Intervention: TNA per pharmacy - re-estimated pt's protein needs, recommend increasing protein to prevent further weight loss. Pt does not want to change current carbohydrates from TNA. Diet advancement per MD. Pt interested in trying Prostat for additional protein, will order one time dose to see if pt likes it and follow up tomorrow. Will monitor.   Diet Order: Clear liquid  TNA: Clinimix E 5/20 @ 80 ml/hr.  Lipids (20% IVFE @ 10 ml/hr), multivitamins, and trace elements are provided 3 times weekly (MWF) due to national backorder.  Provides 1896 kcal and 96 grams protein daily (based on weekly average).  Meets 102% minimum estimated kcal and 128% minimum estimated protein needs.  Additional IVF with D5 1/2 NS  @ 40 ml/hr.  - Met with pt who states he has been doing well with clear liquids. Pt's c/o pain when he wakes up every morning but states it gets better throughout the day. Pt reports weighing himself today on the standing scale and he weighed 138.5 pounds which his 1.5 pounds down from his admitting weight. Pt concerned about this as he lost more than 20 pounds unintentionally when he came in and doesn't want to lose any more weight. Repeat CT scan on 8/9 showed improvement in right lower quadrant inflammatory process per MD notes.   - Pt's Alk phos, ALT/AST are elevated but trending down - CBGs controlled - PALB improving - was 8.9 mg/dL on 8/5 and 11.9 mg/dL yesterday  Meds: Scheduled Meds:   . aspirin  81 mg Oral Daily  . enoxaparin  40 mg Subcutaneous Q24H  . ertapenem  1 g Intravenous Q24H  . fluconazole (DIFLUCAN) IV  100 mg Intravenous Q24H  . insulin aspart  0-9 Units Subcutaneous Q6H  . multivitamin with minerals  1 tablet Oral Daily  . sodium chloride  10-40 mL Intracatheter Q12H   Continuous Infusions:   . dextrose 5 % and 0.45 % NaCl with KCl 20 mEq/L 40 mL/hr at 07/11/12 0634  . fat emulsion 240 mL (07/10/12 1734)  . TPN (CLINIMIX) +/- additives  80 mL/hr at 07/09/12 1708  . TPN (CLINIMIX) +/- additives 80 mL/hr at 07/10/12 1733  . TPN (CLINIMIX) +/- additives     PRN Meds:.acetaminophen, alum & mag hydroxide-simeth, HYDROcodone-acetaminophen, morphine injection, ondansetron, sodium chloride, zolpidem  Labs:  CMP     Component Value Date/Time   NA 133* 07/11/2012 0630   K 4.1 07/11/2012 0630   CL 100 07/11/2012 0630   CO2 23 07/11/2012 0630   GLUCOSE 108* 07/11/2012 0630   BUN 13 07/11/2012 0630   CREATININE 0.67 07/11/2012 0630   CREATININE 0.91 01/04/2012 1230   CALCIUM 8.8 07/11/2012 0630   PROT 7.3 07/10/2012 0610   ALBUMIN 2.6* 07/10/2012 0610   AST 43* 07/10/2012 0610   ALT 84* 07/10/2012 0610   ALKPHOS 244* 07/10/2012 0610   BILITOT 0.4 07/10/2012 0610   GFRNONAA >90 07/11/2012 0630   GFRAA >90 07/11/2012 0630   CBG (last 3)   Basename 07/11/12 1228 07/11/12 0632 07/10/12 2256  GLUCAP 114* 99 117*      Intake/Output Summary (Last 24 hours) at 07/11/12 1539 Last data filed at 07/11/12 0830  Gross per 24 hour  Intake   5534 ml  Output    600 ml  Net   4934 ml   Last BM - 07/10/12  Weight Status:   8/3 140 lb 813 138.5 lb per pt report   Re-estimated needs:   1850-2050 calories  95-110g protein  Nutrition Dx:  Altered GI function - ongoing  Goal:  TNA to meet >90% of estimated nutritional needs  Monitor: Weights, labs, TNA, diet advancement   Dietitian# [phone]

## 2012-07-11 NOTE — Progress Notes (Signed)
Patient ID: Samuel Montgomery, male   DOB: 03-09-1956, 56 y.o.   MRN: [id number]    Subjective: Pt feels frustrated and depressed.  Abdominal pain seems improved.  Tolerating some clear liquids.  Objective: Vital signs in last 24 hours: Temp:  [98.4 F (36.9 C)-98.8 F (37.1 C)] 98.6 F (37 C) (08/13 0630) Pulse Rate:  [56-68] 68  (08/13 0630) Resp:  [16-17] 17  (08/13 0630) BP: (109-118)/(58-66) 118/66 mmHg (08/13 0630) SpO2:  [98 %-100 %] 98 % (08/13 0630) Last BM Date: 07/10/12  Intake/Output from previous day: 08/12 0701 - 08/13 0700 In: 5444 [P.O.:240; I.V.:2604.7; IV Piggyback:150; TPN:2449.3] Out: 600 [Urine:600] Intake/Output this shift:    PE: Abd: soft, less tender in RLQ, ND  Lab Results:   Basename 07/11/12 0630 07/10/12 0610  WBC 16.8* 15.1*  HGB 11.4* 10.9*  HCT 33.1* 33.5*  PLT 462* 426*   BMET  Basename 07/11/12 0630 07/10/12 0610  NA 133* 134*  K 4.1 4.1  CL 100 101  CO2 23 25  GLUCOSE 108* 123*  BUN 13 14  CREATININE 0.67 0.71  CALCIUM 8.8 9.1   PT/INR No results found for this basename: LABPROT:2,INR:2 in the last 72 hours CMP     Component Value Date/Time   NA 133* 07/11/2012 0630   K 4.1 07/11/2012 0630   CL 100 07/11/2012 0630   CO2 23 07/11/2012 0630   GLUCOSE 108* 07/11/2012 0630   BUN 13 07/11/2012 0630   CREATININE 0.67 07/11/2012 0630   CREATININE 0.91 01/04/2012 1230   CALCIUM 8.8 07/11/2012 0630   PROT 7.3 07/10/2012 0610   ALBUMIN 2.6* 07/10/2012 0610   AST 43* 07/10/2012 0610   ALT 84* 07/10/2012 0610   ALKPHOS 244* 07/10/2012 0610   BILITOT 0.4 07/10/2012 0610   GFRNONAA >90 07/11/2012 0630   GFRAA >90 07/11/2012 0630   Lipase     Component Value Date/Time   LIPASE 43 07/01/2012 2115       Studies/Results: No results found.  Anti-infectives: Anti-infectives     Start     Dose/Rate Route Frequency Ordered Stop   07/09/12 1000   ertapenem (INVANZ) 1 g in sodium chloride 0.9 % 50 mL IVPB        1 g 100 mL/hr over 30 Minutes  Intravenous Every 24 hours 07/09/12 0949     07/09/12 1000   fluconazole (DIFLUCAN) IVPB 100 mg        100 mg 50 mL/hr over 60 Minutes Intravenous Every 24 hours 07/09/12 0949     07/02/12 0500   piperacillin-tazobactam (ZOSYN) IVPB 3.375 g  Status:  Discontinued        3.375 g 12.5 mL/hr over 240 Minutes Intravenous Every 8 hours 07/02/12 0412 07/09/12 0948           Assessment/Plan  1. Phlegmon of RLQ, s/p ex lap with ileocecectomy for crohn's disease  Plan: 1. The patient's clinical symptoms seem to be improving, despite his WBC remaining stable.   2. Cont with IV abx therapy and bowel rest. 3. Repeat CT scan around Thursday.  If patient requires another operation, he requests transfer to [facility].   LOS: 10 days    Mykaylah Ballman E 07/11/2012

## 2012-07-11 NOTE — Progress Notes (Signed)
PARENTERAL NUTRITION CONSULT NOTE - FOLLOW UP  Pharmacy Consult for TNA Indication: SB perforation  Patient Measurements: Height: 5\' 4"  (162.6 cm) Weight: 140 lb 11.2 oz (63.821 kg) IBW/kg (Calculated) : 59.2  Usual Weight: 64 kg  Vital Signs: Temp: 98.8 F (37.1 C) (08/12 2045) Temp src: Oral (08/12 2045) BP: 118/64 mmHg (08/12 2045) Pulse Rate: 56  (08/12 2045) Intake/Output from previous day: 08/12 0701 - 08/13 0700 In: 5444 [P.O.:240; I.V.:2604.7; IV Piggyback:150; TPN:2449.3] Out: -   Labs:  Basename 07/11/12 0630 07/10/12 0610 07/09/12 0557  WBC 16.8* 15.1* 15.7*  HGB 11.4* 10.9* 11.2*  HCT 33.1* 33.5* 33.0*  PLT 462* 426* 376  APTT -- -- --  INR -- -- --     Basename 07/11/12 0630 07/10/12 0610  NA 133* 134*  K 4.1 4.1  CL 100 101  CO2 23 25  GLUCOSE 108* 123*  BUN 13 14  CREATININE 0.67 0.71  LABCREA -- --  CREAT24HRUR -- --  CALCIUM 8.8 9.1  MG -- 2.3  PHOS -- 3.4  PROT -- 7.3  ALBUMIN -- 2.6*  AST -- 43*  ALT -- 84*  ALKPHOS -- 244*  BILITOT -- 0.4  BILIDIR -- --  IBILI -- --  PREALBUMIN -- 14.6*  TRIG -- 102  CHOLHDL -- --  CHOL -- 93   Estimated Creatinine Clearance: 85.3 ml/min (by C-G formula based on Cr of 0.67).    Basename 07/11/12 0632 07/10/12 2256 07/10/12 1752  GLUCAP 99 117* 117*    Medications:  Infusions:     . dextrose 5 % and 0.45 % NaCl with KCl 20 mEq/L 40 mL/hr at 07/11/12 0634  . fat emulsion 240 mL (07/10/12 1734)  . TPN (CLINIMIX) +/- additives 80 mL/hr at 07/09/12 1708  . TPN (CLINIMIX) +/- additives 80 mL/hr at 07/10/12 1733    Nutritional Goals:   RD recs  Kcal: 1850-2050, Protein: 75-90 gm  Clinimix 5/20 at goal rate of 80 ml/hr + Lipids MWF to provide 96 g protein/day and an average of  1896 Kcal/day   Current nutrition:   TPN at goal rate  Diet: CL  IVF: D51/2NS with 20 KCL @ 40 ml/hr  CBGs & Insulin requirements past 24 hours:  CBG's 121, 117, 117, 99;  1 units SSI (sensitive q6h) No Hx  DM  Assessment:   Previous small bowel perforation, expl lap 6/16 with ileocecectomy, abscess drainage. MRI 8/2 showed a peri-rectal abscess.  TPN started 07/02/12, increased to goal rate on 8/7. CT per MD notes shows no change but no worsening  Clear liquid diet started 8/9, but pt did not tolerate well and was changed back to NPO on 8/10. Re-started clear liquid diet 8/12.  Electrolytes: Na low, cannot be adjusted in TNA; Other lytes WNL (8/8)  LFTs slightly elevated prior to TNA, but stable, will continue to monitor  Pre-Albumin: 8.9 (8/5), 14.6 (8/12)  TG/Cholesterol: wnl (8/5 and 8/12)  Plan:   Continue Clinimix E 5/20 at 80 ml/hr Fat emulsion at 10 ml/hr (MWF only due to ongoing shortage). Continue PO multivitamin daily  Continue IVF at 40 ml/hr. Continue SSI TNA lab panels on Mondays & Thursdays. F/u daily.  Tammy Sours, Pharm.D. Clinical Oncology Pharmacist  Pager # [phone]  07/11/2012 7:27 AM

## 2012-07-12 ENCOUNTER — Inpatient Hospital Stay (HOSPITAL_COMMUNITY): Payer: BC Managed Care – PPO

## 2012-07-12 LAB — GLUCOSE, CAPILLARY: Glucose-Capillary: 116 mg/dL — ABNORMAL HIGH (ref 70–99)

## 2012-07-12 LAB — CBC
HCT: 34.5 % — ABNORMAL LOW (ref 39.0–52.0)
MCH: 28.5 pg (ref 26.0–34.0)
MCHC: 33.3 g/dL (ref 30.0–36.0)
RDW: 14.1 % (ref 11.5–15.5)

## 2012-07-12 MED ORDER — ZINC TRACE METAL 1 MG/ML IV SOLN
INTRAVENOUS | Status: AC
  Administered 2012-07-12: 18:00:00 via INTRAVENOUS
  Filled 2012-07-12: qty 2000

## 2012-07-12 MED ORDER — IOHEXOL 300 MG/ML  SOLN
100.0000 mL | Freq: Once | INTRAMUSCULAR | Status: AC | PRN
  Administered 2012-07-12: 100 mL via INTRAVENOUS

## 2012-07-12 MED ORDER — PANTOPRAZOLE SODIUM 40 MG PO TBEC: 40.0000 mg | DELAYED_RELEASE_TABLET | Freq: Every day | ORAL | Status: DC

## 2012-07-12 MED ORDER — PANTOPRAZOLE SODIUM 40 MG PO TBEC
40.0000 mg | DELAYED_RELEASE_TABLET | Freq: Every day | ORAL | Status: DC
  Administered 2012-07-12 – 2012-07-19 (×8): 40 mg via ORAL
  Filled 2012-07-12 (×7): qty 1

## 2012-07-12 MED ORDER — RESOURCE INSTANT PROTEIN PO PWD PACKET
1.0000 | Freq: Every day | ORAL | Status: DC
  Administered 2012-07-12: 6 g via ORAL
  Filled 2012-07-12: qty 227

## 2012-07-12 MED ORDER — FAT EMULSION 20 % IV EMUL
240.0000 mL | INTRAVENOUS | Status: AC
  Administered 2012-07-12: 240 mL via INTRAVENOUS
  Filled 2012-07-12: qty 250

## 2012-07-12 NOTE — Progress Notes (Signed)
PARENTERAL NUTRITION CONSULT NOTE - FOLLOW UP  Pharmacy Consult for TNA Indication: SB perforation  Patient Measurements: Height: 5\' 4"  (162.6 cm) Weight: 140 lb 11.2 oz (63.821 kg) IBW/kg (Calculated) : 59.2  Usual Weight: 64 kg  Vital Signs: Temp: 98.4 F (36.9 C) (08/14 0620) Temp src: Oral (08/14 0620) BP: 116/70 mmHg (08/14 0620) Pulse Rate: 70  (08/14 0620) Intake/Output from previous day: 08/13 0701 - 08/14 0700 In: 2735.1 [P.O.:840; I.V.:564.8; IV Piggyback:100; TPN:1230.3] Out: -   Labs:  Basename 07/12/12 0615 07/11/12 0630 07/10/12 0610  WBC 20.0* 16.8* 15.1*  HGB 11.5* 11.4* 10.9*  HCT 34.5* 33.1* 33.5*  PLT 508* 462* 426*  APTT -- -- --  INR -- -- --     Basename 07/11/12 0630 07/10/12 0610  NA 133* 134*  K 4.1 4.1  CL 100 101  CO2 23 25  GLUCOSE 108* 123*  BUN 13 14  CREATININE 0.67 0.71  LABCREA -- --  CREAT24HRUR -- --  CALCIUM 8.8 9.1  MG -- 2.3  PHOS -- 3.4  PROT -- 7.3  ALBUMIN -- 2.6*  AST -- 43*  ALT -- 84*  ALKPHOS -- 244*  BILITOT -- 0.4  BILIDIR -- --  IBILI -- --  PREALBUMIN -- 14.6*  TRIG -- 102  CHOLHDL -- --  CHOL -- 93   Estimated Creatinine Clearance: 85.3 ml/min (by C-G formula based on Cr of 0.67).    Basename 07/11/12 2309 07/11/12 1711 07/11/12 1228  GLUCAP 112* 99 114*    Medications:  Infusions:     . dextrose 5 % and 0.45 % NaCl with KCl 20 mEq/L 40 mL/hr at 07/11/12 0634  . fat emulsion 240 mL (07/10/12 1734)  . TPN (CLINIMIX) +/- additives 80 mL/hr at 07/10/12 1733  . TPN (CLINIMIX) +/- additives 80 mL/hr at 07/11/12 1739    Nutritional Goals:   New RD recs as of 8/13: Kcal: 1850-2050, Protein: 95-110 gm  Clinimix 5/20 at goal rate of 80 ml/hr + Lipids MWF to provide 96 g protein/day and an average of  1896 Kcal/day   Current nutrition:   TPN at goal rate  Diet: CL  IVF: D51/2NS with 20 KCL @ 40 ml/hr  CBGs & Insulin requirements past 24 hours:  CBG's stable. No Hx DM  Assessment:    Previous small bowel perforation, expl lap 6/16 with ileocecectomy, abscess drainage. MRI 8/2 showed a peri-rectal abscess.  TPN started 07/02/12, increased to goal rate on 8/7. CT per MD notes shows no change but no worsening  Per RD note, pt doing well with CL diet now  Electrolytes: Na low, cannot be adjusted in TNA; Other lytes WNL (8/13)  LFTs slightly elevated prior to TNA, but stable, will continue to monitor  Pre-Albumin: 8.9 (8/5), 14.6 (8/12)  TG/Cholesterol: wnl (8/5 and 8/12)  Plan:   Continue Clinimix E 5/20 at 80 ml/hr to provide 96 g protein/day. Plan per RD noted and agree with plan to add ProStat for additional protein needs rather than increase TNA rate that would increase protein but also increase amount of fluid that patient would receive. Would try Prostat as first option as a result Fat emulsion at 10 ml/hr (MWF only due to ongoing shortage). Continue PO multivitamin daily (no need for MVI in TNA as a result) Patient to receive trace elements via TNA today (MWF only) Continue IVF at 40 ml/hr. Continue SSI TNA lab panels on Mondays & Thursdays. F/u daily.   Hessie Knows, PharmD,  BCPS Pager [phone] 07/12/2012 7:58 AM

## 2012-07-12 NOTE — Progress Notes (Signed)
Nutrition Brief Note  - Pt did not like Prostat, will not order further. Discussed increased protein needs with pharmacist who expressed concerned over increasing TNA rate r/t increasing pt's fluid intake. He recommended trying Beneprotein instead - will order.   Dietitian# [phone]

## 2012-07-12 NOTE — Progress Notes (Signed)
Patient ID: Samuel Montgomery, male   DOB: 04/19/1956, 56 y.o.   MRN: [id number]    Subjective: Pt feels ok, but states he does feel slightly weaker today and yesterday.  No increase in pain.  Objective: Vital signs in last 24 hours: Temp:  [98.4 F (36.9 C)-98.7 F (37.1 C)] 98.4 F (36.9 C) (08/14 0620) Pulse Rate:  [48-70] 70  (08/14 0620) Resp:  [15-17] 15  (08/14 0620) BP: (116-119)/(63-70) 116/70 mmHg (08/14 0620) SpO2:  [96 %] 96 % (08/14 0620) Last BM Date: 07/12/12  Intake/Output from previous day: 08/13 0701 - 08/14 0700 In: 2735.1 [P.O.:840; I.V.:564.8; IV Piggyback:100; TPN:1230.3] Out: -  Intake/Output this shift:    PE: Abd: soft, mildly tender, +BS, ND HT: reg Lungs: CTAB  Lab Results:   Basename 07/12/12 0615 07/11/12 0630  WBC 20.0* 16.8*  HGB 11.5* 11.4*  HCT 34.5* 33.1*  PLT 508* 462*   BMET  Basename 07/11/12 0630 07/10/12 0610  NA 133* 134*  K 4.1 4.1  CL 100 101  CO2 23 25  GLUCOSE 108* 123*  BUN 13 14  CREATININE 0.67 0.71  CALCIUM 8.8 9.1   PT/INR No results found for this basename: LABPROT:2,INR:2 in the last 72 hours CMP     Component Value Date/Time   NA 133* 07/11/2012 0630   K 4.1 07/11/2012 0630   CL 100 07/11/2012 0630   CO2 23 07/11/2012 0630   GLUCOSE 108* 07/11/2012 0630   BUN 13 07/11/2012 0630   CREATININE 0.67 07/11/2012 0630   CREATININE 0.91 01/04/2012 1230   CALCIUM 8.8 07/11/2012 0630   PROT 7.3 07/10/2012 0610   ALBUMIN 2.6* 07/10/2012 0610   AST 43* 07/10/2012 0610   ALT 84* 07/10/2012 0610   ALKPHOS 244* 07/10/2012 0610   BILITOT 0.4 07/10/2012 0610   GFRNONAA >90 07/11/2012 0630   GFRAA >90 07/11/2012 0630   Lipase     Component Value Date/Time   LIPASE 43 07/01/2012 2115       Studies/Results: No results found.  Anti-infectives: Anti-infectives     Start     Dose/Rate Route Frequency Ordered Stop   07/09/12 1000   ertapenem (INVANZ) 1 g in sodium chloride 0.9 % 50 mL IVPB        1 g 100 mL/hr over 30 Minutes  Intravenous Every 24 hours 07/09/12 0949     07/09/12 1000   fluconazole (DIFLUCAN) IVPB 100 mg        100 mg 50 mL/hr over 60 Minutes Intravenous Every 24 hours 07/09/12 0949     07/02/12 0500   piperacillin-tazobactam (ZOSYN) IVPB 3.375 g  Status:  Discontinued        3.375 g 12.5 mL/hr over 240 Minutes Intravenous Every 8 hours 07/02/12 0412 07/09/12 0948           Assessment/Plan  1. RLQ inflammatory process, s/p ileocecectomy for crohn's disease 2. Increasing leukocytosis   Plan: 1. Will go ahead and proceed with CT today instead of tomorrow given increasing WBC.  I d/w the patient and his mother.  They are agreeable.   2. Cont abx therapy 3. Cont just clears for now.    LOS: 11 days    Siddhanth Denk E 07/12/2012

## 2012-07-12 NOTE — Progress Notes (Signed)
Patient feels no worse, but as noted wbc up. Reviewed CT with radiologist > no drainable abscess, probably somewhat improved, clearly no worse. Discussed with patient and mother. No clear indication to proceed to surgery, but will also get ID consult for suggestions

## 2012-07-13 DIAGNOSIS — K5080 Crohn's disease of both small and large intestine without complications: Secondary | ICD-10-CM

## 2012-07-13 LAB — COMPREHENSIVE METABOLIC PANEL
ALT: 53 U/L (ref 0–53)
AST: 22 U/L (ref 0–37)
Albumin: 2.6 g/dL — ABNORMAL LOW (ref 3.5–5.2)
Alkaline Phosphatase: 211 U/L — ABNORMAL HIGH (ref 39–117)
CO2: 24 mEq/L (ref 19–32)
Chloride: 98 mEq/L (ref 96–112)
Creatinine, Ser: 0.69 mg/dL (ref 0.50–1.35)
GFR calc non Af Amer: >90 mL/min (ref >90)
Potassium: 4.2 mEq/L (ref 3.5–5.1)
Sodium: 133 mEq/L — ABNORMAL LOW (ref 135–145)
Total Bilirubin: 0.2 mg/dL — ABNORMAL LOW (ref 0.3–1.2)

## 2012-07-13 LAB — CBC
HCT: 32.7 % — ABNORMAL LOW (ref 39.0–52.0)
Hemoglobin: 10.7 g/dL — ABNORMAL LOW (ref 13.0–17.0)
MCH: 28.1 pg (ref 26.0–34.0)
MCHC: 32.7 g/dL (ref 30.0–36.0)
RDW: 14.3 % (ref 11.5–15.5)

## 2012-07-13 LAB — GLUCOSE, CAPILLARY: Glucose-Capillary: 89 mg/dL (ref 70–99)

## 2012-07-13 MED ORDER — RESOURCE INSTANT PROTEIN PO PWD PACKET
1.0000 | Freq: Three times a day (TID) | ORAL | Status: DC
  Administered 2012-07-13 – 2012-07-19 (×18): 6 g via ORAL
  Filled 2012-07-13 (×21): qty 6

## 2012-07-13 MED ORDER — FLUCONAZOLE IN SODIUM CHLORIDE 200-0.9 MG/100ML-% IV SOLN
200.0000 mg | INTRAVENOUS | Status: DC
  Administered 2012-07-14 – 2012-07-17 (×4): 200 mg via INTRAVENOUS
  Filled 2012-07-13 (×4): qty 100

## 2012-07-13 MED ORDER — CLINIMIX E/DEXTROSE (5/20) 5 % IV SOLN
INTRAVENOUS | Status: AC
  Administered 2012-07-13: 18:00:00 via INTRAVENOUS
  Filled 2012-07-13: qty 2000

## 2012-07-13 NOTE — Progress Notes (Signed)
Continues to feel better. See note by KO,PA. ID consult being done now.

## 2012-07-13 NOTE — Progress Notes (Signed)
Nutrition Brief Note  - Pt used Beneprotein last night and agrees to Beneprotein TID with meals for additional protein - will order. Each packet provides 6g protein, 25 calories. Pt still getting Clinimix 5/20 at 16ml/hr with 20% fat emulsion at 59ml/hr three times weekly. Will monitor.   Dietitian# [phone]

## 2012-07-13 NOTE — Consult Note (Addendum)
Infectious Diseases Initial Consultation  Reason for Consultation:  leukocytosis   HPI: Samuel Montgomery is a 56 y.o. male with recent diagnosis earlier this year with Crohn's disease and developed significant pain in June 2013 and underwent ex lap.  It was notable for perforation of the terminal ileum and intraabdominal abscess.  This was after initial diagnosis earlier this year with diarrhea.  Initially, he was to start Humira but with the pain the perforation was noted on CT scan and underwent ileocecectomy.  He was discharged and by his account felt he never really recovered.  His incision healed well but started to have some pain and noted fever.  Went to GI and known to have an anal fistula so was sent for MRI with concern for abscess.  MRI did suggest perirectal abscess and started on Flagyl.  He though continued to have pain and fever and came to ED.  He was noted to have leukocytosis though exam did not suggest significant perianal pain or mass.  He was admitted for unknown fever and leukocytosis.  He has had one fever on 8/4 but otherwise has been < 100.  He did have a CT scan on 8/4 then on 8/14 which did show some improvement and continued inflammatory process and possible tiny area suggesting abscess.  Overall since admission, he has felt fatigued but improved.  No fever since 8/4 and no chills.  He has been on zosyn 8/3-8/10 and Invanz and fluconazole since 8/11.  He did have an elevated WBC yesterday to 20,000 but is down to 14,000 today.    Past Medical History  Diagnosis Date  . GERD (gastroesophageal reflux disease)   . Psoriasis   . Benign neoplasm of colon 05/20/2005    adenomatous polyp  . Diverticulosis of colon (without mention of hemorrhage)   . COPD (chronic obstructive pulmonary disease)   . Inguinal hernia     bi-lateral  . 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 ? Perianal fistula vs. Abscess      Allergies:  Allergies  Allergen Reactions  .  Wellbutrin (Bupropion Hcl)     Neg CNS. As of 01/04/2012 patient states he is not allergic to  Wellbutrin.    Current antibiotics:   MEDICATIONS:    . aspirin  81 mg Oral Daily  . enoxaparin  40 mg Subcutaneous Q24H  . ertapenem  1 g Intravenous Q24H  . fluconazole (DIFLUCAN) IV  100 mg Intravenous Q24H  . insulin aspart  0-9 Units Subcutaneous Q6H  . multivitamin with minerals  1 tablet Oral Daily  . pantoprazole  40 mg Oral Q1200  . protein supplement  1 scoop Oral TID WC  . sodium chloride  10-40 mL Intracatheter Q12H  . DISCONTD: pantoprazole  40 mg Oral Q1200  . DISCONTD: protein supplement  1 scoop Oral Q supper    History  Substance Use Topics  . Smoking status: Smoker, Current Status Unknown -- 1.0 packs/day for 40 years    Types: Cigarettes  . Smokeless tobacco: Never Used   Comment: has not had any cigarrettes since before surgery  . Alcohol Use: 5.0 oz/week    10 drink(s) per week     4-5 drinks daily./hasn't drink since surgery    Family History  Problem Relation Age of Onset  . Colon polyps Sister     x 2  . Crohn's disease Sister   . Stroke Father   . Colon cancer Neg Hx   .  Hyperlipidemia Mother   . GER disease Mother     Review of Systems - Negative except as per the HPI  OBJECTIVE: Temp:  [98.1 F (36.7 C)-98.6 F (37 C)] 98.4 F (36.9 C) (08/15 0600) Pulse Rate:  [58-66] 66  (08/15 0600) Resp:  [16-18] 17  (08/15 0600) BP: (107-113)/(63-70) 113/67 mmHg (08/15 0600) SpO2:  [98 %-100 %] 98 % (08/15 0600) General appearance: alert, appears stated age and no distress Resp: clear to auscultation bilaterally Cardio: regular rate and rhythm, S1, S2 normal, no murmur, click, rub or gallop GI: soft, +bs, no guarding, no rebound, minimal tenderness in mid abdomen Extremities: extremities normal, atraumatic, no cyanosis or edema  LABS: Results for orders placed during the hospital encounter of 07/01/12 (from the past 48 hour(s))  GLUCOSE,  CAPILLARY     Status: Normal   Collection Time   07/11/12  5:11 PM      Component Value Range Comment   Glucose-Capillary 99  70 - 99 mg/dL   GLUCOSE, CAPILLARY     Status: Abnormal   Collection Time   07/11/12 11:09 PM      Component Value Range Comment   Glucose-Capillary 112 (*) 70 - 99 mg/dL    Comment 1 Notify RN     CBC     Status: Abnormal   Collection Time   07/12/12  6:15 AM      Component Value Range Comment   WBC 20.0 (*) 4.0 - 10.5 K/uL    RBC 4.03 (*) 4.22 - 5.81 MIL/uL    Hemoglobin 11.5 (*) 13.0 - 17.0 g/dL    HCT 45.4 (*) 09.8 - 52.0 %    MCV 85.6  78.0 - 100.0 fL    MCH 28.5  26.0 - 34.0 pg    MCHC 33.3  30.0 - 36.0 g/dL    RDW 11.9  14.7 - 82.9 %    Platelets 508 (*) 150 - 400 K/uL   GLUCOSE, CAPILLARY     Status: Abnormal   Collection Time   07/12/12  6:26 AM      Component Value Range Comment   Glucose-Capillary 116 (*) 70 - 99 mg/dL    Comment 1 Notify RN     GLUCOSE, CAPILLARY     Status: Abnormal   Collection Time   07/12/12 12:15 PM      Component Value Range Comment   Glucose-Capillary 116 (*) 70 - 99 mg/dL   GLUCOSE, CAPILLARY     Status: Abnormal   Collection Time   07/12/12  5:05 PM      Component Value Range Comment   Glucose-Capillary 101 (*) 70 - 99 mg/dL   GLUCOSE, CAPILLARY     Status: Normal   Collection Time   07/12/12 11:07 PM      Component Value Range Comment   Glucose-Capillary 99  70 - 99 mg/dL   COMPREHENSIVE METABOLIC PANEL     Status: Abnormal   Collection Time   07/13/12  6:05 AM      Component Value Range Comment   Sodium 133 (*) 135 - 145 mEq/L    Potassium 4.2  3.5 - 5.1 mEq/L    Chloride 98  96 - 112 mEq/L    CO2 24  19 - 32 mEq/L    Glucose, Bld 107 (*) 70 - 99 mg/dL    BUN 13  6 - 23 mg/dL    Creatinine, Ser 5.62  0.50 - 1.35 mg/dL    Calcium 9.0  8.4 - 10.5 mg/dL    Total Protein 7.2  6.0 - 8.3 g/dL    Albumin 2.6 (*) 3.5 - 5.2 g/dL    AST 22  0 - 37 U/L    ALT 53  0 - 53 U/L    Alkaline Phosphatase 211 (*) 39 -  117 U/L    Total Bilirubin 0.2 (*) 0.3 - 1.2 mg/dL    GFR calc non Af Amer >90  >90 mL/min    GFR calc Af Amer >90  >90 mL/min   MAGNESIUM     Status: Normal   Collection Time   07/13/12  6:05 AM      Component Value Range Comment   Magnesium 2.1  1.5 - 2.5 mg/dL   PHOSPHORUS     Status: Normal   Collection Time   07/13/12  6:05 AM      Component Value Range Comment   Phosphorus 4.2  2.3 - 4.6 mg/dL   CBC     Status: Abnormal   Collection Time   07/13/12  6:05 AM      Component Value Range Comment   WBC 14.8 (*) 4.0 - 10.5 K/uL    RBC 3.81 (*) 4.22 - 5.81 MIL/uL    Hemoglobin 10.7 (*) 13.0 - 17.0 g/dL    HCT 14.7 (*) 82.9 - 52.0 %    MCV 85.8  78.0 - 100.0 fL    MCH 28.1  26.0 - 34.0 pg    MCHC 32.7  30.0 - 36.0 g/dL    RDW 56.2  13.0 - 86.5 %    Platelets 459 (*) 150 - 400 K/uL   GLUCOSE, CAPILLARY     Status: Abnormal   Collection Time   07/13/12  6:10 AM      Component Value Range Comment   Glucose-Capillary 103 (*) 70 - 99 mg/dL    Comment 1 Notify RN     GLUCOSE, CAPILLARY     Status: Normal   Collection Time   07/13/12 12:18 PM      Component Value Range Comment   Glucose-Capillary 89  70 - 99 mg/dL     MICRO:  IMAGING: Ct Abdomen Pelvis W Contrast  07/12/2012  *RADIOLOGY REPORT*  Clinical Data: Follow-up inflammatory changes in the abdomen. History of Crohn's disease and ileocecectomy.  CT ABDOMEN AND PELVIS WITH CONTRAST  Technique:  Multidetector CT imaging of the abdomen and pelvis was performed following the standard protocol during bolus administration of intravenous contrast.  Contrast: OMNIPAQUE IOHEXOL 300 MG/ML  SOLN  Comparison: 07/07/2012 and 07/02/2012.  Findings: Lung bases show no acute findings.  Retrocrural lymph node measures 8 mm.  Heart size normal.  No pericardial or pleural effusion.  Liver, gallbladder, adrenal glands, kidneys, spleen, pancreas and stomach are unremarkable.  Inflammatory thickening, fluid and stranding are again seen in the  right lower quadrant, with postoperative changes of ileocecectomy.  There appears to be eccentric wall thickening involving the proximal colon (image 43), unchanged. There may be a tiny focal fluid collection in the adjacent mesentery, measuring 1.3 cm.  Previously, a locule of air was seen within, on 07/07/2012. Adjacent adenopathy measures 1.2 cm, stable.  There are scattered sub centimeter lymph nodes in the small bowel mesentery.  Retroperitoneal lymph nodes measure up to 10 mm in the left periaortic station, as before.  There is thickening of the anterior wall of the bladder, which is likely due to under distention, given a normal appearance on 07/07/2012.  Small bilateral inguinal hernias contain fat.  Left external iliac lymph node measures 10 mm, stable.  No free fluid. No worrisome lytic or sclerotic lesions.  IMPRESSION:  1.  Stable inflammatory process in the right lower quadrant, with apparent eccentric wall thickening involving the proximal colon, and postoperative changes of ileocecectomy. 2.  Possible tiny localized abscess in the adjacent small bowel mesentery, with reactive adenopathy, stable.  Original Report Authenticated By: Reyes Ivan, M.D.    HISTORICAL MICRO/IMAGING  Assessment/Plan:    1) leukocytosis and post op fever - his fever has resolved, though WBC remains elevated.  I do not appreciate any other source outside of his abdomen.  I think his current antibiotics including Invanz and fluconazole are appropriate.  Other possibilities do include Enterococcus but with slow improvement, I recommend continuing with the same.   -will check blood cultures and with fever Will continue to follow.    Thanks for the consult.

## 2012-07-13 NOTE — Progress Notes (Signed)
PARENTERAL NUTRITION CONSULT NOTE - FOLLOW UP  Pharmacy Consult for TNA Indication: SB perforation  Patient Measurements: Height: 5\' 4"  (162.6 cm) Weight: 140 lb 11.2 oz (63.821 kg) IBW/kg (Calculated) : 59.2  Usual Weight: 64 kg  Vital Signs: Temp: 98.4 F (36.9 C) (08/15 0600) Temp src: Oral (08/15 0600) BP: 113/67 mmHg (08/15 0600) Pulse Rate: 66  (08/15 0600) Intake/Output from previous day: 08/14 0701 - 08/15 0700 In: 3825.8 [P.O.:870; I.V.:942.3; TPN:2013.5] Out: -   Labs:  Basename 07/13/12 0605 07/12/12 0615 07/11/12 0630  WBC 14.8* 20.0* 16.8*  HGB 10.7* 11.5* 11.4*  HCT 32.7* 34.5* 33.1*  PLT 459* 508* 462*  APTT -- -- --  INR -- -- --     Basename 07/13/12 0605 07/11/12 0630  NA 133* 133*  K 4.2 4.1  CL 98 100  CO2 24 23  GLUCOSE 107* 108*  BUN 13 13  CREATININE 0.69 0.67  LABCREA -- --  CREAT24HRUR -- --  CALCIUM 9.0 8.8  MG 2.1 --  PHOS 4.2 --  PROT 7.2 --  ALBUMIN 2.6* --  AST 22 --  ALT 53 --  ALKPHOS 211* --  BILITOT 0.2* --  BILIDIR -- --  IBILI -- --  PREALBUMIN -- --  TRIG -- --  CHOLHDL -- --  CHOL -- --   Estimated Creatinine Clearance: 85.3 ml/min (by C-G formula based on Cr of 0.69).    Basename 07/13/12 0610 07/12/12 2307 07/12/12 1705  GLUCAP 103* 99 101*    Medications:  Infusions:     . dextrose 5 % and 0.45 % NaCl with KCl 20 mEq/L 1,000 mL (07/12/12 0715)  . TPN (CLINIMIX) +/- additives 80 mL/hr at 07/12/12 1745   And  . fat emulsion 240 mL (07/12/12 1745)  . TPN (CLINIMIX) +/- additives 80 mL/hr at 07/11/12 1739    Nutritional Goals:   New RD recs as of 8/13: Kcal: 1850-2050, Protein: 95-110 gm  Clinimix 5/20 at goal rate of 80 ml/hr + Lipids MWF to provide 96 g protein/day and an average of  1896 Kcal/day   Patient consents to using Beneprotein at 6gm tid = 18 gm daily.  Current nutrition:   TPN at goal rate  Diet: CL  IVF: D51/2NS with 20 KCL @ 40 ml/hr  CBGs & Insulin requirements past 24  hours:  CBG's stable. No Hx DM  Assessment:   Previous small bowel perforation, expl lap 6/16 with ileocecectomy, abscess drainage. MRI 8/2 showed a peri-rectal abscess.  TPN started 07/02/12, increased to goal rate on 8/7. CT per MD notes shows no change but no worsening  Per RD note, pt doing well with CL diet now  Electrolytes: Na low, cannot be adjusted in TNA; Other lytes WNL (8/13)  LFTs slightly elevated prior to TNA, but stable, will continue to monitor  Pre-Albumin: 8.9 (8/5), 14.6 (8/12)  TG/Cholesterol: wnl (8/5 and 8/12)  Plan:   Continue Clinimix E 5/20 at 80 ml/hr to provide 96 g protein/day. Plan per RD noted and plan to Beneprotein for additional protein needs rather than increase TNA rate that would increase protein but also increase amount of fluid that patient would receive.  Have requested urine output measurement from today. Fat emulsion at 10 ml/hr (MWF only due to ongoing shortage). Continue PO multivitamin daily (no need for MVI in TNA as a result) Patient to receive trace elements via TNA today (MWF only) Continue IVF at 40 ml/hr. Continue SSI TNA lab panels on Mondays &  Thursdays. F/u daily.   Otho Bellows  PharmD,  Pager [phone] 07/13/2012 11:16 AM

## 2012-07-13 NOTE — Progress Notes (Signed)
Patient ID: Samuel Montgomery, male   DOB: 15-Oct-1956, 56 y.o.   MRN: [id number]    Subjective: Pt feels well today.  Wanting to see if he can shower and eat cream of potato soup.  Objective: Vital signs in last 24 hours: Temp:  [98.1 F (36.7 C)-98.6 F (37 C)] 98.4 F (36.9 C) (08/15 0600) Pulse Rate:  [58-66] 66  (08/15 0600) Resp:  [16-18] 17  (08/15 0600) BP: (107-113)/(63-70) 113/67 mmHg (08/15 0600) SpO2:  [98 %-100 %] 98 % (08/15 0600) Last BM Date: 07/13/12  Intake/Output from previous day: 08/14 0701 - 08/15 0700 In: 3825.8 [P.O.:870; I.V.:942.3; TPN:2013.5] Out: -  Intake/Output this shift: Total I/O In: 240 [P.O.:240] Out: 675 [Urine:600; Stool:75]  PE: Abd: soft, minimally tender, ND, +BS  Lab Results:   Basename 07/13/12 0605 07/12/12 0615  WBC 14.8* 20.0*  HGB 10.7* 11.5*  HCT 32.7* 34.5*  PLT 459* 508*   BMET  Basename 07/13/12 0605 07/11/12 0630  NA 133* 133*  K 4.2 4.1  CL 98 100  CO2 24 23  GLUCOSE 107* 108*  BUN 13 13  CREATININE 0.69 0.67  CALCIUM 9.0 8.8   PT/INR No results found for this basename: LABPROT:2,INR:2 in the last 72 hours CMP     Component Value Date/Time   NA 133* 07/13/2012 0605   K 4.2 07/13/2012 0605   CL 98 07/13/2012 0605   CO2 24 07/13/2012 0605   GLUCOSE 107* 07/13/2012 0605   BUN 13 07/13/2012 0605   CREATININE 0.69 07/13/2012 0605   CREATININE 0.91 01/04/2012 1230   CALCIUM 9.0 07/13/2012 0605   PROT 7.2 07/13/2012 0605   ALBUMIN 2.6* 07/13/2012 0605   AST 22 07/13/2012 0605   ALT 53 07/13/2012 0605   ALKPHOS 211* 07/13/2012 0605   BILITOT 0.2* 07/13/2012 0605   GFRNONAA >90 07/13/2012 0605   GFRAA >90 07/13/2012 0605   Lipase     Component Value Date/Time   LIPASE 43 07/01/2012 2115       Studies/Results: Ct Abdomen Pelvis W Contrast  07/12/2012  *RADIOLOGY REPORT*  Clinical Data: Follow-up inflammatory changes in the abdomen. History of Crohn's disease and ileocecectomy.  CT ABDOMEN AND PELVIS WITH CONTRAST   Technique:  Multidetector CT imaging of the abdomen and pelvis was performed following the standard protocol during bolus administration of intravenous contrast.  Contrast: OMNIPAQUE IOHEXOL 300 MG/ML  SOLN  Comparison: 07/07/2012 and 07/02/2012.  Findings: Lung bases show no acute findings.  Retrocrural lymph node measures 8 mm.  Heart size normal.  No pericardial or pleural effusion.  Liver, gallbladder, adrenal glands, kidneys, spleen, pancreas and stomach are unremarkable.  Inflammatory thickening, fluid and stranding are again seen in the right lower quadrant, with postoperative changes of ileocecectomy.  There appears to be eccentric wall thickening involving the proximal colon (image 43), unchanged. There may be a tiny focal fluid collection in the adjacent mesentery, measuring 1.3 cm.  Previously, a locule of air was seen within, on 07/07/2012. Adjacent adenopathy measures 1.2 cm, stable.  There are scattered sub centimeter lymph nodes in the small bowel mesentery.  Retroperitoneal lymph nodes measure up to 10 mm in the left periaortic station, as before.  There is thickening of the anterior wall of the bladder, which is likely due to under distention, given a normal appearance on 07/07/2012.  Small bilateral inguinal hernias contain fat.  Left external iliac lymph node measures 10 mm, stable.  No free fluid. No worrisome lytic or sclerotic  lesions.  IMPRESSION:  1.  Stable inflammatory process in the right lower quadrant, with apparent eccentric wall thickening involving the proximal colon, and postoperative changes of ileocecectomy. 2.  Possible tiny localized abscess in the adjacent small bowel mesentery, with reactive adenopathy, stable.  Original Report Authenticated By: Reyes Ivan, M.D.    Anti-infectives: Anti-infectives     Start     Dose/Rate Route Frequency Ordered Stop   07/09/12 1000   ertapenem (INVANZ) 1 g in sodium chloride 0.9 % 50 mL IVPB        1 g 100 mL/hr over 30  Minutes Intravenous Every 24 hours 07/09/12 0949     07/09/12 1000   fluconazole (DIFLUCAN) IVPB 100 mg        100 mg 50 mL/hr over 60 Minutes Intravenous Every 24 hours 07/09/12 0949     07/02/12 0500   piperacillin-tazobactam (ZOSYN) IVPB 3.375 g  Status:  Discontinued        3.375 g 12.5 mL/hr over 240 Minutes Intravenous Every 8 hours 07/02/12 0412 07/09/12 0948           Assessment/Plan  1. RLQ inflammatory changes s/p ileocecectomy 2. Leukocytosis, improving today 3. PCM/TNA  Plan: 1. Unsure what increase in WBC was from over last several days as his CT scan remains stable to slightly improved.  There is no evidence of a leak at his anastomosis.   2. Will let him try some full liquids and continue TNA for now and abx therapy.  3. We have requested an ID consult given his WBC trending upwards yesterday.  It is down today, but would still like an opinion.  LOS: 12 days    Detria Cummings E 07/13/2012

## 2012-07-14 DIAGNOSIS — K5080 Crohn's disease of both small and large intestine without complications: Secondary | ICD-10-CM

## 2012-07-14 LAB — GLUCOSE, CAPILLARY
Glucose-Capillary: 115 mg/dL — ABNORMAL HIGH (ref 70–99)
Glucose-Capillary: 98 mg/dL (ref 70–99)
Glucose-Capillary: 106 mg/dL — ABNORMAL HIGH (ref 70–99)
Glucose-Capillary: 116 mg/dL — ABNORMAL HIGH (ref 70–99)

## 2012-07-14 LAB — CBC
HCT: 31.7 % — ABNORMAL LOW (ref 39.0–52.0)
Hemoglobin: 10.6 g/dL — ABNORMAL LOW (ref 13.0–17.0)
MCV: 85.7 fL (ref 78.0–100.0)
Platelets: 420 10*3/uL — ABNORMAL HIGH (ref 150–400)
RBC: 3.7 MIL/uL — ABNORMAL LOW (ref 4.22–5.81)
WBC: 15.2 10*3/uL — ABNORMAL HIGH (ref 4.0–10.5)

## 2012-07-14 MED ORDER — FAT EMULSION 20 % IV EMUL
240.0000 mL | INTRAVENOUS | Status: AC
  Administered 2012-07-14: 240 mL via INTRAVENOUS
  Filled 2012-07-14: qty 250

## 2012-07-14 MED ORDER — ZINC TRACE METAL 1 MG/ML IV SOLN
INTRAVENOUS | Status: AC
  Administered 2012-07-14: 17:00:00 via INTRAVENOUS
  Filled 2012-07-14: qty 2000

## 2012-07-14 NOTE — Progress Notes (Signed)
Patient ID: Samuel Montgomery, male   DOB: 1956/03/23, 56 y.o.   MRN: [id number]    Subjective: Pt feels the same.  Really no pain.    Objective: Vital signs in last 24 hours: Temp:  [98.3 F (36.8 C)-98.6 F (37 C)] 98.3 F (36.8 C) (08/16 0605) Pulse Rate:  [62-77] 68  (08/16 0605) Resp:  [17-18] 17  (08/16 0605) BP: (109-120)/(63-75) 112/63 mmHg (08/16 0605) SpO2:  [97 %-99 %] 97 % (08/16 0605) Last BM Date: 07/14/12  Intake/Output from previous day: 08/15 0701 - 08/16 0700 In: 3431.5 [P.O.:420; I.V.:968.4; TPN:2043.1] Out: 3475 [Urine:3225; Stool:250] Intake/Output this shift: Total I/O In: 120 [P.O.:120] Out: 575 [Urine:475; Stool:100]  PE: Abd: soft, essentially NT, ND, +BS  Lab Results:   Basename 07/14/12 0500 07/13/12 0605  WBC 15.2* 14.8*  HGB 10.6* 10.7*  HCT 31.7* 32.7*  PLT 420* 459*   BMET  Basename 07/13/12 0605  NA 133*  K 4.2  CL 98  CO2 24  GLUCOSE 107*  BUN 13  CREATININE 0.69  CALCIUM 9.0   PT/INR No results found for this basename: LABPROT:2,INR:2 in the last 72 hours CMP     Component Value Date/Time   NA 133* 07/13/2012 0605   K 4.2 07/13/2012 0605   CL 98 07/13/2012 0605   CO2 24 07/13/2012 0605   GLUCOSE 107* 07/13/2012 0605   BUN 13 07/13/2012 0605   CREATININE 0.69 07/13/2012 0605   CREATININE 0.91 01/04/2012 1230   CALCIUM 9.0 07/13/2012 0605   PROT 7.2 07/13/2012 0605   ALBUMIN 2.6* 07/13/2012 0605   AST 22 07/13/2012 0605   ALT 53 07/13/2012 0605   ALKPHOS 211* 07/13/2012 0605   BILITOT 0.2* 07/13/2012 0605   GFRNONAA >90 07/13/2012 0605   GFRAA >90 07/13/2012 0605   Lipase     Component Value Date/Time   LIPASE 43 07/01/2012 2115       Studies/Results: Ct Abdomen Pelvis W Contrast  07/12/2012  *RADIOLOGY REPORT*  Clinical Data: Follow-up inflammatory changes in the abdomen. History of Crohn's disease and ileocecectomy.  CT ABDOMEN AND PELVIS WITH CONTRAST  Technique:  Multidetector CT imaging of the abdomen and pelvis was  performed following the standard protocol during bolus administration of intravenous contrast.  Contrast: OMNIPAQUE IOHEXOL 300 MG/ML  SOLN  Comparison: 07/07/2012 and 07/02/2012.  Findings: Lung bases show no acute findings.  Retrocrural lymph node measures 8 mm.  Heart size normal.  No pericardial or pleural effusion.  Liver, gallbladder, adrenal glands, kidneys, spleen, pancreas and stomach are unremarkable.  Inflammatory thickening, fluid and stranding are again seen in the right lower quadrant, with postoperative changes of ileocecectomy.  There appears to be eccentric wall thickening involving the proximal colon (image 43), unchanged. There may be a tiny focal fluid collection in the adjacent mesentery, measuring 1.3 cm.  Previously, a locule of air was seen within, on 07/07/2012. Adjacent adenopathy measures 1.2 cm, stable.  There are scattered sub centimeter lymph nodes in the small bowel mesentery.  Retroperitoneal lymph nodes measure up to 10 mm in the left periaortic station, as before.  There is thickening of the anterior wall of the bladder, which is likely due to under distention, given a normal appearance on 07/07/2012.  Small bilateral inguinal hernias contain fat.  Left external iliac lymph node measures 10 mm, stable.  No free fluid. No worrisome lytic or sclerotic lesions.  IMPRESSION:  1.  Stable inflammatory process in the right lower quadrant, with apparent eccentric  wall thickening involving the proximal colon, and postoperative changes of ileocecectomy. 2.  Possible tiny localized abscess in the adjacent small bowel mesentery, with reactive adenopathy, stable.  Original Report Authenticated By: Reyes Ivan, M.D.    Anti-infectives: Anti-infectives     Start     Dose/Rate Route Frequency Ordered Stop   07/14/12 1000   fluconazole (DIFLUCAN) IVPB 200 mg        200 mg 100 mL/hr over 60 Minutes Intravenous Every 24 hours 07/13/12 1444     07/09/12 1000   ertapenem (INVANZ) 1  g in sodium chloride 0.9 % 50 mL IVPB        1 g 100 mL/hr over 30 Minutes Intravenous Every 24 hours 07/09/12 0949     07/09/12 1000   fluconazole (DIFLUCAN) IVPB 100 mg  Status:  Discontinued        100 mg 50 mL/hr over 60 Minutes Intravenous Every 24 hours 07/09/12 0949 07/13/12 1444   07/02/12 0500   piperacillin-tazobactam (ZOSYN) IVPB 3.375 g  Status:  Discontinued        3.375 g 12.5 mL/hr over 240 Minutes Intravenous Every 8 hours 07/02/12 0412 07/09/12 0948           Assessment/Plan  1. Inflammatory changes in RLQ, s/p ileocecectomy 2. Crohn's disease 3. PCM/TNA  Plan: 1. No current changes 2. Appreciate ID's input, await blood cultures 3.  Cont TNA, and abx therapy     LOS: 13 days    [name] E 07/14/2012

## 2012-07-14 NOTE — Progress Notes (Signed)
INFECTIOUS DISEASE PROGRESS NOTE  ID: Samuel Montgomery is a 56 y.o. male with Crohn's disease with recent ex lap and found perforation of terminal ileum and abscess.  Began to have fever, leukocytosis, CT with inflammatory process and on empiric Invanz and fluconazole.    Subjective: Feels the same  Abtx:  Anti-infectives     Start     Dose/Rate Route Frequency Ordered Stop   07/14/12 1000   fluconazole (DIFLUCAN) IVPB 200 mg        200 mg 100 mL/hr over 60 Minutes Intravenous Every 24 hours 07/13/12 1444     07/09/12 1000   ertapenem (INVANZ) 1 g in sodium chloride 0.9 % 50 mL IVPB        1 g 100 mL/hr over 30 Minutes Intravenous Every 24 hours 07/09/12 0949     07/09/12 1000   fluconazole (DIFLUCAN) IVPB 100 mg  Status:  Discontinued        100 mg 50 mL/hr over 60 Minutes Intravenous Every 24 hours 07/09/12 0949 07/13/12 1444   07/02/12 0500   piperacillin-tazobactam (ZOSYN) IVPB 3.375 g  Status:  Discontinued        3.375 g 12.5 mL/hr over 240 Minutes Intravenous Every 8 hours 07/02/12 0412 07/09/12 0948          Medications: I have reviewed the patient's current medications.  Objective: Vital signs in last 24 hours: Temp:  [98.3 F (36.8 C)-98.6 F (37 C)] 98.3 F (36.8 C) (08/16 0605) Pulse Rate:  [62-77] 68  (08/16 0605) Resp:  [17-18] 17  (08/16 0605) BP: (109-120)/(63-75) 112/63 mmHg (08/16 0605) SpO2:  [97 %-99 %] 97 % (08/16 0605)   General appearance: alert, cooperative and no distress Cardio: regular rate and rhythm, S1, S2 normal, no murmur, click, rub or gallop  Lab Results  Basename 07/14/12 0500 07/13/12 0605  WBC 15.2* 14.8*  HGB 10.6* 10.7*  HCT 31.7* 32.7*  NA -- 133*  K -- 4.2  CL -- 98  CO2 -- 24  BUN -- 13  CREATININE -- 0.69  GLU -- --   Liver Panel  Basename 07/13/12 0605  PROT 7.2  ALBUMIN 2.6*  AST 22  ALT 53  ALKPHOS 211*  BILITOT 0.2*  BILIDIR --  IBILI --   Sedimentation Rate No results found for this basename:  ESRSEDRATE in the last 72 hours C-Reactive Protein No results found for this basename: CRP:2 in the last 72 hours  Microbiology: Recent Results (from the past 240 hour(s))  CULTURE, BLOOD (ROUTINE X 2)     Status: Normal (Preliminary result)   Collection Time   07/13/12  3:30 PM      Component Value Range Status Comment   Specimen Description BLOOD LEFT ARM   Final    Special Requests BOTTLES DRAWN AEROBIC AND ANAEROBIC 10CC   Final    Culture  Setup Time 07/13/2012 23:24   Final    Culture     Final    Value:        BLOOD CULTURE RECEIVED NO GROWTH TO DATE CULTURE WILL BE HELD FOR 5 DAYS BEFORE ISSUING A FINAL NEGATIVE REPORT   Report Status PENDING   Incomplete   CULTURE, BLOOD (ROUTINE X 2)     Status: Normal (Preliminary result)   Collection Time   07/13/12  3:35 PM      Component Value Range Status Comment   Specimen Description BLOOD LEFT WRIST   Final    Special Requests BOTTLES  DRAWN AEROBIC AND ANAEROBIC 10CC   Final    Culture  Setup Time 07/13/2012 23:26   Final    Culture     Final    Value:        BLOOD CULTURE RECEIVED NO GROWTH TO DATE CULTURE WILL BE HELD FOR 5 DAYS BEFORE ISSUING A FINAL NEGATIVE REPORT   Report Status PENDING   Incomplete     Studies/Results: Ct Abdomen Pelvis W Contrast  07/12/2012  *RADIOLOGY REPORT*  Clinical Data: Follow-up inflammatory changes in the abdomen. History of Crohn's disease and ileocecectomy.  CT ABDOMEN AND PELVIS WITH CONTRAST  Technique:  Multidetector CT imaging of the abdomen and pelvis was performed following the standard protocol during bolus administration of intravenous contrast.  Contrast: OMNIPAQUE IOHEXOL 300 MG/ML  SOLN  Comparison: 07/07/2012 and 07/02/2012.  Findings: Lung bases show no acute findings.  Retrocrural lymph node measures 8 mm.  Heart size normal.  No pericardial or pleural effusion.  Liver, gallbladder, adrenal glands, kidneys, spleen, pancreas and stomach are unremarkable.  Inflammatory thickening,  fluid and stranding are again seen in the right lower quadrant, with postoperative changes of ileocecectomy.  There appears to be eccentric wall thickening involving the proximal colon (image 43), unchanged. There may be a tiny focal fluid collection in the adjacent mesentery, measuring 1.3 cm.  Previously, a locule of air was seen within, on 07/07/2012. Adjacent adenopathy measures 1.2 cm, stable.  There are scattered sub centimeter lymph nodes in the small bowel mesentery.  Retroperitoneal lymph nodes measure up to 10 mm in the left periaortic station, as before.  There is thickening of the anterior wall of the bladder, which is likely due to under distention, given a normal appearance on 07/07/2012.  Small bilateral inguinal hernias contain fat.  Left external iliac lymph node measures 10 mm, stable.  No free fluid. No worrisome lytic or sclerotic lesions.  IMPRESSION:  1.  Stable inflammatory process in the right lower quadrant, with apparent eccentric wall thickening involving the proximal colon, and postoperative changes of ileocecectomy. 2.  Possible tiny localized abscess in the adjacent small bowel mesentery, with reactive adenopathy, stable.  Original Report Authenticated By: Reyes Ivan, M.D.     Assessment/Plan: 1) leukocytosis - stable today, continuing Invanz and fluconazole.  I increased the fluconazole to 200 mg daily.  No fever still.    Dr. Ninetta Lights available over the weekend if needed.     Heitor Steinhoff Infectious Diseases 07/14/2012, 1:09 PM

## 2012-07-14 NOTE — Progress Notes (Signed)
Continues to feel better. ON fulls, no fever, abd is benign. Will continue IV antibiotics over the weekend and make plans Monday for long term management > possible home on long course of antibiotics

## 2012-07-14 NOTE — Progress Notes (Signed)
When assessing patient while palpating abdomen near old incision line a very small bubble of serous fluid appeared on the old incision scar. Once cleaned away no other drainage noted, skin is peeling/dark pink on scar. Applied neosporin over area and put dry gauze over it. Will continue to monitor.

## 2012-07-14 NOTE — Progress Notes (Signed)
PARENTERAL NUTRITION CONSULT NOTE - FOLLOW UP  Pharmacy Consult for TNA Indication: SB perforation  Patient Measurements: Height: 5\' 4"  (162.6 cm) Weight: 140 lb 11.2 oz (63.821 kg) IBW/kg (Calculated) : 59.2  Usual Weight: 64 kg  Vital Signs: Temp: 98.3 F (36.8 C) (08/16 0605) Temp src: Oral (08/16 0605) BP: 112/63 mmHg (08/16 0605) Pulse Rate: 68  (08/16 0605) Intake/Output from previous day: 08/15 0701 - 08/16 0700 In: 2603.3 [P.O.:390; I.V.:702.2; TPN:1511.1] Out: 3475 [Urine:3225; Stool:250]  Labs:  [facility] 07/14/12 0500 07/13/12 0605 07/12/12 0615  WBC 15.2* 14.8* 20.0*  HGB 10.6* 10.7* 11.5*  HCT 31.7* 32.7* 34.5*  PLT 420* 459* 508*  APTT -- -- --  INR -- -- --     [facility] 07/13/12 0605  NA 133*  K 4.2  CL 98  CO2 24  GLUCOSE 107*  BUN 13  CREATININE 0.69  LABCREA --  CREAT24HRUR --  CALCIUM 9.0  MG 2.1  PHOS 4.2  PROT 7.2  ALBUMIN 2.6*  AST 22  ALT 53  ALKPHOS 211*  BILITOT 0.2*  BILIDIR --  IBILI --  PREALBUMIN --  TRIG --  CHOLHDL --  CHOL --   Estimated Creatinine Clearance: 85.3 ml/min (by C-G formula based on Cr of 0.69).    Basename 07/14/12 0626 07/13/12 2329 07/13/12 1803  GLUCAP 115* 117* 98    Medications:  Infusions:     . dextrose 5 % and 0.45 % NaCl with KCl 20 mEq/L 1,000 mL (07/12/12 0715)  . TPN (CLINIMIX) +/- additives 80 mL/hr at 07/12/12 1745   And  . fat emulsion 240 mL (07/12/12 1745)  . TPN (CLINIMIX) +/- additives 80 mL/hr at 07/13/12 1754    Nutritional Goals:   New RD recs as of 8/13: Kcal: 1850-2050, Protein: 95-110 gm  Clinimix 5/20 at goal rate of 80 ml/hr + Lipids MWF to provide 96 g protein/day and an average of  1896 Kcal/day   Patient consents to using Beneprotein at 6gm tid = 18 gm daily.  Current nutrition:   TPN at goal rate  Diet: FL (15-25% meal intake per flowsheets)  IVF: D51/2NS with 20 KCL @ 40 ml/hr  CBGs & Insulin requirements past 24 hours:  CBG's stable. No Hx  DM  Assessment:   Previous small bowel perforation, expl lap 6/16 with ileocecectomy, abscess drainage. MRI 8/2 showed a peri-rectal abscess.  TPN started 07/02/12, increased to goal rate on 8/7. CT per MD notes shows no change but no worsening  Per RD note, pt doing well with CL diet now - now trying to advance to full liquid  Electrolytes: Na low, cannot be adjusted in TNA; Other lytes WNL (8/15)  LFTs improving as of 8/15  Pre-Albumin: 8.9 (8/5), 14.6 (8/12)  TG/Cholesterol: wnl (8/5 and 8/12)  Plan:   Continue Clinimix E 5/20 at 80 ml/hr to provide 96 g protein/day. Plan per RD noted and plan to Beneprotein for additional protein needs rather than increase TNA rate that would increase protein but also increase amount of fluid that patient would receive.  Will plan to start weaning TNA once patient tolerates FL diet or per CCS recommendations Fat emulsion at 10 ml/hr (MWF only due to ongoing shortage). Continue PO multivitamin daily (no need for MVI in TNA as a result) Patient to receive trace elements via TNA today (MWF only) Continue IVF at 40 ml/hr. Continue SSI TNA lab panels on Mondays & Thursdays. F/u daily.   Hessie Knows, PharmD, BCPS Pager  [phone] 07/14/2012 8:00 AM

## 2012-07-15 LAB — COMPREHENSIVE METABOLIC PANEL
AST: 36 U/L (ref 0–37)
Albumin: 2.5 g/dL — ABNORMAL LOW (ref 3.5–5.2)
Chloride: 99 mEq/L (ref 96–112)
Creatinine, Ser: 0.71 mg/dL (ref 0.50–1.35)
Potassium: 4 mEq/L (ref 3.5–5.1)
Sodium: 133 mEq/L — ABNORMAL LOW (ref 135–145)
Total Bilirubin: 0.3 mg/dL (ref 0.3–1.2)

## 2012-07-15 LAB — GLUCOSE, CAPILLARY: Glucose-Capillary: 101 mg/dL — ABNORMAL HIGH (ref 70–99)

## 2012-07-15 MED ORDER — CLINIMIX E/DEXTROSE (5/20) 5 % IV SOLN
INTRAVENOUS | Status: AC
  Administered 2012-07-15: 18:00:00 via INTRAVENOUS
  Filled 2012-07-15: qty 2000

## 2012-07-15 NOTE — Progress Notes (Signed)
General Surgery Note  LOS: 14 days  POD# 64 Room - 1304  Assessment/Plan: 1.  Abscess at ileo-colonic anastomosis.  On Invanz and fluconazole.  WBC - 15,200 on 07/14/2012.  Will not repeat CBC until Monday, unless patient has problem.  On full liquids that he is tolerating.  Continue current diet for the weekend.  Then consider advancing diet early next week.  2. EXPLORATORY LAPAROTOMY, ileocectomy, drainage of abscess - T. Gerkin - 05/14/2012  3. Crohn's disease.   Followed by Dr. Leone Payor.   4. COPD  5. Smokes -   He says that he has not smoked since the surgery.  6. Bilateral inguinal hernias. R>L.  7. GERD  8. DVT proph - on Lovenox 9.  Malnutrition - On TPN  Alb - 2.6 on 07/13/2012  Subjective:  Doing well.  Has read some books.  Anxious about CBC and the possibly of worsening of the abscess. Objective:   Filed Vitals:   07/15/12 0705  BP: 101/62  Pulse: 59  Temp: 97.4 F (36.3 C)  Resp: 17     Intake/Output from previous day:  08/16 0701 - 08/17 0700 In: 1722 [P.O.:840; I.V.:294; TPN:588] Out: 2975 [Urine:2675; Stool:300]  Intake/Output this shift:      Physical Exam:   General: WN WM who is alert and oriented.    HEENT: Normal. Pupils equal. .   Lungs: Clear.   Abdomen: Soft.  No tenderness or guarding   Wound: healed.   Lab Results:    Basename 07/14/12 0500 07/13/12 0605  WBC 15.2* 14.8*  HGB 10.6* 10.7*  HCT 31.7* 32.7*  PLT 420* 459*    BMET   Basename 07/15/12 0614 07/13/12 0605  NA 133* 133*  K 4.0 4.2  CL 99 98  CO2 27 24  GLUCOSE 119* 107*  BUN 12 13  CREATININE 0.71 0.69  CALCIUM 9.1 9.0    PT/INR  No results found for this basename: LABPROT:2,INR:2 in the last 72 hours  ABG  No results found for this basename: PHART:2,PCO2:2,PO2:2,HCO3:2 in the last 72 hours   Studies/Results:  No results found.   Anti-infectives:   Anti-infectives     Start     Dose/Rate Route Frequency Ordered Stop   07/14/12 1000   fluconazole  (DIFLUCAN) IVPB 200 mg        200 mg 100 mL/hr over 60 Minutes Intravenous Every 24 hours 07/13/12 1444     07/09/12 1000   ertapenem (INVANZ) 1 g in sodium chloride 0.9 % 50 mL IVPB        1 g 100 mL/hr over 30 Minutes Intravenous Every 24 hours 07/09/12 0949     07/09/12 1000   fluconazole (DIFLUCAN) IVPB 100 mg  Status:  Discontinued        100 mg 50 mL/hr over 60 Minutes Intravenous Every 24 hours 07/09/12 0949 07/13/12 1444   07/02/12 0500   piperacillin-tazobactam (ZOSYN) IVPB 3.375 g  Status:  Discontinued        3.375 g 12.5 mL/hr over 240 Minutes Intravenous Every 8 hours 07/02/12 0412 07/09/12 0948          Ovidio Kin, MD, FACS Pager: [phone],   [facility] Office: [phone] 07/15/2012

## 2012-07-15 NOTE — Progress Notes (Signed)
PARENTERAL NUTRITION CONSULT NOTE - FOLLOW UP  Pharmacy Consult for TNA Indication: SB Perforation  Patient Measurements: Height: 5\' 4"  (162.6 cm) Weight: 140 lb 11.2 oz (63.821 kg) IBW/kg (Calculated) : 59.2  Usual Weight: 64 kg  Vital Signs: Temp: 97.4 F (36.3 C) (08/17 0705) Temp src: Oral (08/17 0705) BP: 101/62 mmHg (08/17 0705) Pulse Rate: 59  (08/17 0705) Intake/Output from previous day: 08/16 0701 - 08/17 0700 In: 1722 [P.O.:840; I.V.:294; TPN:588] Out: 2975 [Urine:2675; Stool:300]  Labs:  [facility] 07/14/12 0500 07/13/12 0605  WBC 15.2* 14.8*  HGB 10.6* 10.7*  HCT 31.7* 32.7*  PLT 420* 459*  APTT -- --  INR -- --     [facility] 07/15/12 0614 07/13/12 0605  NA 133* 133*  K 4.0 4.2  CL 99 98  CO2 27 24  GLUCOSE 119* 107*  BUN 12 13  CREATININE 0.71 0.69  LABCREA -- --  CREAT24HRUR -- --  CALCIUM 9.1 9.0  MG -- 2.1  PHOS -- 4.2  PROT 6.9 7.2  ALBUMIN 2.5* 2.6*  AST 36 22  ALT 69* 53  ALKPHOS 178* 211*  BILITOT 0.3 0.2*  BILIDIR -- --  IBILI -- --  PREALBUMIN -- --  TRIG -- --  CHOLHDL -- --  CHOL -- --   Estimated Creatinine Clearance: 85.3 ml/min (by C-G formula based on Cr of 0.71).    Basename 07/15/12 0703 07/14/12 2227 07/14/12 1740  GLUCAP 133* 116* 106*    Medications:  Infusions:     . dextrose 5 % and 0.45 % NaCl with KCl 20 mEq/L 40 mL/hr at 07/14/12 1449  . TPN (CLINIMIX) +/- additives 80 mL/hr at 07/14/12 1711   And  . fat emulsion 240 mL (07/14/12 1711)  . TPN (CLINIMIX) +/- additives 80 mL/hr at 07/13/12 1754    Nutritional Goals:   New RD recs as of 8/13: Kcal: 1850-2050, Protein: 95-110 gm  Clinimix 5/20 at goal rate of 80 ml/hr + Lipids MWF to provide 96 g protein/day and an average of  1896 Kcal/day   Patient consents to using Beneprotein at 6gm tid = 18 gm daily.  Current nutrition:   TPN at goal rate  Diet: FL (840 ml PO intake recorded 8/16)  IVF: D5-1/2NS with 20 KCL @ 40 ml/hr  CBGs & Insulin  requirements past 24 hours:  CBG's stable, <150. No Hx DM  Assessment:   Previous small bowel perforation, expl lap 6/16 with ileocecectomy, abscess drainage. MRI 8/2 showed a peri-rectal abscess.  TPN started 07/02/12, increased to goal rate on 8/7. CT per MD notes shows no change but no worsening  Pt consumed PO intake yesterday, plus Beneprotein  Electrolytes: Na low, cannot be adjusted in TNA; Other lytes WNL   LFTs - slight bump in ALT, alk phos trending down  Pre-Albumin: 8.9 (8/5), 14.6 (8/12) - next check on Monday  TG/Cholesterol: wnl (8/5 and 8/12) - Next check on Monday  Plan:   Continue Clinimix E 5/20 at 80 ml/hr to provide 96 g protein/day. Plan per RD noted. Plan to order Beneprotein for additional protein needs rather than increase TNA rate, which would increase protein but also increase amount of fluid that patient would receive.  Will plan to start weaning TNA once patient tolerates FL diet or per CCS recommendations Fat emulsion at 10 ml/hr and trace elements MWF only due to ongoing shortage. Continue PO multivitamin daily (no need for MVI in TNA as a result) Continue IVF at 40 ml/hr.  Continue SSI TNA lab panels on Mondays & Thursdays. F/u daily.  Darrol Angel, PharmD Pager: [phone] 07/15/2012 7:39 AM

## 2012-07-16 LAB — GLUCOSE, CAPILLARY
Glucose-Capillary: 109 mg/dL — ABNORMAL HIGH (ref 70–99)
Glucose-Capillary: 76 mg/dL (ref 70–99)
Glucose-Capillary: 110 mg/dL — ABNORMAL HIGH (ref 70–99)
Glucose-Capillary: 135 mg/dL — ABNORMAL HIGH (ref 70–99)

## 2012-07-16 MED ORDER — INSULIN ASPART 100 UNIT/ML ~~LOC~~ SOLN: 0.0000 [IU] | Freq: Three times a day (TID) | SUBCUTANEOUS | Status: DC

## 2012-07-16 MED ORDER — CLINIMIX E/DEXTROSE (5/20) 5 % IV SOLN
INTRAVENOUS | Status: DC
  Administered 2012-07-16: 18:00:00 via INTRAVENOUS
  Filled 2012-07-16: qty 2000

## 2012-07-16 NOTE — Progress Notes (Signed)
PARENTERAL NUTRITION CONSULT NOTE - FOLLOW UP  Pharmacy Consult for TNA Indication: SB Perforation  Patient Measurements: Height: 5\' 4"  (162.6 cm) Weight: 140 lb 11.2 oz (63.821 kg) IBW/kg (Calculated) : 59.2  Usual Weight: 64 kg  Vital Signs: Temp: 98.9 F (37.2 C) (08/18 0605) Temp src: Oral (08/18 0605) BP: 108/66 mmHg (08/18 0605) Pulse Rate: 66  (08/18 0605) Intake/Output from previous day: 08/17 0701 - 08/18 0700 In: 3700 [P.O.:1020; I.V.:1560; IV Piggyback:400; TPN:720] Out: 700 [Urine:700]  Labs:  Basename 07/14/12 0500  WBC 15.2*  HGB 10.6*  HCT 31.7*  PLT 420*  APTT --  INR --     Basename 07/15/12 0614  NA 133*  K 4.0  CL 99  CO2 27  GLUCOSE 119*  BUN 12  CREATININE 0.71  LABCREA --  CREAT24HRUR --  CALCIUM 9.1  MG --  PHOS --  PROT 6.9  ALBUMIN 2.5*  AST 36  ALT 69*  ALKPHOS 178*  BILITOT 0.3  BILIDIR --  IBILI --  PREALBUMIN --  TRIG --  CHOLHDL --  CHOL --   Estimated Creatinine Clearance: 85.3 ml/min (by C-G formula based on Cr of 0.71).    Basename 07/16/12 0601 07/15/12 2303 07/15/12 1818  GLUCAP 109* 143* 113*    Medications:  Infusions:     . dextrose 5 % and 0.45 % NaCl with KCl 20 mEq/L 40 mL/hr at 07/15/12 1827  . TPN (CLINIMIX) +/- additives 80 mL/hr at 07/14/12 1711   And  . fat emulsion 240 mL (07/14/12 1711)  . TPN (CLINIMIX) +/- additives 80 mL/hr at 07/15/12 1732    Nutritional Goals:   New RD recs as of 8/13: Kcal: 1850-2050, Protein: 95-110 gm  Clinimix 5/20 at goal rate of 80 ml/hr + Lipids MWF to provide 96 g protein/day and an average of  1896 Kcal/day   Patient consents to using Beneprotein at 6gm tid = 18 gm daily.  Current nutrition:   TPN at goal rate  Diet: FL ( recorded 8/17, up from 840 ml PO intake recorded 8/16)  IVF: D5-1/2NS with 20 KCL @ 40 ml/hr  CBGs & Insulin requirements past 24 hours:  CBG's stable, <150. No Hx DM  Assessment:   Previous small bowel perforation,  expl lap 6/16 with ileocecectomy, abscess drainage. MRI 8/2 showed a peri-rectal abscess.  TPN started 07/02/12, increased to goal rate on 8/7. CT per MD notes shows no change but no worsening  Per MD note yesterday, pt is tolerating FLD. Possible further diet advancement this week.  Electrolytes: none today  LFTs - slight bump in ALT, alk phos trending down on 8/17  Pre-Albumin: 8.9 (8/5), 14.6 (8/12) - next check on Monday  TG/Cholesterol: wnl (8/5 and 8/12) - Next check on Monday  Plan:   Continue Clinimix E 5/20 at 80 ml/hr to provide 96 g protein/day. Plan per RD noted. Plan to order Beneprotein for additional protein needs rather than increase TNA rate, which would increase protein but also increase amount of fluid that patient would receive.  Fat emulsion at 10 ml/hr and trace elements MWF only due to ongoing shortage. Continue PO multivitamin daily (no need for MVI in TNA as a result) Continue IVF at 40 ml/hr. Continue SSI at sensitive scale, but change to TID with meals (no HS coverage) TNA lab panels on Mondays & Thursdays. Will plan to start weaning TNA once patient tolerates FL diet or per CCS recommendations (hopefully can wean TNA to off by Monday  night?)  Darrol Angel, PharmD Pager: [phone] 07/16/2012 6:25 AM

## 2012-07-16 NOTE — Progress Notes (Signed)
General Surgery Note  LOS: 15 days  POD# 65 Room - 1304  Assessment/Plan: 1.  Abscess at ileo-colonic anastomosis.  On Invanz and fluconazole.  WBC - 15,200 on 07/14/2012.  Will not repeat CBC until Monday, unless patient has problem.  On full liquids that he is tolerating.  Consider advancing diet early next week.  2. EXPLORATORY LAPAROTOMY, ileocectomy, drainage of abscess - T. Gerkin - 05/14/2012  3. Crohn's disease.   Followed by Dr. Leone Payor.   4. COPD  5. Smokes -   He says that he has not smoked since the surgery.  6. Bilateral inguinal hernias. R>L.  7. GERD  8. DVT proph - on Lovenox 9.  Malnutrition - On TPN  Question to start weaning Monday, 8/19.  Alb - 2.6 on 07/13/2012  Subjective:  He says he actually feels as good as he has felt since surgery.  Very active.  No problems, except anxious about getting better. Objective:   Filed Vitals:   07/16/12 0605  BP: 108/66  Pulse: 66  Temp: 98.9 F (37.2 C)  Resp: 18     Intake/Output from previous day:  08/17 0701 - 08/18 0700 In: 3700 [P.O.:1020; I.V.:1560; IV Piggyback:400; TPN:720] Out: 700 [Urine:700]  Intake/Output this shift:      Physical Exam:   General: WN WM who is alert and oriented.    HEENT: Normal. Pupils equal. .   Lungs: Clear.   Abdomen: Soft.  No tenderness or guarding   Wound: He has one small draining area in the upper part of the wound.  It was not draining yesterday.     Lab Results:     Basename 07/14/12 0500  WBC 15.2*  HGB 10.6*  HCT 31.7*  PLT 420*    BMET    Basename 07/15/12 0614  NA 133*  K 4.0  CL 99  CO2 27  GLUCOSE 119*  BUN 12  CREATININE 0.71  CALCIUM 9.1    PT/INR  No results found for this basename: LABPROT:2,INR:2 in the last 72 hours  ABG  No results found for this basename: PHART:2,PCO2:2,PO2:2,HCO3:2 in the last 72 hours   Studies/Results:  No results found.   Anti-infectives:   Anti-infectives     Start     Dose/Rate Route Frequency Ordered  Stop   07/14/12 1000   fluconazole (DIFLUCAN) IVPB 200 mg        200 mg 100 mL/hr over 60 Minutes Intravenous Every 24 hours 07/13/12 1444     07/09/12 1000   ertapenem (INVANZ) 1 g in sodium chloride 0.9 % 50 mL IVPB        1 g 100 mL/hr over 30 Minutes Intravenous Every 24 hours 07/09/12 0949     07/09/12 1000   fluconazole (DIFLUCAN) IVPB 100 mg  Status:  Discontinued        100 mg 50 mL/hr over 60 Minutes Intravenous Every 24 hours 07/09/12 0949 07/13/12 1444   07/02/12 0500   piperacillin-tazobactam (ZOSYN) IVPB 3.375 g  Status:  Discontinued        3.375 g 12.5 mL/hr over 240 Minutes Intravenous Every 8 hours 07/02/12 0412 07/09/12 0948          Ovidio Kin, MD, FACS Pager: [phone],   [facility] Office: [phone] 07/16/2012

## 2012-07-17 LAB — COMPREHENSIVE METABOLIC PANEL
ALT: 116 U/L — ABNORMAL HIGH (ref 0–53)
AST: 71 U/L — ABNORMAL HIGH (ref 0–37)
Albumin: 2.4 g/dL — ABNORMAL LOW (ref 3.5–5.2)
Calcium: 9.1 mg/dL (ref 8.4–10.5)
GFR calc Af Amer: >90 mL/min (ref >90)
Sodium: 135 mEq/L (ref 135–145)
Total Protein: 6.8 g/dL (ref 6.0–8.3)

## 2012-07-17 LAB — DIFFERENTIAL
Basophils Relative: 2 % — ABNORMAL HIGH (ref 0–1)
Eosinophils Relative: 7 % — ABNORMAL HIGH (ref 0–5)
Monocytes Absolute: 0.9 10*3/uL (ref 0.1–1.0)
Neutro Abs: 5.4 10*3/uL (ref 1.7–7.7)
Neutrophils Relative %: 58 % (ref 43–77)

## 2012-07-17 LAB — CHOLESTEROL, TOTAL: Cholesterol: 100 mg/dL (ref 0–200)

## 2012-07-17 LAB — PHOSPHORUS: Phosphorus: 4.4 mg/dL (ref 2.3–4.6)

## 2012-07-17 LAB — CBC
HCT: 29.7 % — ABNORMAL LOW (ref 39.0–52.0)
Hemoglobin: 9.8 g/dL — ABNORMAL LOW (ref 13.0–17.0)
MCH: 28.4 pg (ref 26.0–34.0)
RBC: 3.45 MIL/uL — ABNORMAL LOW (ref 4.22–5.81)

## 2012-07-17 LAB — GLUCOSE, CAPILLARY: Glucose-Capillary: 113 mg/dL — ABNORMAL HIGH (ref 70–99)

## 2012-07-17 LAB — TRIGLYCERIDES: Triglycerides: 178 mg/dL — ABNORMAL HIGH (ref <150)

## 2012-07-17 MED ORDER — AMOXICILLIN-POT CLAVULANATE 875-125 MG PO TABS
1.0000 | ORAL_TABLET | Freq: Two times a day (BID) | ORAL | Status: DC
  Administered 2012-07-17 – 2012-07-19 (×5): 1 via ORAL
  Filled 2012-07-17 (×6): qty 1

## 2012-07-17 MED ORDER — FLUCONAZOLE 200 MG PO TABS
200.0000 mg | ORAL_TABLET | Freq: Every day | ORAL | Status: DC
  Administered 2012-07-18 – 2012-07-19 (×2): 200 mg via ORAL
  Filled 2012-07-17 (×2): qty 1

## 2012-07-17 MED ORDER — CLINIMIX E/DEXTROSE (5/20) 5 % IV SOLN
INTRAVENOUS | Status: AC
  Filled 2012-07-17: qty 2000

## 2012-07-17 NOTE — Progress Notes (Signed)
Patient ID: Samuel Montgomery, male   DOB: Apr 17, 1956, 56 y.o.   MRN: [id number]    Subjective: Pt feeling the best he's felt.  tol full liquids without any issues  Objective: Vital signs in last 24 hours: Temp:  [97.5 F (36.4 C)-98.5 F (36.9 C)] 98.5 F (36.9 C) (08/19 0602) Pulse Rate:  [61-64] 64  (08/19 0602) Resp:  [16-18] 18  (08/19 0602) BP: (100-109)/(60-70) 109/70 mmHg (08/19 0602) SpO2:  [97 %-98 %] 97 % (08/19 0602) Last BM Date: 07/17/12  Intake/Output from previous day: 08/18 0701 - 08/19 0700 In: 5546 [P.O.:1540; I.V.:966; TPN:3040] Out: 2750 [Urine:2750] Intake/Output this shift: Total I/O In: -  Out: 800 [Urine:800]  PE: Abd: soft, NT, ND, + BS  Lab Results:   [facility] 07/17/12 0555  WBC 9.4  HGB 9.8*  HCT 29.7*  PLT 379   BMET  Basename 07/17/12 0555 07/15/12 0614  NA 135 133*  K 4.2 4.0  CL 101 99  CO2 29 27  GLUCOSE 99 119*  BUN 12 12  CREATININE 0.70 0.71  CALCIUM 9.1 9.1   PT/INR No results found for this basename: LABPROT:2,INR:2 in the last 72 hours CMP     Component Value Date/Time   NA 135 07/17/2012 0555   K 4.2 07/17/2012 0555   CL 101 07/17/2012 0555   CO2 29 07/17/2012 0555   GLUCOSE 99 07/17/2012 0555   BUN 12 07/17/2012 0555   CREATININE 0.70 07/17/2012 0555   CREATININE 0.91 01/04/2012 1230   CALCIUM 9.1 07/17/2012 0555   PROT 6.8 07/17/2012 0555   ALBUMIN 2.4* 07/17/2012 0555   AST 71* 07/17/2012 0555   ALT 116* 07/17/2012 0555   ALKPHOS 171* 07/17/2012 0555   BILITOT 0.2* 07/17/2012 0555   GFRNONAA >90 07/17/2012 0555   GFRAA >90 07/17/2012 0555   Lipase     Component Value Date/Time   LIPASE 43 07/01/2012 2115       Studies/Results: No results found.  Anti-infectives: Anti-infectives     Start     Dose/Rate Route Frequency Ordered Stop   07/14/12 1000   fluconazole (DIFLUCAN) IVPB 200 mg        200 mg 100 mL/hr over 60 Minutes Intravenous Every 24 hours 07/13/12 1444     07/09/12 1000   ertapenem (INVANZ) 1 g in  sodium chloride 0.9 % 50 mL IVPB        1 g 100 mL/hr over 30 Minutes Intravenous Every 24 hours 07/09/12 0949     07/09/12 1000   fluconazole (DIFLUCAN) IVPB 100 mg  Status:  Discontinued        100 mg 50 mL/hr over 60 Minutes Intravenous Every 24 hours 07/09/12 0949 07/13/12 1444   07/02/12 0500   piperacillin-tazobactam (ZOSYN) IVPB 3.375 g  Status:  Discontinued        3.375 g 12.5 mL/hr over 240 Minutes Intravenous Every 8 hours 07/02/12 0412 07/09/12 0948           Assessment/Plan  1. RLQ inflammation 2. S/p ileocecectomy 3. Crohn's disease 4. PCM/TNA  Plan: 1. Dc TNA 2. Adv to full liquids 3. ? Change to oral abx therapy 4. D/W MD re timing of next CT scan.   LOS: 16 days    Elior Robinette E 07/17/2012

## 2012-07-17 NOTE — Progress Notes (Signed)
INFECTIOUS DISEASE PROGRESS NOTE  ID: Samuel Montgomery is a 56 y.o. male with   Active Problems:  Crohn's ileocolitis  Subjective: Normal BM, without complaints  Abtx:  Anti-infectives     Start     Dose/Rate Route Frequency Ordered Stop   07/18/12 1000   fluconazole (DIFLUCAN) tablet 200 mg        200 mg Oral Daily 07/17/12 1328     07/17/12 1400  amoxicillin-clavulanate (AUGMENTIN) 875-125 MG per tablet 1 tablet       1 tablet Oral Every 12 hours 07/17/12 1328     07/14/12 1000   fluconazole (DIFLUCAN) IVPB 200 mg  Status:  Discontinued        200 mg 100 mL/hr over 60 Minutes Intravenous Every 24 hours 07/13/12 1444 07/17/12 1328   07/09/12 1000   ertapenem (INVANZ) 1 g in sodium chloride 0.9 % 50 mL IVPB  Status:  Discontinued        1 g 100 mL/hr over 30 Minutes Intravenous Every 24 hours 07/09/12 0949 07/17/12 1328   07/09/12 1000   fluconazole (DIFLUCAN) IVPB 100 mg  Status:  Discontinued        100 mg 50 mL/hr over 60 Minutes Intravenous Every 24 hours 07/09/12 0949 07/13/12 1444   07/02/12 0500   piperacillin-tazobactam (ZOSYN) IVPB 3.375 g  Status:  Discontinued        3.375 g 12.5 mL/hr over 240 Minutes Intravenous Every 8 hours 07/02/12 0412 07/09/12 0948          Medications:  Scheduled:   . amoxicillin-clavulanate  1 tablet Oral Q12H  . aspirin  81 mg Oral Daily  . enoxaparin  40 mg Subcutaneous Q24H  . fluconazole  200 mg Oral Daily  . multivitamin with minerals  1 tablet Oral Daily  . pantoprazole  40 mg Oral Q1200  . protein supplement  1 scoop Oral TID WC  . sodium chloride  10-40 mL Intracatheter Q12H  . DISCONTD: ertapenem  1 g Intravenous Q24H  . DISCONTD: fluconazole (DIFLUCAN) IV  200 mg Intravenous Q24H  . DISCONTD: insulin aspart  0-9 Units Subcutaneous TID WC    Objective: Vital signs in last 24 hours: Temp:  [97.5 F (36.4 C)-98.5 F (36.9 C)] 98.5 F (36.9 C) (08/19 0602) Pulse Rate:  [61-64] 64  (08/19 0602) Resp:  [16-18] 18   (08/19 0602) BP: (100-109)/(60-70) 109/70 mmHg (08/19 0602) SpO2:  [97 %-98 %] 97 % (08/19 0602) Weight:  [67.246 kg (148 lb 4 oz)] 67.246 kg (148 lb 4 oz) (08/19 0830)   General appearance: alert, cooperative and no distress GI: normal findings: bowel sounds normal and soft, non-tender and scar clean, well healed.   Lab Results  Basename 07/17/12 0555 07/15/12 0614  WBC 9.4 --  HGB 9.8* --  HCT 29.7* --  NA 135 133*  K 4.2 4.0  CL 101 99  CO2 29 27  BUN 12 12  CREATININE 0.70 0.71  GLU -- --   Liver Panel  Basename 07/17/12 0555 07/15/12 0614  PROT 6.8 6.9  ALBUMIN 2.4* 2.5*  AST 71* 36  ALT 116* 69*  ALKPHOS 171* 178*  BILITOT 0.2* 0.3  BILIDIR -- --  IBILI -- --   Sedimentation Rate No results found for this basename: ESRSEDRATE in the last 72 hours C-Reactive Protein No results found for this basename: CRP:2 in the last 72 hours  Microbiology: Recent Results (from the past 240 hour(s))  CULTURE, BLOOD (ROUTINE  X 2)     Status: Normal (Preliminary result)   Collection Time   07/13/12  3:30 PM      Component Value Range Status Comment   Specimen Description BLOOD LEFT ARM   Final    Special Requests BOTTLES DRAWN AEROBIC AND ANAEROBIC 10CC   Final    Culture  Setup Time 07/13/2012 23:24   Final    Culture     Final    Value:        BLOOD CULTURE RECEIVED NO GROWTH TO DATE CULTURE WILL BE HELD FOR 5 DAYS BEFORE ISSUING A FINAL NEGATIVE REPORT   Report Status PENDING   Incomplete   CULTURE, BLOOD (ROUTINE X 2)     Status: Normal (Preliminary result)   Collection Time   07/13/12  3:35 PM      Component Value Range Status Comment   Specimen Description BLOOD LEFT WRIST   Final    Special Requests BOTTLES DRAWN AEROBIC AND ANAEROBIC 10CC   Final    Culture  Setup Time 07/13/2012 23:26   Final    Culture     Final    Value:        BLOOD CULTURE RECEIVED NO GROWTH TO DATE CULTURE WILL BE HELD FOR 5 DAYS BEFORE ISSUING A FINAL NEGATIVE REPORT   Report Status  PENDING   Incomplete     Studies/Results: No results found.   Assessment/Plan: Leukocytosis Crohn's Abscess post ex-lap  Total days of antibiotics 16    Diflucan   Day 9    Invanz    Day 8    zosyn    Day 8  Now on PO anbx (augmentin/diflucan) He has multiple questions about his follow up. I offered to have him f/u in ID he likes.  His WBC has normalized, no fever, could consider 2 more weeks of po anbx. The decision to get f/u CT will be left to his f/u MD>  Available if questions.    Johny Sax Infectious Diseases [phone] 07/17/2012, 2:30 PM   LOS: 16 days

## 2012-07-17 NOTE — Progress Notes (Signed)
General Surgery [facility] Patient seen and examined.  Long discussion.  Starting regular diet today.  Continue IV abx - likely convert to po abx with diflucan and augmentin in day or two if diet tolerated.  Patient understands and agrees. Velora Heckler, MD, [facility] Office: [phone]

## 2012-07-17 NOTE — Progress Notes (Addendum)
CARE MANAGE MENT UTILIZATION REVIEW NOTE 07/17/2012     Patient:  Samuel Montgomery, Samuel Montgomery   Account Number:  [id number]  Documented by:  Marcelle Smiling  Per Ur Regulation Reviewed for med. necessity/level of care/duration of stay

## 2012-07-18 NOTE — Progress Notes (Signed)
Rested through out the night

## 2012-07-18 NOTE — Progress Notes (Signed)
Nutrition Brief Note  - TNA d/c yesterday. Pt reports he has been doing well with full liquids and regular diet that was started yesterday. Pt eating 100% of meals. Pt states he has been taking in the Beneprotein and finds it best when mixed with orange juice. Pt getting 3 packets of Beneprotein for a total of 18g protein, 75 calories. Pt had BM today. Pt states he will probably go home tomorrow. Pt without any nutrition concerns.   Dietitian# [phone]

## 2012-07-18 NOTE — Progress Notes (Signed)
Patient ID: Samuel Montgomery, male   DOB: 1956-01-22, 56 y.o.   MRN: [id number]  General Surgery - [facility] - Progress Note  Subjective: Patient comfortable.  No pain.  Regular diet yesterday.  No nausea or emesis.  Patient on oral antibiotics now.  Patient requests follow up CT scan prior to discharge.  Objective: Vital signs in last 24 hours: Temp:  [98.4 F (36.9 C)-98.8 F (37.1 C)] 98.4 F (36.9 C) (08/20 0548) Pulse Rate:  [67-70] 67  (08/20 0548) Resp:  [17-18] 17  (08/20 0548) BP: (99-104)/(55-64) 104/62 mmHg (08/20 0548) SpO2:  [97 %-100 %] 97 % (08/20 0548) Last BM Date: 07/18/12  Intake/Output from previous day: 08/19 0701 - 08/20 0700 In: 4020 [P.O.:1200; I.V.:940; TPN:1880] Out: 3850 [Urine:3850]  Exam: HEENT - clear, not icteric Neck - soft Chest - clear bilaterally Cor - RRR, no murmur Abd - soft without distension; no tenderness; BS present; no mass; dry dressing in midline - wound essentially closed Ext - no significant edema Neuro - grossly intact, no focal deficits  Lab Results:   Basename 07/17/12 0555  WBC 9.4  HGB 9.8*  HCT 29.7*  PLT 379     Basename 07/17/12 0555  NA 135  K 4.2  CL 101  CO2 29  GLUCOSE 99  BUN 12  CREATININE 0.70  CALCIUM 9.1    Studies/Results: No results found.  Assessment / Plan: 1.  Status ileocecectomy for Crohn's disease  - oral Augmentin and Diflucan for 2 weeks per ID consultant  - will order CT abdomen for AM 6/21 at patient request  - hopefully home 1-2 days if clinically stable  - heplock PICC line  Velora Heckler, MD, [facility] Office: [phone]  07/18/2012

## 2012-07-19 ENCOUNTER — Inpatient Hospital Stay (HOSPITAL_COMMUNITY): Payer: BC Managed Care – PPO

## 2012-07-19 LAB — CULTURE, BLOOD (ROUTINE X 2): Culture: NO GROWTH

## 2012-07-19 MED ORDER — IOHEXOL 300 MG/ML  SOLN
100.0000 mL | Freq: Once | INTRAMUSCULAR | Status: AC | PRN
  Administered 2012-07-19: 100 mL via INTRAVENOUS

## 2012-07-19 MED ORDER — FLUCONAZOLE 200 MG PO TABS: 200.0000 mg | ORAL_TABLET | Freq: Every day | ORAL | Status: AC

## 2012-07-19 MED ORDER — AMOXICILLIN-POT CLAVULANATE 875-125 MG PO TABS: 1.0000 | ORAL_TABLET | Freq: Two times a day (BID) | ORAL | Status: AC

## 2012-07-19 NOTE — Progress Notes (Signed)
Per MD order, PICC line removed. Cath intact at 41cm. Vaseline pressure gauze to site, pressure held x 5min. No bleeding to site. Pt instructed to keep dressing CDI x 24 hours. Avoid heavy lifting, pushing or pulling x 24 hours,  If bleeding occurs hold pressure, if bleeding does not stop contact MD or go to the ED. Pt does not have any questions.  Erol Flanagin M  

## 2012-07-19 NOTE — Discharge Summary (Signed)
General Surgery [facility] Agree with above.  See today's progress note.  Discharge. Velora Heckler, MD, [facility] Office: [phone]

## 2012-07-19 NOTE — Progress Notes (Signed)
General Surgery [facility] Patient seen and examined.  CT results reviewed with patient.  He will follow up with me and with Dr. Leone Payor from GI in about 3 weeks.  Continue oral abx's for additional 14 days per ID consult.  May be discharged home this afternoon. Velora Heckler, MD, [facility] Office: [phone]

## 2012-07-19 NOTE — Progress Notes (Signed)
Pt discharged home in stable condition. Discharge instructions and scripts given. Pt verbalized understanding 

## 2012-07-19 NOTE — Progress Notes (Signed)
Patient ID: Samuel Montgomery, male   DOB: 10-Nov-1956, 56 y.o.   MRN: [id number]    Subjective: Pt feels well today.  Ready to go home.  Objective: Vital signs in last 24 hours: Temp:  [98.1 F (36.7 C)-99.1 F (37.3 C)] 98.1 F (36.7 C) (08/21 0543) Pulse Rate:  [65-72] 65  (08/21 0543) Resp:  [16-18] 16  (08/21 0543) BP: (94-113)/(62-72) 94/62 mmHg (08/21 0543) SpO2:  [97 %] 97 % (08/21 0543) Last BM Date: 07/19/12  Intake/Output from previous day: 08/20 0701 - 08/21 0700 In: 1340 [P.O.:1320; I.V.:20] Out: 1500 [Urine:1500] Intake/Output this shift:    PE: Abd: soft, NT, ND  Lab Results:   Basename 07/17/12 0555  WBC 9.4  HGB 9.8*  HCT 29.7*  PLT 379   BMET  Basename 07/17/12 0555  NA 135  K 4.2  CL 101  CO2 29  GLUCOSE 99  BUN 12  CREATININE 0.70  CALCIUM 9.1   PT/INR No results found for this basename: LABPROT:2,INR:2 in the last 72 hours CMP     Component Value Date/Time   NA 135 07/17/2012 0555   K 4.2 07/17/2012 0555   CL 101 07/17/2012 0555   CO2 29 07/17/2012 0555   GLUCOSE 99 07/17/2012 0555   BUN 12 07/17/2012 0555   CREATININE 0.70 07/17/2012 0555   CREATININE 0.91 01/04/2012 1230   CALCIUM 9.1 07/17/2012 0555   PROT 6.8 07/17/2012 0555   ALBUMIN 2.4* 07/17/2012 0555   AST 71* 07/17/2012 0555   ALT 116* 07/17/2012 0555   ALKPHOS 171* 07/17/2012 0555   BILITOT 0.2* 07/17/2012 0555   GFRNONAA >90 07/17/2012 0555   GFRAA >90 07/17/2012 0555   Lipase     Component Value Date/Time   LIPASE 43 07/01/2012 2115       Studies/Results: No results found.  Anti-infectives: Anti-infectives     Start     Dose/Rate Route Frequency Ordered Stop   07/18/12 1000   fluconazole (DIFLUCAN) tablet 200 mg        200 mg Oral Daily 07/17/12 1328     07/17/12 1400   amoxicillin-clavulanate (AUGMENTIN) 875-125 MG per tablet 1 tablet        1 tablet Oral Every 12 hours 07/17/12 1328     07/14/12 1000   fluconazole (DIFLUCAN) IVPB 200 mg  Status:  Discontinued        200 mg 100 mL/hr over 60 Minutes Intravenous Every 24 hours 07/13/12 1444 07/17/12 1328   07/09/12 1000   ertapenem (INVANZ) 1 g in sodium chloride 0.9 % 50 mL IVPB  Status:  Discontinued        1 g 100 mL/hr over 30 Minutes Intravenous Every 24 hours 07/09/12 0949 07/17/12 1328   07/09/12 1000   fluconazole (DIFLUCAN) IVPB 100 mg  Status:  Discontinued        100 mg 50 mL/hr over 60 Minutes Intravenous Every 24 hours 07/09/12 0949 07/13/12 1444   07/02/12 0500   piperacillin-tazobactam (ZOSYN) IVPB 3.375 g  Status:  Discontinued        3.375 g 12.5 mL/hr over 240 Minutes Intravenous Every 8 hours 07/02/12 0412 07/09/12 0948           Assessment/Plan  1. RLQ inflammation, s/p ileocecectomy  Plan: 1. Await CT today, if this looks ok then patient would be ready for dc home 2. 2 weeks of abx therapy at home and follow up with Dr. Gerrit Friends in 3 weeks.   LOS: 18  days    Koree Schopf E 07/19/2012

## 2012-07-19 NOTE — Discharge Summary (Signed)
Patient ID: Samuel Montgomery MRN: [id number] DOB/AGE: [birth year] 57 y.o.  Admit date: 07/01/2012 Discharge date: 07/19/2012  Procedures: None  Consults: ID and GI  Reason for Admission: This is a 56 yo male who underwent a ileocecectomy for crohn's disease on 05-14-12.  He presented to [facility] on 07-01-12 with increasing perianal pain and fever.  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  He c/o decrease in appetite and loose stools.  He saw Dr. Leone Payor for these complaints and was sent for an MRI where there was a suggestion of a 2cm abscess in the rectal area.  He was started on flagyl, but due to persistent fever he came to the [facility].  Admission Diagnoses:  1. Right perianal abscess 2. Leukocytosis 3. Crohn's disease 4. Copd 5. S/p exploratory laparotomy with ileocecectomy  Hospital Course: The patient was admitted to the hospital.  Given no pain in the rectal area, a CT of the Abd/Pel was done which revealed interloop gas collection (no real abscess) in the RLQ.  Dr. Christella Hartigan with GI was consulted.  He was then placed on IV antibiotics, and made NPO and placed on TNA.  This area in the RLQ was felt to likely be related to a small anastomotic leak with no drainable abscess.  A repeat CT scan was done on HD# 6.  The patient was improved at this time and wanting to eat.  His CT scan revealed improvement in the soft tissue edema and phlegmon.  He was started on clear liquids.  The following day he was not feeling as well and WBC had increased some.  Clear liquids were stopped and his abx therapy was changed to IV invanz and diflucan.  2 days later he was feeling better again.  Clear liquids were resumed.  He was on clear liquids for several days and ended up tolerating them well.  His WBC continued to increase though and a repeat CT scan was once again ordered about 5 days after his last scan.  This continued to show no worsening, but minimal improvement.  The following day his WBCs began to decrease and  eventually normalized.  ID did see the patient at this time with no further recommendations or changes.  His diet was slowly advanced as tolerated.  Per patient request, a repeat CT scan was done on HD# 18, despite the patient clinically improving.  This revealed decreased inflammation in the RLQ, but a new finding of an ileo-ileal fistula.  There was nothing new to do for this fistula at the time.  He will follow up with GI for treatment for continued crohn's disease.   His antibiotics were switched to oral Augmentin and diflucan which he tolerated as well.  He was otherwise at this time felt stable for discharge home on 2 additional weeks or oral antibiotic therapy.  Discharge Diagnoses:  Active Problems:  Crohn's ileocolitis RLQ inflammation, likely secondary to small anastomotic leak S/p ileocecectomy Ileo-ileal fistula Patient Active Problem List  Diagnosis  . Psoriasis  . GERD (gastroesophageal reflux disease)  . Bilateral inguinal hernia  . Personal history of colonic polyps-rectal adenoma  . Crohn's ileocolitis  . Cigarette smoker  . Calcified granuloma of lung  . Alcohol dependence  . 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 Nonspecific (abnormal) findings on radiological and other examination of gastrointestinal tract     Discharge Medications: Medication List  As of 07/19/2012 10:51 AM   STOP taking these medications  ciprofloxacin 500 MG tablet      metroNIDAZOLE 250 MG tablet         TAKE these medications         amoxicillin-clavulanate 875-125 MG per tablet   Commonly known as: AUGMENTIN   Take 1 tablet by mouth every 12 (twelve) hours.      aspirin 81 MG chewable tablet   Chew 81 mg by mouth daily.      fluconazole 200 MG tablet   Commonly known as: DIFLUCAN   Take 1 tablet (200 mg total) by mouth daily.      HYDROcodone-acetaminophen 5-325 MG per tablet   Commonly known as: NORCO/VICODIN   Take 1 tablet by mouth every 6 (six) hours as needed. For pain.        multivitamin with minerals Tabs   Take 1 tablet by mouth daily.            Discharge Instructions: Follow-up Information    Follow up with Velora Heckler, MD. Schedule an appointment as soon as possible for a visit in 3 weeks.   Contact information:   [address] [place] [address] [phone]        Dr. Stan Head on 08-11-12 at 11:00am  Signed: Letha Cape 07/19/2012, 10:51 AM'

## 2012-08-03 ENCOUNTER — Telehealth: Payer: Self-pay | Admitting: Internal Medicine

## 2012-08-03 DIAGNOSIS — K611 Rectal abscess: Secondary | ICD-10-CM

## 2012-08-03 NOTE — Telephone Encounter (Signed)
Ok to do a CBC this week or Monday

## 2012-08-03 NOTE — Telephone Encounter (Signed)
Patient has completed his antibiotics from perianal abscess surgery.  He is requesting his white cell count be checked every week "to look for possible infection" Dr. Leone Payor please advise

## 2012-08-03 NOTE — Telephone Encounter (Signed)
I have left a message for the patient that he may come for labs on Monday

## 2012-08-07 ENCOUNTER — Other Ambulatory Visit (INDEPENDENT_AMBULATORY_CARE_PROVIDER_SITE_OTHER): Payer: BC Managed Care – PPO

## 2012-08-07 ENCOUNTER — Ambulatory Visit (INDEPENDENT_AMBULATORY_CARE_PROVIDER_SITE_OTHER): Payer: BC Managed Care – PPO | Admitting: Family Medicine

## 2012-08-07 ENCOUNTER — Encounter: Payer: Self-pay | Admitting: Family Medicine

## 2012-08-07 VITALS — BP 132/78 | HR 73 | Temp 98.2°F | Resp 18 | Ht 65.0 in | Wt 142.0 lb

## 2012-08-07 DIAGNOSIS — R109 Unspecified abdominal pain: Secondary | ICD-10-CM

## 2012-08-07 DIAGNOSIS — K611 Rectal abscess: Secondary | ICD-10-CM

## 2012-08-07 DIAGNOSIS — K612 Anorectal abscess: Secondary | ICD-10-CM

## 2012-08-07 LAB — POCT CBC
Granulocyte percent: 81.7 %G — AB (ref 37–80)
HCT, POC: 38.9 % — AB (ref 43.5–53.7)
Hemoglobin: 11.9 g/dL — AB (ref 14.1–18.1)
Lymph, poc: 2.1 (ref 0.6–3.4)
MCH, POC: 26.9 pg — AB (ref 27–31.2)
MCHC: 30.6 g/dL — AB (ref 31.8–35.4)
MCV: 87.8 fL (ref 80–97)
MID (cbc): 0.7 (ref 0–0.9)
MPV: 7.4 fL (ref 0–99.8)
POC Granulocyte: 12.3 — AB (ref 2–6.9)
POC LYMPH PERCENT: 13.6 %L (ref 10–50)
POC MID %: 4.7 %M (ref 0–12)
Platelet Count, POC: 463 10*3/uL — AB (ref 142–424)
RBC: 4.43 M/uL — AB (ref 4.69–6.13)
RDW, POC: 14.7 %
WBC: 15.1 10*3/uL — AB (ref 4.6–10.2)

## 2012-08-07 LAB — CBC WITH DIFFERENTIAL/PLATELET
Basophils Absolute: 0.1 10*3/uL (ref 0.0–0.1)
Eosinophils Absolute: 0.6 10*3/uL (ref 0.0–0.7)
HCT: 34.2 % — ABNORMAL LOW (ref 39.0–52.0)
Hemoglobin: 11.1 g/dL — ABNORMAL LOW (ref 13.0–17.0)
Lymphs Abs: 2.1 10*3/uL (ref 0.7–4.0)
MCHC: 32.7 g/dL (ref 30.0–36.0)
MCV: 85.6 fl (ref 78.0–100.0)
Monocytes Absolute: 1 10*3/uL (ref 0.1–1.0)
Neutro Abs: 11.3 10*3/uL — ABNORMAL HIGH (ref 1.4–7.7)
Platelets: 399 10*3/uL (ref 150.0–400.0)
RDW: 14.9 % — ABNORMAL HIGH (ref 11.5–14.6)

## 2012-08-07 MED ORDER — CEFTRIAXONE SODIUM 1 G IJ SOLR: 1.0000 g | INTRAMUSCULAR | Status: DC

## 2012-08-07 MED ORDER — OXYCODONE-ACETAMINOPHEN 5-325 MG PO TABS: 1.0000 | ORAL_TABLET | Freq: Three times a day (TID) | ORAL | Status: DC | PRN

## 2012-08-07 MED ORDER — METRONIDAZOLE 500 MG PO TABS: 500.0000 mg | ORAL_TABLET | Freq: Three times a day (TID) | ORAL | Status: DC

## 2012-08-07 MED ORDER — CIPROFLOXACIN HCL 500 MG PO TABS: 500.0000 mg | ORAL_TABLET | Freq: Two times a day (BID) | ORAL | Status: DC

## 2012-08-07 MED ORDER — CEFTRIAXONE SODIUM 1 G IJ SOLR
1.0000 g | Freq: Once | INTRAMUSCULAR | Status: AC
  Administered 2012-08-07: 1 g via INTRAMUSCULAR

## 2012-08-07 NOTE — Progress Notes (Signed)
Quick Note:  WBC 15 - mild elevation Alone it is not predictive He has follow-up with Dr. Gerrit Friends 9/11  Let patient know ______

## 2012-08-07 NOTE — Progress Notes (Signed)
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  He was discharged from hospital a couple weeks ago, finishing his oral antibiotics 4 days ago with progressive malaise and abdominal pain since.  F/Hx:  Sister is dying of Crohns, Mother also had Crohns  No vomiting.  Objective:  Appears pale and wasted Alert and cooperative, ambulatory Abdomen:  Fresh midline surgical scar, well-healed.  Abdomen without significant rebound, but he is guarding on right side and has significant pain with palpation of right lower quadrant Results for orders placed in visit on 08/07/12  POCT CBC      Component Value Range   WBC 15.1 (*) 4.6 - 10.2 K/uL   Lymph, poc 2.1  0.6 - 3.4   POC LYMPH PERCENT 13.6  10 - 50 %L   MID (cbc) 0.7  0 - 0.9   POC MID % 4.7  0 - 12 %M   POC Granulocyte 12.3 (*) 2 - 6.9   Granulocyte percent 81.7 (*) 37 - 80 %G   RBC 4.43 (*) 4.69 - 6.13 M/uL   Hemoglobin 11.9 (*) 14.1 - 18.1 g/dL   HCT, POC 40.9 (*) 81.1 - 53.7 %   MCV 87.8  80 - 97 fL   MCH, POC 26.9 (*) 27 - 31.2 pg   MCHC 30.6 (*) 31.8 - 35.4 g/dL   RDW, POC 91.4     Platelet Count, POC 463 (*) 142 - 424 K/uL   MPV 7.4  0 - 99.8 fL    Assessment:  Crohn's disease, with post-op ileitis and possible early abscess formation  Plan: I spoke with Dr. Arlyce Dice of GI who suggested that we start outpatient Flagyl and Cipro, get a CT of the abdomen with office visit with Dr. Leone Payor tomorrow. 1. Abdominal pain  POCT CBC, Sedimentation Rate, CT Abdomen Pelvis W Wo Contrast, Comprehensive metabolic panel, cefTRIAXone (ROCEPHIN) injection 1 g, oxyCODONE-acetaminophen (ROXICET) 5-325 MG per tablet, ciprofloxacin (CIPRO) 500 MG tablet, metroNIDAZOLE (FLAGYL) 500 MG tablet   We need a plan to work on remission of Crohn's.  Quandre has stopped smoking and almost quit drinking, but he will need guidance and close followup

## 2012-08-08 ENCOUNTER — Telehealth: Payer: Self-pay | Admitting: Internal Medicine

## 2012-08-08 ENCOUNTER — Telehealth: Payer: Self-pay | Admitting: Radiology

## 2012-08-08 LAB — COMPREHENSIVE METABOLIC PANEL
ALT: 71 U/L — ABNORMAL HIGH (ref 0–53)
AST: 36 U/L (ref 0–37)
Albumin: 3.4 g/dL — ABNORMAL LOW (ref 3.5–5.2)
Alkaline Phosphatase: 151 U/L — ABNORMAL HIGH (ref 39–117)
BUN: 15 mg/dL (ref 6–23)
CO2: 24 mEq/L (ref 19–32)
Calcium: 9.6 mg/dL (ref 8.4–10.5)
Chloride: 103 mEq/L (ref 96–112)
Creat: 0.87 mg/dL (ref 0.50–1.35)
Glucose, Bld: 105 mg/dL — ABNORMAL HIGH (ref 70–99)
Potassium: 4.5 mEq/L (ref 3.5–5.3)
Sodium: 140 mEq/L (ref 135–145)
Total Bilirubin: 0.3 mg/dL (ref 0.3–1.2)
Total Protein: 7.2 g/dL (ref 6.0–8.3)

## 2012-08-08 NOTE — Telephone Encounter (Signed)
Dr Juanda Chance, this is the patient we spoke about on 08/08/12 will you please see if Dr Leone Payor or Dr Arlyce Dice can see patient sooner than Friday? His contact # is [phone]

## 2012-08-08 NOTE — Telephone Encounter (Signed)
Dr Milus Glazier, I did speak to patient, he does have appt Friday with [facility], I have told him I will keep working on this Dr Juanda Chance is going to check and see if he can be worked in sooner than Friday. I have sent her a message also

## 2012-08-08 NOTE — Telephone Encounter (Signed)
I have sent a note to Dr Leone Payor

## 2012-08-08 NOTE — Telephone Encounter (Signed)
Samuel Montgomery, can you check on the referral for CT Scan?

## 2012-08-08 NOTE — Telephone Encounter (Signed)
Patient was to have a CT by the [facility] according to Dr. Leone Payor and Kaplan's note.  Patient advised to check with [facility]

## 2012-08-08 NOTE — Telephone Encounter (Signed)
Patient did call [facility]  and appt was offered for Friday at 11am. Has appt with surgeon Dr Lovell Sheehan tomorrow. He is asking about CT scan, I will ck on this also.

## 2012-08-08 NOTE — Telephone Encounter (Signed)
Patient was to be seen today at [facility], for concerns of abdominal pain. Dr Milus Glazier spoke to Dr Arlyce Dice about this. The patient called [facility] today and was referred back to [place]. Dr Milus Glazier has called to speak to someone. I have called and Dr Juanda Chance is paged for him.

## 2012-08-09 ENCOUNTER — Ambulatory Visit (INDEPENDENT_AMBULATORY_CARE_PROVIDER_SITE_OTHER): Payer: BC Managed Care – PPO | Admitting: Surgery

## 2012-08-09 ENCOUNTER — Encounter (INDEPENDENT_AMBULATORY_CARE_PROVIDER_SITE_OTHER): Payer: Self-pay | Admitting: Surgery

## 2012-08-09 VITALS — BP 126/68 | HR 70 | Temp 97.1°F | Resp 16 | Ht 64.0 in | Wt 143.0 lb

## 2012-08-09 DIAGNOSIS — K5080 Crohn's disease of both small and large intestine without complications: Secondary | ICD-10-CM

## 2012-08-09 LAB — SEDIMENTATION RATE: Sed Rate: 98 mm/h — ABNORMAL HIGH (ref 0–16)

## 2012-08-09 NOTE — Telephone Encounter (Signed)
Patient advised.  He has an appt today.

## 2012-08-09 NOTE — Telephone Encounter (Signed)
Sheri,  Tell him to go see Dr. Gerrit Friends today at 930 as scheduled.  He is still being followed post-op

## 2012-08-09 NOTE — Progress Notes (Signed)
General Surgery [facility]  Visit Diagnoses: 1. Crohn's ileocolitis     HISTORY: Patient returns for followup. He is 3 months out from ileocecectomy for complicated Crohn's disease. He continues to have problems with abdominal pain. He was seen earlier this week at urgent care and a CBC shows an elevated white blood cell count of 15,000. He is taking narcotic analgesics for pain. He is scheduled for a CT scan of the abdomen and pelvis. He is started back on Cipro and Flagyl currently. He is scheduled to followup with gastroenterology in 2 days.  EXAM: Abdomen is soft without distention. Midline incision is completely epithelialized. There is some laxity to the upper of dominant wall but no fascial defect or definite hernia. Right lower quadrant is mildly to moderately tender. No palpable mass. Bowel sounds are normal.  IMPRESSION: Crohn's disease, fistula in the distal ileum  PLAN: Patient will have a CT scan this week to evaluate the right lower quadrant abdominal pain. He will see Dr. Leone Payor and gastroenterology later this week. Certainly if there are any significant complications of his Crohn's disease, he may require further surgical intervention.  We will await the results of his CT scan and consultation with gastroenterology.  Velora Heckler, MD, FACS General & Endocrine Surgery [facility]

## 2012-08-10 ENCOUNTER — Other Ambulatory Visit: Payer: Self-pay | Admitting: *Deleted

## 2012-08-10 DIAGNOSIS — D729 Disorder of white blood cells, unspecified: Secondary | ICD-10-CM

## 2012-08-10 DIAGNOSIS — R109 Unspecified abdominal pain: Secondary | ICD-10-CM

## 2012-08-10 NOTE — Addendum Note (Signed)
Addended by: Carollee Leitz L on: 08/10/2012 03:07 PM   Modules accepted: Orders

## 2012-08-11 ENCOUNTER — Telehealth (INDEPENDENT_AMBULATORY_CARE_PROVIDER_SITE_OTHER): Payer: Self-pay

## 2012-08-11 ENCOUNTER — Encounter: Payer: Self-pay | Admitting: Internal Medicine

## 2012-08-11 ENCOUNTER — Ambulatory Visit (INDEPENDENT_AMBULATORY_CARE_PROVIDER_SITE_OTHER): Payer: BC Managed Care – PPO | Admitting: Internal Medicine

## 2012-08-11 ENCOUNTER — Ambulatory Visit
Admission: RE | Admit: 2012-08-11 | Discharge: 2012-08-11 | Disposition: A | Discharge status: Home / Self Care | Payer: BC Managed Care – PPO | Source: Ambulatory Visit | Attending: Family Medicine | Admitting: Family Medicine

## 2012-08-11 ENCOUNTER — Telehealth: Payer: Self-pay

## 2012-08-11 VITALS — BP 118/60 | HR 68 | Ht 64.0 in | Wt 144.4 lb

## 2012-08-11 DIAGNOSIS — K651 Peritoneal abscess: Secondary | ICD-10-CM

## 2012-08-11 DIAGNOSIS — K632 Fistula of intestine: Secondary | ICD-10-CM

## 2012-08-11 DIAGNOSIS — F411 Generalized anxiety disorder: Secondary | ICD-10-CM

## 2012-08-11 DIAGNOSIS — R109 Unspecified abdominal pain: Secondary | ICD-10-CM

## 2012-08-11 DIAGNOSIS — F419 Anxiety disorder, unspecified: Secondary | ICD-10-CM

## 2012-08-11 DIAGNOSIS — K5080 Crohn's disease of both small and large intestine without complications: Secondary | ICD-10-CM

## 2012-08-11 DIAGNOSIS — D729 Disorder of white blood cells, unspecified: Secondary | ICD-10-CM

## 2012-08-11 MED ORDER — SERTRALINE HCL 50 MG PO TABS: 50.0000 mg | ORAL_TABLET | Freq: Every day | ORAL | Status: DC

## 2012-08-11 MED ORDER — IOHEXOL 300 MG/ML  SOLN
100.0000 mL | Freq: Once | INTRAMUSCULAR | Status: AC | PRN
  Administered 2012-08-11: 100 mL via INTRAVENOUS

## 2012-08-11 NOTE — Telephone Encounter (Signed)
Pt is on his way to see dr Leone Payor now and would like to talk with dr Aaron Mose before he leaves today at 1 about releasing him to go back to work  Best number [phone]  dfb

## 2012-08-11 NOTE — Telephone Encounter (Signed)
Received request for refill hydrocodone 5-325 that the original was from 06-28-12. Chart reviewed and Dr Milus Glazier has given pt oxycodone on 08-07-12. [facility] notified that pt's narcotic refills should be directed to Dr Milus Glazier to prevent multiple MDs filling narcotic prescriptions. They will contact pt.

## 2012-08-11 NOTE — Patient Instructions (Addendum)
We have sent the following medications to your pharmacy for you to pick up at your convenience: Generic Zoloft  We will be in touch after Dr. Leone Payor talks with Dr. Gerrit Friends about your case.  Thank you for choosing me and [facility].  Iva Boop, M.D., [facility]

## 2012-08-11 NOTE — Telephone Encounter (Signed)
I called patient, I have spoken to him previously, left message for him to call me back.

## 2012-08-12 NOTE — Progress Notes (Signed)
[facility] Subjective:    Patient ID: Samuel Montgomery, male    DOB: 02/15/1956, 56 y.o.   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 He had been hospitalized after right hem-colectomy due to the inflammatory process in RLQ. Was on cipro and metronidazole as outpatient. Returned to work. Finished po antibiotics. Began to have increasing RLQ pain. ? fever He has WBC 15 K. He went to [facility] and eventually had a CT showing slight increase in the RLQ inflammation. Had ben started on cipro and metronidazole and improved.  Is anxious about his problems and repeatedly presses on abdomen. Weak and tired. Appetite off. Sleep fair. Admits anxiety. Wants to return to work again but cannot lift heavy items he thinks.  Bowel movements a bit looser and green since starting antibiotics again.  Medications, allergies, past medical history, past surgical history, family history and social history are reviewed and updated in the EMR.   Review of Systems As above    Objective:   Physical Exam General:  NAD Eyes:   anicteric Lungs:  clear Heart:  S1S2 no rubs, murmurs or gallops Abdomen:  soft and mildly tender in RLQ with fullness there, BS+ Ext:   no edema    Data Reviewed:   CT abd/pelvis this week  IMPRESSION:  Mild worsening of the inflammatory process in the right lower  quadrant. No contrast extravasation noted. Secondary wall  thickening within adjacent small bowel loops has increased as has  enlarged mesenteric lymph nodes.   Lab Results  Component Value Date   WBC 15.1* 08/07/2012   HGB 11.9* 08/07/2012   HCT 38.9* 08/07/2012   MCV 87.8 08/07/2012   PLT 399.0 08/07/2012         Assessment & Plan:   1. Crohn's ileocolitis   2. 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 Anxiety    1. Continue cipro and  metronidazole - probably x 1 month 2. Need to consider going ahead with biologic therapy, he says he is set up for Humira- probably wait to re-CT 1 month 3. Sertraline for anxiety/depressive sxs 4. Will discuss with Dr. Gerrit Friends 5. Return to work with 15# max lifting  WU:[id number], Stan Head, MD Caren Griffins, TODD

## 2012-08-14 ENCOUNTER — Ambulatory Visit (INDEPENDENT_AMBULATORY_CARE_PROVIDER_SITE_OTHER): Payer: BC Managed Care – PPO | Admitting: Family Medicine

## 2012-08-14 ENCOUNTER — Telehealth: Payer: Self-pay | Admitting: Internal Medicine

## 2012-08-14 VITALS — BP 134/75 | HR 69 | Temp 98.5°F | Resp 16 | Ht 64.25 in | Wt 144.4 lb

## 2012-08-14 DIAGNOSIS — K5090 Crohn's disease, unspecified, without complications: Secondary | ICD-10-CM

## 2012-08-14 DIAGNOSIS — R109 Unspecified abdominal pain: Secondary | ICD-10-CM

## 2012-08-14 MED ORDER — OXYCODONE-ACETAMINOPHEN 5-325 MG PO TABS: 1.0000 | ORAL_TABLET | Freq: Three times a day (TID) | ORAL | Status: DC | PRN

## 2012-08-14 MED ORDER — CIPROFLOXACIN HCL 500 MG PO TABS: 500.0000 mg | ORAL_TABLET | Freq: Two times a day (BID) | ORAL | Status: DC

## 2012-08-14 MED ORDER — METRONIDAZOLE 500 MG PO TABS: 500.0000 mg | ORAL_TABLET | Freq: Three times a day (TID) | ORAL | Status: DC

## 2012-08-14 NOTE — Telephone Encounter (Signed)
Error

## 2012-08-14 NOTE — Telephone Encounter (Signed)
GI Dr has released him to light duty

## 2012-08-14 NOTE — Progress Notes (Signed)
56 yo with Crohns who is gradually improving after a set back last week.  He has a fistula and persistent RLQ pain which is keeping him out of work.  He was told he could go back to work with 15 lb limitation on lifting which employer refuses.  Also, he is requiring ongoing narcotics for pain control.  He has quit smoking and drinking.  He continues to have diarrhea.  He called his surgeon for pain medication and was referred here.  Patient also called Dr. Leone Payor about the antibiotics and his office referred the patient here..  Objective: alert and ambulatory HEENT: no obvious abnormality Skin:  No rash Abdomen:  Hyperactive BS, mildly tender RLQ   Assessment:  This patient is still having signs of active Crohn's and is requiring pain medicine which precludes working.  This obviously is a longterm management situation and will need rechecks weekly until stable.  Plan:  Patient should be following up with Dr. Leone Payor.  If Dr. Leone Payor unavailable, patient will return here. 1. Abdominal pain  ciprofloxacin (CIPRO) 500 MG tablet, metroNIDAZOLE (FLAGYL) 500 MG tablet, oxyCODONE-acetaminophen (ROXICET) 5-325 MG per tablet

## 2012-08-17 ENCOUNTER — Other Ambulatory Visit (INDEPENDENT_AMBULATORY_CARE_PROVIDER_SITE_OTHER): Payer: BC Managed Care – PPO

## 2012-08-17 ENCOUNTER — Encounter: Payer: Self-pay | Admitting: Internal Medicine

## 2012-08-17 ENCOUNTER — Ambulatory Visit (INDEPENDENT_AMBULATORY_CARE_PROVIDER_SITE_OTHER): Payer: BC Managed Care – PPO | Admitting: Internal Medicine

## 2012-08-17 VITALS — BP 122/60 | HR 88 | Temp 98.2°F | Ht 64.25 in | Wt 142.0 lb

## 2012-08-17 DIAGNOSIS — F411 Generalized anxiety disorder: Secondary | ICD-10-CM

## 2012-08-17 DIAGNOSIS — K5080 Crohn's disease of both small and large intestine without complications: Secondary | ICD-10-CM

## 2012-08-17 DIAGNOSIS — R109 Unspecified abdominal pain: Secondary | ICD-10-CM

## 2012-08-17 DIAGNOSIS — F419 Anxiety disorder, unspecified: Secondary | ICD-10-CM

## 2012-08-17 DIAGNOSIS — K632 Fistula of intestine: Secondary | ICD-10-CM

## 2012-08-17 DIAGNOSIS — K651 Peritoneal abscess: Secondary | ICD-10-CM

## 2012-08-17 LAB — CBC WITH DIFFERENTIAL/PLATELET
Basophils Relative: 0.5 % (ref 0.0–3.0)
Eosinophils Absolute: 0.4 10*3/uL (ref 0.0–0.7)
Eosinophils Relative: 2.6 % (ref 0.0–5.0)
Hemoglobin: 12.1 g/dL — ABNORMAL LOW (ref 13.0–17.0)
Lymphocytes Relative: 12.2 % (ref 12.0–46.0)
MCHC: 32.8 g/dL (ref 30.0–36.0)
MCV: 86.3 fl (ref 78.0–100.0)
Monocytes Absolute: 1.2 10*3/uL — ABNORMAL HIGH (ref 0.1–1.0)
Neutro Abs: 12 10*3/uL — ABNORMAL HIGH (ref 1.4–7.7)
Neutrophils Relative %: 77.1 % — ABNORMAL HIGH (ref 43.0–77.0)
RBC: 4.28 Mil/uL (ref 4.22–5.81)
WBC: 15.6 10*3/uL — ABNORMAL HIGH (ref 4.5–10.5)

## 2012-08-17 MED ORDER — METRONIDAZOLE 500 MG PO TABS: 500.0000 mg | ORAL_TABLET | Freq: Three times a day (TID) | ORAL | Status: AC

## 2012-08-17 MED ORDER — CIPROFLOXACIN HCL 500 MG PO TABS: 500.0000 mg | ORAL_TABLET | Freq: Two times a day (BID) | ORAL | Status: AC

## 2012-08-17 NOTE — Progress Notes (Signed)
Quick Note:  WBC still 15 - at least its not worse - let him know ______

## 2012-08-17 NOTE — Progress Notes (Addendum)
Subjective:    Patient ID: Samuel Montgomery, male    DOB: Nov 17, 1956, 56 y.o.   MRN: [id number]  HPI The patient returns in followup of Crohn's ileocolitis with lower quadrant inflammatory changes presumed some component of abscess and an ileal ileal fistula. When he was last year he was also started on sertraline for anxiety and some mild depression symptoms. His sister is with him today.  In the interim since last week he ran out of his pain medications and end up seeing Dr. Milus Glazier. He had a refill of Percocet, he also ran out of his Cipro and metronidazole which I thought I have refilled and those were refilled as well.  His employer would not allow to work with a 15 pound weight restriction he is back on short-term disability and is looking into long-term disability through so security as well. He did sign up for long-term disability insurance at work but he is to weigh the year before he would be eligible for that.  His sister says he is somewhat calmer on the sertraline and he feels so as well.  Still having right lower quadrant abdominal pain and using his narcotic pain medicines. He has not had fever. He is not pressing on his abdominal wall as much as he was before, to check for tenderness. He feels somewhat better overall though he is not without symptoms.  Medications, allergies, past medical history, past surgical history, family history and social history are reviewed and updated in the EMR.   Review of Systems As above    Objective:   Physical Exam  well-developed well-nourished no acute distress Abdomen is soft, he has a healing lower midline scar, he has some fullness and mild tenderness in the right lower quadrant, bowel sounds are present Affect is appropriate, less anxious overall        Assessment & Plan:   1. Abdominal pain   2. Crohn's ileocolitis   3. Intra-abdominal abscess/inflammatory process  4. ileal-ileal fistula   He seems stable to improved. I plan  is for Cipro and metronidazole for about a month. After looking at things further, I think it's time to initiate biologic therapy. He says he has received some sort of discount card for Humira and I will have my nurse initiate that. He has had a negative serologic assay for TB as well as a skin test and he has had a negative hepatitis B surface antigen. I have reviewed the risks of biologic therapy with him previously.   Humira 160 mg [place] x 1, then 80 mcg x 2 weeks later then 40 mg every 2 weeks starting at week 4 # 1 starter kit and then monthly supply for 5 refills Does appear to have some sort of small abscess or inflammatory process in the right lower quadrant. Given the ileal to ileal fistula, and given the patient's and surgeon's (Dr. Gerrit Friends) desire to avoid repeat surgery, I think it is reasonable to pursue biologic therapy in the setting at this time. He has not had an obstruction that we know of so I think benefits of therapy outweigh risks at this time.  I would like to see him back the week of October 7 at which point would likely reimage him   He was given a prescription for Cipro and metronidazole to take her 10 more days after the prescriptions that run through September 26 run out.   Repeat CBC today   5. Anxiety   Improved, seem somewhat early for  sertraline effect but his sister thought he was better on this medication already.     Cc: Elvina Sidle, MD, Durwin Reges AND Darnell Level, MD

## 2012-08-17 NOTE — Patient Instructions (Addendum)
Your physician has requested that you go to the basement for the following lab work before leaving today: CBC.  Samuel Montgomery will be contacting you regarding starting Humira and scheduling a follow up appointment.

## 2012-08-18 ENCOUNTER — Other Ambulatory Visit: Payer: Self-pay

## 2012-08-18 MED ORDER — ADALIMUMAB 40 MG/0.8ML ~~LOC~~ KIT: PACK | SUBCUTANEOUS | Status: DC

## 2012-08-22 ENCOUNTER — Other Ambulatory Visit: Payer: Self-pay

## 2012-08-22 ENCOUNTER — Telehealth: Payer: Self-pay

## 2012-08-22 MED ORDER — ADALIMUMAB 40 MG/0.8ML ~~LOC~~ KIT: PACK | SUBCUTANEOUS | Status: DC

## 2012-08-22 MED ORDER — ADALIMUMAB 40 MG/0.8ML ~~LOC~~ KIT: PACK | SUBCUTANEOUS | Status: AC

## 2012-08-22 NOTE — Telephone Encounter (Signed)
Patient advised that Humira has been approved.  He reports he needs to find out how much it will cost.  He will call me to schedule teaching once he has the drug

## 2012-08-28 ENCOUNTER — Encounter: Payer: Self-pay | Admitting: Internal Medicine

## 2012-08-28 ENCOUNTER — Telehealth: Payer: Self-pay

## 2012-08-28 NOTE — Telephone Encounter (Signed)
My chart message from the patient:   How cool to be able to do this!   I should have my Humara Wednesday afternoon and will call Jasmine December when I have it. Should we do a white blood cell count before we do it?         Dr. Leone Payor please advise does he need labs?

## 2012-08-28 NOTE — Telephone Encounter (Signed)
I do not think another WBC is needed

## 2012-08-28 NOTE — Telephone Encounter (Signed)
No you do not need a WBC per Dr. Leone Payor.

## 2012-08-30 ENCOUNTER — Encounter: Payer: Self-pay | Admitting: Internal Medicine

## 2012-08-31 ENCOUNTER — Encounter: Payer: Self-pay | Admitting: Internal Medicine

## 2012-09-04 ENCOUNTER — Encounter: Payer: Self-pay | Admitting: Family Medicine

## 2012-09-05 ENCOUNTER — Telehealth: Payer: Self-pay | Admitting: Internal Medicine

## 2012-09-05 DIAGNOSIS — K5090 Crohn's disease, unspecified, without complications: Secondary | ICD-10-CM

## 2012-09-05 DIAGNOSIS — R109 Unspecified abdominal pain: Secondary | ICD-10-CM

## 2012-09-05 MED ORDER — OXYCODONE-ACETAMINOPHEN 5-325 MG PO TABS: 1.0000 | ORAL_TABLET | Freq: Three times a day (TID) | ORAL | Status: DC | PRN

## 2012-09-05 NOTE — Progress Notes (Unsigned)
Oxycodone refilled  No more antibiotics now  Needs CT abd/pelvis with contrast next week to follow-up the inflammation in and around small bowel / Crohn's and office visit with me sometime after that 1-2 weeks  I need to see him in office before deciding about work return and CT results will have some implication re: that

## 2012-09-05 NOTE — Telephone Encounter (Signed)
Please inform patient that we cannot refill oxycodone prescription by phone.  He will need to come in for this, but actually, his PCP should do this.

## 2012-09-05 NOTE — Progress Notes (Unsigned)
Dr. Leone Payor please see my chart message and questions from Samuel Montgomery  And advise

## 2012-09-05 NOTE — Progress Notes (Unsigned)
Patient advised of CT scan at [facility] for 09/13/12 1:00, [facility] 09/20/12 3:15.  He is also advised of Dr. Marvell Fuller recommendations

## 2012-09-05 NOTE — Telephone Encounter (Signed)
Dr. Milus Glazier- Please advise on this patient's request for refill of oxycodone.

## 2012-09-13 ENCOUNTER — Ambulatory Visit (INDEPENDENT_AMBULATORY_CARE_PROVIDER_SITE_OTHER)
Admission: RE | Admit: 2012-09-13 | Discharge: 2012-09-13 | Disposition: A | Discharge status: Home / Self Care | Payer: BC Managed Care – PPO | Source: Ambulatory Visit | Attending: Internal Medicine | Admitting: Internal Medicine

## 2012-09-13 DIAGNOSIS — K5090 Crohn's disease, unspecified, without complications: Secondary | ICD-10-CM

## 2012-09-13 DIAGNOSIS — R109 Unspecified abdominal pain: Secondary | ICD-10-CM

## 2012-09-13 MED ORDER — IOHEXOL 300 MG/ML  SOLN
100.0000 mL | Freq: Once | INTRAMUSCULAR | Status: AC | PRN
Start: 2012-09-13 — End: 2012-09-13
  Administered 2012-09-13: 100 mL via INTRAVENOUS

## 2012-09-14 ENCOUNTER — Encounter: Payer: Self-pay | Admitting: Internal Medicine

## 2012-09-14 NOTE — Telephone Encounter (Signed)
I have left a voicemail for the patient to call back to discuss further

## 2012-09-14 NOTE — Telephone Encounter (Signed)
Patient called back and left me a voicemail.  I have attempted to return his call again.

## 2012-09-14 NOTE — Telephone Encounter (Signed)
Left message for patient to call back  

## 2012-09-14 NOTE — Progress Notes (Signed)
Quick Note:  CT about the same - dx is active Crohn's Need to give Humira a chance If he does not have follow-up schedule for November ______

## 2012-09-14 NOTE — Telephone Encounter (Signed)
Patient walked into the office this afternoon.  He reports pain above his umbilicus.  He reports that it is a terrible pain that doubles him over. He has had this intermittently since Monday. He is having 6-12 semi liquid BMs a day.  He reports that he has been taking flexeril and oxycodone for the pain, but it doesn't really help.  He also reports that when he has tried to eat with the pain he has vomiting. I have asked him to come in and see Dr. Leone Payor tomorrow at 11:00

## 2012-09-15 ENCOUNTER — Other Ambulatory Visit: Payer: Self-pay | Admitting: Internal Medicine

## 2012-09-15 ENCOUNTER — Other Ambulatory Visit: Payer: BC Managed Care – PPO

## 2012-09-15 ENCOUNTER — Encounter: Payer: Self-pay | Admitting: Internal Medicine

## 2012-09-15 ENCOUNTER — Ambulatory Visit (INDEPENDENT_AMBULATORY_CARE_PROVIDER_SITE_OTHER): Payer: BC Managed Care – PPO | Admitting: Internal Medicine

## 2012-09-15 VITALS — BP 116/56 | HR 80 | Ht 64.0 in | Wt 144.0 lb

## 2012-09-15 DIAGNOSIS — K5080 Crohn's disease of both small and large intestine without complications: Secondary | ICD-10-CM

## 2012-09-15 DIAGNOSIS — R109 Unspecified abdominal pain: Secondary | ICD-10-CM

## 2012-09-15 MED ORDER — GLYCOPYRROLATE 2 MG PO TABS: 2.0000 mg | ORAL_TABLET | Freq: Two times a day (BID) | ORAL | Status: DC | PRN

## 2012-09-15 MED ORDER — OXYCODONE-ACETAMINOPHEN 5-325 MG PO TABS: 1.0000 | ORAL_TABLET | Freq: Three times a day (TID) | ORAL | Status: AC | PRN

## 2012-09-15 NOTE — Patient Instructions (Addendum)
You have been given a separate informational sheet regarding your tobacco use, the importance of quitting and local resources to help you quit.  Your physician has requested that you go to the basement for the following lab work before leaving today: TPMT enzyme activity test  We have sent the following medications to your pharmacy for you to pick up at your convenience: Robinul   We have given you a written Rx today for Roxicet 5-325mg , [facility].  We have given you a letter to be out of work until your next appointment in a month.    Thank you for choosing me and [facility].   Iva Boop, M.D., [facility]

## 2012-09-15 NOTE — Progress Notes (Signed)
Subjective:    Patient ID: Samuel Montgomery, male    DOB: 1956/09/29, 56 y.o.   MRN: [id number]  HPI Samuel Montgomery returns for followup of his Crohn's ileocolitis, status post right hemicolectomy with ileal resection earlier this year. His CT scan completed this week demonstrates persistent inflammatory change and a fistula in the right lower quadrant in the region of the small bowel. He continues to have intermittent abdominal pain using Percocet every 6-8 hours. He needs a refill he has a few left. He describes intermittent bloating and distention and crampy pain that lasts for minutes to many hours overall. He is able to eat. He has vomited 3 times in the last several weeks. In general he is not having nausea and vomiting though. Bowel movements will be normal form though smaller, or loose at times. He has not had fever. He still has a lot of abdominal pain when he lifts or moves things and is unable to work he says. He is very tired. He is not sure if the Zoloft is helping yet. Was started a month ago. He has had 2 doses of Humira at this point.  Medications, allergies, past medical history, past surgical history, family history and social history are reviewed and updated in the EMR.   Review of Systems As per history of present illness. He is applying for disability.    Objective:   Physical Exam General:  NAD Eyes:   anicteric Lungs:  clear Heart:  S1S2 no rubs, murmurs or gallops Abdomen:  There is a low midline scar. There is a small ventral hernia at the top of this, with exquisite tenderness even to light touch. It is soft and reducible however. Bowel sounds are present. There is mild discomfort in the right lower quadrant but no mass. Ext:   no edema    Data Reviewed: CT scan abdomen and pelvis with contrast 09/14/2012 - images reviewed with the patient IMPRESSION:  No substantial interval change exam. There is a persistent  inflammatory process in the mesentery of the right lower  quadrant,  tethering in multiple loops of small bowel and the entero-colic  anastomosis. There appears to be some tiny collections of debris  and gas within the mesenteric between the loops consistent with the  previously opacified fistula. There is no evidence for contrast  flow in the fistula on today's study.  Persistent mild diffuse distention of bowel proximal to the entero-  colic anastomosis. There is some narrowing of small bowel loops in  the region of inflammation I suspect some inflammatory stricturing  is resulting and partial obstruction, but the contrast material  does reach the colon.      Assessment & Plan:   1. Crohn's ileocolitis   2. Abdominal pain    He has ongoing inflammatory change in fistula formation in the right lower quadrant area, involving the small bowel. This is status post his right hemicolectomy surgery this summer. This seems to be active Crohn's disease as opposed to a postoperative phenomenon though it did occur within the postoperative period. He is stable radiographically and symptomatically he is probably a little bit better. Humira was just initiated. He has had several weeks of high-dose Cipro and metronidazole.  1. We'll continue Humira and I want to start an immune modulator and will check his TPMT enzyme activity prior to starting this. I reviewed the indications and risks of these medications with him. Additive therapy should be better. 2. Avoid prednisone at all costs and this patient  with histologic Crohn's disease and inflammatory mass. 3. Return visit in one month, I think is reasonable that he stay out of work I don't think he can perform his job. Letter written. 4. I explained that he may and upper quadrant other surgery  but we are trying to avoid that 5. Refill Percocet today #60 6. Start glycopyrrolate 2 mg twice a day as needed to see if that provides pain relief for these cramps.  CC: Samuel Montgomery, Samuel Head, MD and Samuel Gerrit Friends, MD

## 2012-09-19 ENCOUNTER — Telehealth: Payer: Self-pay | Admitting: Internal Medicine

## 2012-09-19 ENCOUNTER — Ambulatory Visit (INDEPENDENT_AMBULATORY_CARE_PROVIDER_SITE_OTHER)
Admission: RE | Admit: 2012-09-19 | Discharge: 2012-09-19 | Disposition: A | Discharge status: Home / Self Care | Payer: BC Managed Care – PPO | Source: Ambulatory Visit | Attending: Internal Medicine | Admitting: Internal Medicine

## 2012-09-19 ENCOUNTER — Other Ambulatory Visit: Payer: Self-pay

## 2012-09-19 DIAGNOSIS — K5090 Crohn's disease, unspecified, without complications: Secondary | ICD-10-CM

## 2012-09-19 DIAGNOSIS — R109 Unspecified abdominal pain: Secondary | ICD-10-CM

## 2012-09-19 MED ORDER — PREDNISONE 10 MG PO TABS: 40.0000 mg | ORAL_TABLET | Freq: Every day | ORAL | Status: DC

## 2012-09-19 NOTE — Progress Notes (Signed)
Quick Note:  The xray suggests some obstruction - since he is moving his bowels and not vomiting - does not need hospitalization but should be on a liquid diet with follow-up xray in AM tomorrow and follow-up appt after that me or NP/PA -   Go ahead and start prednisone 40 mg daily also #100 10 mg tabs 1 refill ______

## 2012-09-19 NOTE — Telephone Encounter (Signed)
Patient reports that glycopyrrolate is not helping, but he is only using it on a PRN basis.  He notes it does not help when the cramping starts.  He reports that he is having small formed BM.  He is asking if he should start on some laxatives as he feels constipated.  He is taking a stool softener.  He is eating a low residue diet.  Denies fever, rectal bleeding, nausea, or vomiting.  Dr. Leone Payor please advise

## 2012-09-19 NOTE — Telephone Encounter (Signed)
Patient aware.  He will come today for x-ray

## 2012-09-19 NOTE — Telephone Encounter (Signed)
2 view abdomen re abd pain and crohn's

## 2012-09-20 ENCOUNTER — Ambulatory Visit (INDEPENDENT_AMBULATORY_CARE_PROVIDER_SITE_OTHER): Payer: BC Managed Care – PPO | Admitting: Internal Medicine

## 2012-09-20 ENCOUNTER — Ambulatory Visit (INDEPENDENT_AMBULATORY_CARE_PROVIDER_SITE_OTHER)
Admission: RE | Admit: 2012-09-20 | Discharge: 2012-09-20 | Disposition: A | Discharge status: Home / Self Care | Payer: BC Managed Care – PPO | Source: Ambulatory Visit | Attending: Internal Medicine | Admitting: Internal Medicine

## 2012-09-20 ENCOUNTER — Encounter: Payer: Self-pay | Admitting: Internal Medicine

## 2012-09-20 ENCOUNTER — Ambulatory Visit: Payer: BC Managed Care – PPO | Admitting: Internal Medicine

## 2012-09-20 VITALS — BP 102/60 | HR 68 | Ht 64.0 in | Wt 142.2 lb

## 2012-09-20 DIAGNOSIS — K5080 Crohn's disease of both small and large intestine without complications: Secondary | ICD-10-CM

## 2012-09-20 DIAGNOSIS — K56609 Unspecified intestinal obstruction, unspecified as to partial versus complete obstruction: Secondary | ICD-10-CM

## 2012-09-20 DIAGNOSIS — K5090 Crohn's disease, unspecified, without complications: Secondary | ICD-10-CM

## 2012-09-20 DIAGNOSIS — K56600 Partial intestinal obstruction, unspecified as to cause: Secondary | ICD-10-CM

## 2012-09-20 NOTE — Progress Notes (Signed)
  Subjective:    Patient ID: Samuel Montgomery, male    DOB: October 24, 1956, 56 y.o.   MRN: [id number]  HPI Over the last few days had increasing abdominal pain that became severe- mid and upper left quadrant areas. Had not moved bowels well and had some vomiting. No bleeding. Glycopyrrolate did not help nor did oxycodone. Xray yesterday with obstructive pattern. Prednisone x 40 mg x 1 Then had good defecation yesterday and feels  ok without pain today. Was on liquids overnight and ate egg this AM.  Medications, allergies, past medical history, past surgical history, family history and social history are reviewed and updated in the EMR.  Review of Systems No fever    Objective:   Physical Exam General:  NAD Eyes:   anicteric Abdomen:  soft and nontender, BS+, incisional hernia Ext:   no edema Skin:  Mild erythema knuckles and elbows    Data Reviewed:  2 view abd yesterday and today Mildly dilated small bowel loops in LUQ - images viewed with patient     Assessment & Plan:   1. Crohn's ileocolitis   2. Partial bowel obstruction ?   1. Probable partial SBO +/- constipation from meds 2. Try MiraLax 3. Stop prednisone because of psoriasis (can cause flare when stopping) and to try to avoid abscess 4. Follow-up as planned  Cc: Darnell Level, MD

## 2012-09-20 NOTE — Patient Instructions (Addendum)
Take Miralax everyday per Dr. Leone Payor.  You may use more or less of the miralax depending on the effect.  Stop your Prednisone.  Thank you for choosing me and [facility].  Iva Boop, M.D., [facility]

## 2012-09-22 ENCOUNTER — Telehealth: Payer: Self-pay | Admitting: Internal Medicine

## 2012-09-22 NOTE — Telephone Encounter (Signed)
Patient called back a few minutes ago to report that he has had vomiting this pm and his abdominal cramping has returned.  He is advised to place himself on a clear liquid diet, use his phenergan and pain meds PRN.  If the pain does not subside, or worsens,  or he is unable to tolerate liquids he will need ER evaluation. He verbalized understanding.

## 2012-09-22 NOTE — Telephone Encounter (Signed)
Patient reports that he has not had a BM since starting on Miralax earlier this week.  Patient was seen by Dr. Leone Payor 09/20/12 with presumed partial small bowel obstruction +/- constipation, he has a history of crohn's dz and has recently started on Humira.  Patient was asked to start on Miralalx daily by Dr. Leone Payor .  His abdominal pain and cramping has improved.  He is tolerating a diet without nausea or vomiting and states she feels better.  His concern is he has not had a BM.  He is asked to make sure he is drinking plenty of fluid and increase the miralax to BID as tolerated.  Dr. Russella Dar please review as MD of the day for Dr. Leone Payor

## 2012-09-22 NOTE — Telephone Encounter (Signed)
Agree with plans

## 2012-09-26 ENCOUNTER — Encounter: Payer: Self-pay | Admitting: Internal Medicine

## 2012-09-26 LAB — THIOPURINE METHYLTRANSFERASE (TPMT), RBC

## 2012-09-26 NOTE — Progress Notes (Signed)
Quick Note:  Please start 6 MP 75 mg daily on Migel # 61month supply and 1 refill Needs CBC and LFT's in 2 weeks also  ______

## 2012-09-27 ENCOUNTER — Other Ambulatory Visit: Payer: Self-pay

## 2012-09-27 DIAGNOSIS — Z79899 Other long term (current) drug therapy: Secondary | ICD-10-CM

## 2012-09-27 DIAGNOSIS — K5090 Crohn's disease, unspecified, without complications: Secondary | ICD-10-CM

## 2012-09-27 MED ORDER — MERCAPTOPURINE 50 MG PO TABS: 75.0000 mg | ORAL_TABLET | Freq: Every day | ORAL | Status: DC

## 2012-10-02 ENCOUNTER — Telehealth: Payer: Self-pay | Admitting: Internal Medicine

## 2012-10-02 MED ORDER — LORAZEPAM 1 MG PO TABS: 2.0000 mg | ORAL_TABLET | Freq: Four times a day (QID) | ORAL | Status: DC | PRN

## 2012-10-02 NOTE — Telephone Encounter (Signed)
Patient advised.  [facility]

## 2012-10-02 NOTE — Telephone Encounter (Signed)
Let's try lorazepam 2 mg every 6 hours as needed for anxiety and abdominal "distress"  Explain to him it can help the GI tract also  #120 no refills

## 2012-10-02 NOTE — Telephone Encounter (Signed)
Patient having continued abdominal cramping.  He has used 60 Robinul since the 09/15/12, he reports that oxycodone does not help the cramping either.  He has been taking Robinul 3 times a day.  He reports that the cramping is intermittent about every 6-8 hours and lasts for about an hour at a time. He has been on a liquid diet for the last several days.  He has not started on 6 MP due to the death of his sister. He plans to pick it up today and get started on it.  Is there an alternative to Robinul?  Dr. Leone Payor please advise

## 2012-10-03 ENCOUNTER — Other Ambulatory Visit: Payer: Self-pay | Admitting: *Deleted

## 2012-10-03 ENCOUNTER — Encounter: Payer: Self-pay | Admitting: Internal Medicine

## 2012-10-03 ENCOUNTER — Telehealth: Payer: Self-pay | Admitting: *Deleted

## 2012-10-03 NOTE — Telephone Encounter (Signed)
Called [facility] and spoke to the pharmacist.  He said they are in the process of filling the Lorazepam 2 mg, # 120/ 0 refills.   I called Samuel Montgomery and advised him the pharmacist is filling his prescription right now.  This was at 1:35 PM.

## 2012-10-13 ENCOUNTER — Other Ambulatory Visit (INDEPENDENT_AMBULATORY_CARE_PROVIDER_SITE_OTHER): Payer: BC Managed Care – PPO

## 2012-10-13 DIAGNOSIS — Z79899 Other long term (current) drug therapy: Secondary | ICD-10-CM

## 2012-10-13 DIAGNOSIS — K5090 Crohn's disease, unspecified, without complications: Secondary | ICD-10-CM

## 2012-10-13 LAB — CBC WITH DIFFERENTIAL/PLATELET
Basophils Relative: 0.5 % (ref 0.0–3.0)
HCT: 40.1 % (ref 39.0–52.0)
Hemoglobin: 13.2 g/dL (ref 13.0–17.0)
Lymphocytes Relative: 12.9 % (ref 12.0–46.0)
Lymphs Abs: 2.1 10*3/uL (ref 0.7–4.0)
MCHC: 32.9 g/dL (ref 30.0–36.0)
Monocytes Relative: 6.1 % (ref 3.0–12.0)
Neutro Abs: 12.4 10*3/uL — ABNORMAL HIGH (ref 1.4–7.7)
RBC: 4.66 Mil/uL (ref 4.22–5.81)

## 2012-10-13 LAB — HEPATIC FUNCTION PANEL
AST: 24 U/L (ref 0–37)
Albumin: 3.5 g/dL (ref 3.5–5.2)
Alkaline Phosphatase: 92 U/L (ref 39–117)
Total Protein: 7.4 g/dL (ref 6.0–8.3)

## 2012-10-13 NOTE — Progress Notes (Signed)
Quick Note:  Labs ok I let him know via My chart Will need to repeat in 2 weeks - will order at [facility] next week ______

## 2012-10-17 ENCOUNTER — Encounter: Payer: Self-pay | Admitting: Internal Medicine

## 2012-10-17 ENCOUNTER — Ambulatory Visit (INDEPENDENT_AMBULATORY_CARE_PROVIDER_SITE_OTHER): Payer: BC Managed Care – PPO | Admitting: Internal Medicine

## 2012-10-17 ENCOUNTER — Telehealth: Payer: Self-pay | Admitting: Internal Medicine

## 2012-10-17 VITALS — BP 114/70 | HR 60 | Ht 64.0 in | Wt 138.2 lb

## 2012-10-17 DIAGNOSIS — F17200 Nicotine dependence, unspecified, uncomplicated: Secondary | ICD-10-CM

## 2012-10-17 DIAGNOSIS — K5080 Crohn's disease of both small and large intestine without complications: Secondary | ICD-10-CM

## 2012-10-17 DIAGNOSIS — R109 Unspecified abdominal pain: Secondary | ICD-10-CM

## 2012-10-17 DIAGNOSIS — Z79899 Other long term (current) drug therapy: Secondary | ICD-10-CM

## 2012-10-17 DIAGNOSIS — F329 Major depressive disorder, single episode, unspecified: Secondary | ICD-10-CM

## 2012-10-17 DIAGNOSIS — F419 Anxiety disorder, unspecified: Secondary | ICD-10-CM

## 2012-10-17 DIAGNOSIS — F3289 Other specified depressive episodes: Secondary | ICD-10-CM

## 2012-10-17 DIAGNOSIS — F411 Generalized anxiety disorder: Secondary | ICD-10-CM

## 2012-10-17 MED ORDER — OXYCODONE HCL 5 MG PO TABS: 5.0000 mg | ORAL_TABLET | ORAL | Status: DC | PRN

## 2012-10-17 MED ORDER — BUPROPION HCL ER (XL) 150 MG PO TB24: 150.0000 mg | ORAL_TABLET | Freq: Two times a day (BID) | ORAL | Status: DC

## 2012-10-17 NOTE — Telephone Encounter (Signed)
Pt advised that oxycodone rx up front for pick up.  He will come by tomorrow.

## 2012-10-17 NOTE — Telephone Encounter (Signed)
Please advise on refill, thank you Sir.

## 2012-10-17 NOTE — Telephone Encounter (Signed)
I will refill

## 2012-10-17 NOTE — Patient Instructions (Addendum)
You have been given a separate informational sheet regarding your tobacco use, the importance of quitting and local resources to help you quit.  Please try and stop smoking.  Your physician has requested that you go to the basement for labs end of November early December.  No appointment needed, open 7:30am-5:30pm.  Take one dose of Miralax daily as needed.   Call us back mid December with an update and will decide if CT needed.  Follow up appointment needed in Jan. 2014.  Thank you for choosing me and [facility].  Iva Boop, M.D., [facility]

## 2012-10-17 NOTE — Progress Notes (Signed)
  Subjective:    Patient ID: Samuel Montgomery, male    DOB: 1956/09/10, 56 y.o.   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 He is now on Humira and . He was started on lorazepam. He has stopped Zoloft. His friend, a retired Char[employer] fundraiser is here also. She reports that he is getting 1000-1500 calories in a day. He is not vomiting but continues with upper abdominal distention and pain at times. RLQ pain is better. He can feel and see peristalsis. Is moving his bowels and takes MiraLax if he has not defecated after second cup of coffee in AM. He is concerned about the laxity in abdominal wall and the ventral hernia. He gets sore in his abdomen when driving or riding motorcycle. He is still unable to maintain physical activity for more than a brief period due to pain and fatigue. Lorazepam helps him more than glycopyrrolate does. Not using glycopyrrolate.  His sister did die about 2 weeks ago, complications of COPD but she also had severe Crohn's disease.  He is asking about using Wellbutrin for depression and to help him quit smoking (he is trying)  Medications, allergies, past medical history, past surgical history, family history and social history are reviewed and updated in the EMR.  Review of Systems ? Less anxious and less depressed     Objective:   Physical Exam General:  NAD Eyes:   anicteric Lungs:  clear Heart:  S1S2 no rubs, murmurs or gallops Abdomen:  soft and nontender, BS+ , + ventral hernia Ext:   no edema    Data Reviewed:  Lab Results  Component Value Date   WBC 16.0* 10/13/2012   HGB 13.2 10/13/2012   HCT 40.1 10/13/2012   MCV 86.1 10/13/2012   PLT 391.0 10/13/2012     Chemistry      Component Value Date/Time   NA 140 08/07/2012 2022   K 4.5 08/07/2012 2022   CL 103 08/07/2012 2022   CO2 24 08/07/2012 2022   BUN 15 08/07/2012 2022   CREATININE 0.87 08/07/2012 2022   CREATININE 0.70 07/17/2012 0555      Component Value Date/Time   CALCIUM 9.6 08/07/2012 2022     ALKPHOS 92 10/13/2012 1205   AST 24 10/13/2012 1205   ALT 36 10/13/2012 1205   BILITOT 0.4 10/13/2012 1205        Assessment & Plan:   1. Crohn's ileocolitis    2. Smoking    3. Depression    4. Anxiety    5. Long-term use of immunosuppressant medication  Hepatic function panel, CBC with Differential  6. Abdominal pain     1. Improved some, at least overall. Continue Humira and with follow-up labs 2. He is to call back in mid-Dec with symptom update and we will decide about repeat imaging with CT then, if needed do before 12/31 3. I have filled out disability forms recently, I believe he is temporarily unable to work due to illness. 4. Wellbutrin Rx 5. Refill oxycodone 6. See me in January, sooner prn 7. OK to refill lorazepam if needed in future 8. Not clear if he will make it without surgery but too soon to brand him a medical therapy failure. I do believe he has partial SBO issues on an intermittent basis. 9. Not sure if he has had influenza vaccination - will call him to be sure and recommend  Cc: Darnell Level, MD and Lucilla Edin, MD

## 2012-10-18 ENCOUNTER — Ambulatory Visit (INDEPENDENT_AMBULATORY_CARE_PROVIDER_SITE_OTHER): Payer: BC Managed Care – PPO | Admitting: Internal Medicine

## 2012-10-18 DIAGNOSIS — Z23 Encounter for immunization: Secondary | ICD-10-CM

## 2012-10-25 ENCOUNTER — Encounter: Payer: Self-pay | Admitting: Internal Medicine

## 2012-10-25 ENCOUNTER — Telehealth: Payer: Self-pay | Admitting: Internal Medicine

## 2012-10-25 MED ORDER — POLYETHYLENE GLYCOL 3350 17 GM/SCOOP PO POWD: 17.0000 g | Freq: Every day | ORAL | Status: DC

## 2012-10-25 NOTE — Telephone Encounter (Signed)
Reviewed the medications with him.  He wants a refill on Miralax

## 2012-10-28 ENCOUNTER — Encounter: Payer: Self-pay | Admitting: Family Medicine

## 2012-10-28 ENCOUNTER — Ambulatory Visit (INDEPENDENT_AMBULATORY_CARE_PROVIDER_SITE_OTHER): Payer: BC Managed Care – PPO | Admitting: Family Medicine

## 2012-10-28 VITALS — BP 120/68 | HR 63 | Temp 98.4°F | Resp 16 | Ht 65.4 in | Wt 131.6 lb

## 2012-10-28 DIAGNOSIS — K5090 Crohn's disease, unspecified, without complications: Secondary | ICD-10-CM

## 2012-10-28 NOTE — Progress Notes (Signed)
56 yo who is here for follow-up of Crohn's disease after surgery in June.  He is applying for long-term disability. The Crohn's came on him this year acutely and rapidly progressed to needing surgery.   Crohn's disease:  Unacceptable symptomatology at this point.  Cannot even ride his motorcycle.   A. On Humira, he's feeling better.  He's been taking it 2 months now.  B. Still Having cramps  C. At odds with Dr. Leone Payor  D. Still taking hydrocodone  It is unclear if certain foods are problematic. Abdominal wall hernia is sore, especially when coughing or sneezing Has almost quit.  Objective:  Alert, animated.  Spent 40 minutes listening and considering options. Chest:  Clear Heart:  Regular, no murmur Abdomen:  Well healed midline surgical scar with palpable vertical midline hernia which reduces on lying down  Borborygmi, mildly tender diffusely, no masses, no HSM Skin:  No rashes  Assessment:  Crohn's disease, not adequately controlled, but improved.  Plan:  It seems reasonable to get a second opinion at [facility] [place]        OPERATIVE REPORT  PREOPERATIVE DIAGNOSES: Crohn's disease with perforation of terminal  ileum, intraabdominal abscesses.  POSTOPERATIVE DIAGNOSES: same  PROCEDURES:  1. Exploratory laparotomy.  2. Ileocecectomy.  3. Drainage of intraabdominal abscess.  SURGEON: Velora Heckler, MD, FACS  ASSISTANT: Thornton Park. Daphine Deutscher, MD, FACS  ANESTHESIA: General per Dr. Lucille Passy.  ESTIMATED BLOOD LOSS: 100 cc.  PREPARATION: ChloraPrep.  COMPLICATIONS: None.  INDICATIONS: The patient is a 56 year old, white male, with known Crohn's  ileocolitis. The patient was admitted to the Gastroenterology Service  on May 08, 2012, with abdominal pain. CT scan showed a few loculations  of air and fluid, worrisome for fistula formation versus perforation.  The patient was placed at bowel rest and started on intravenous  antibiotics. He had slow gradual improvement for 5  days. However, the  patient's white blood cell count became elevated and pain did not  completely resolve. Therefore, a repeat CT scan was carried out on May 13, 2012. This showed evidence of free extravasation of contrast  consistent with perforation and multiple intraabdominal abscesses. The  patient was prepared and is now brought to the operating room for  exploration and ileocecectomy.  BODY OF REPORT: Procedure was done in OR #1 at the [facility]. The patient was brought to the operating room,  placed in a supine position on the operating room table. Following  administration of general anesthesia, the patient was positioned and  then prepped and draped in usual strict aseptic fashion. Palpation of  the abdomen shows a large inflammatory mass in the right lower quadrant  which was palpable through the abdominal wall.  Midline incision was made with a #10 blade. Dissection was carried  through subcutaneous tissues and hemostasis obtained with  electrocautery. Fascia was incised in the midline and the peritoneal  cavity was entered cautiously. Upon entering the peritoneal cavity,  there was a large amount of free fluid. This measures 500-700 cc, which  was evacuated. There were multiple interloop collections which when  broken up show green purulent fluid which was evacuated. Omentum was  adherent to the inflammatory process as well as to the sigmoid colon.  The incision was extended slightly. Omentum was mobilized. The omentum  was transected where it was densely adherent to the cecum and terminal  ileum. It was transected using the LigaSure device for hemostasis.  Small bowel was delivered out  of the pelvis or retroperitoneum. It was  followed distally where there was apparent involvement with Crohn  ileitis extending to the cecum. There were dense adhesions in the right  lower quadrant to the abdominal wall and retroperitoneum, partially due  to chronic  inflammation, but also likely due to the patient's previous  surgical history of appendectomy. Sharp dissection was employed to  mobilize the lateral wall of the cecum from the abdominal wall. Right  colon was mobilized along the peritoneal reflection and hepatic flexure  was taken down with the electrocautery and used for hemostasis. This  provides good mobilization of the entire right colon.  A point was selected in the distal small bowel, probably 25-30 cm from  the ileocecal valve to transect the small bowel above the level of  active disease. This was performed with a GIA stapler. A point in the  ascending colon just above the cecum was selected and also transected  with a GIA stapler. Small bowel and colonic mesentery was divided with  the ligature and reinforced with 2-0 silk figure-of-8 suture ligatures.  The specimen was excised in its entirety and submitted to pathology for  review.  A side-to-side anastomosis was created between the ileum and the  ascending colon. This was performed with a GIA stapler. Good  hemostasis was noted along the staple line. No active mucosal disease  was identified on inspection. Enterotomy was closed with a TA-60  stapler. Anastomosis was tested with gentle pressure and there was no  evidence of leakage. Mesenteric defect was closed with interrupted 2-0  silk sutures.  Gauze were changed. Abdomen was copiously irrigated with warm saline  which was evacuated. This returned to the peritoneal cavity and covered  with the omentum. Good hemostasis was noted. All sponge counts were  correct.  Midline incision was closed with interrupted #1 Novafil simple sutures.  Subcutaneous tissues were irrigated. Skin was loosely approximated with  stainless steel staples and subcutaneous tissues were packed with 4 x 4  gauze sponges saturated with Betadine solution. Sterile dressings were  applied. The patient was awakened from anesthesia and brought to the    recovery room. The patient tolerated the procedure well.  Velora Heckler, MD, [facility]  Office: [phone]

## 2012-10-28 NOTE — Patient Instructions (Addendum)
Try probiotic Try Lactaid   OPERATIVE REPORT  PREOPERATIVE DIAGNOSES: Crohn's disease with perforation of terminal  ileum, intraabdominal abscesses.  POSTOPERATIVE DIAGNOSES: same  PROCEDURES:  1. Exploratory laparotomy.  2. Ileocecectomy.  3. Drainage of intraabdominal abscess.  SURGEON: Velora Heckler, MD, FACS  ASSISTANT: Thornton Park. Daphine Deutscher, MD, FACS  ANESTHESIA: General per Dr. Lucille Passy.  ESTIMATED BLOOD LOSS: 100 cc.  PREPARATION: ChloraPrep.  COMPLICATIONS: None.  INDICATIONS: The patient is a 56 year old, white male, with known Crohn's  ileocolitis. The patient was admitted to the Gastroenterology Service  on May 08, 2012, with abdominal pain. CT scan showed a few loculations  of air and fluid, worrisome for fistula formation versus perforation.  The patient was placed at bowel rest and started on intravenous  antibiotics. He had slow gradual improvement for 5 days. However, the  patient's white blood cell count became elevated and pain did not  completely resolve. Therefore, a repeat CT scan was carried out on May 13, 2012. This showed evidence of free extravasation of contrast  consistent with perforation and multiple intraabdominal abscesses. The  patient was prepared and is now brought to the operating room for  exploration and ileocecectomy.  BODY OF REPORT: Procedure was done in OR #1 at the [facility]. The patient was brought to the operating room,  placed in a supine position on the operating room table. Following  administration of general anesthesia, the patient was positioned and  then prepped and draped in usual strict aseptic fashion. Palpation of  the abdomen shows a large inflammatory mass in the right lower quadrant  which was palpable through the abdominal wall.  Midline incision was made with a #10 blade. Dissection was carried  through subcutaneous tissues and hemostasis obtained with  electrocautery. Fascia was incised  in the midline and the peritoneal  cavity was entered cautiously. Upon entering the peritoneal cavity,  there was a large amount of free fluid. This measures 500-700 cc, which  was evacuated. There were multiple interloop collections which when  broken up show green purulent fluid which was evacuated. Omentum was  adherent to the inflammatory process as well as to the sigmoid colon.  The incision was extended slightly. Omentum was mobilized. The omentum  was transected where it was densely adherent to the cecum and terminal  ileum. It was transected using the LigaSure device for hemostasis.  Small bowel was delivered out of the pelvis or retroperitoneum. It was  followed distally where there was apparent involvement with Crohn  ileitis extending to the cecum. There were dense adhesions in the right  lower quadrant to the abdominal wall and retroperitoneum, partially due  to chronic inflammation, but also likely due to the patient's previous  surgical history of appendectomy. Sharp dissection was employed to  mobilize the lateral wall of the cecum from the abdominal wall. Right  colon was mobilized along the peritoneal reflection and hepatic flexure  was taken down with the electrocautery and used for hemostasis. This  provides good mobilization of the entire right colon.  A point was selected in the distal small bowel, probably 25-30 cm from  the ileocecal valve to transect the small bowel above the level of  active disease. This was performed with a GIA stapler. A point in the  ascending colon just above the cecum was selected and also transected  with a GIA stapler. Small bowel and colonic mesentery was divided with  the ligature and reinforced with 2-0 silk  figure-of-8 suture ligatures.  The specimen was excised in its entirety and submitted to pathology for  review.  A side-to-side anastomosis was created between the ileum and the  ascending colon. This was performed with a GIA  stapler. Good  hemostasis was noted along the staple line. No active mucosal disease  was identified on inspection. Enterotomy was closed with a TA-60  stapler. Anastomosis was tested with gentle pressure and there was no  evidence of leakage. Mesenteric defect was closed with interrupted 2-0  silk sutures.  Gauze were changed. Abdomen was copiously irrigated with warm saline  which was evacuated. This returned to the peritoneal cavity and covered  with the omentum. Good hemostasis was noted. All sponge counts were  correct.  Midline incision was closed with interrupted #1 Novafil simple sutures.  Subcutaneous tissues were irrigated. Skin was loosely approximated with  stainless steel staples and subcutaneous tissues were packed with 4 x 4  gauze sponges saturated with Betadine solution. Sterile dressings were  applied. The patient was awakened from anesthesia and brought to the  recovery room. The patient tolerated the procedure well.  Velora Heckler, MD, [facility]  Office: [phone]

## 2012-10-30 ENCOUNTER — Other Ambulatory Visit (INDEPENDENT_AMBULATORY_CARE_PROVIDER_SITE_OTHER): Payer: BC Managed Care – PPO

## 2012-10-30 DIAGNOSIS — Z79899 Other long term (current) drug therapy: Secondary | ICD-10-CM

## 2012-10-30 LAB — CBC WITH DIFFERENTIAL/PLATELET
Basophils Relative: 0.5 % (ref 0.0–3.0)
Eosinophils Relative: 3.6 % (ref 0.0–5.0)
HCT: 39.4 % (ref 39.0–52.0)
Hemoglobin: 13 g/dL (ref 13.0–17.0)
Lymphs Abs: 2.3 10*3/uL (ref 0.7–4.0)
Monocytes Relative: 4.7 % (ref 3.0–12.0)
Neutro Abs: 8.6 10*3/uL — ABNORMAL HIGH (ref 1.4–7.7)
RBC: 4.58 Mil/uL (ref 4.22–5.81)
WBC: 11.9 10*3/uL — ABNORMAL HIGH (ref 4.5–10.5)

## 2012-10-30 LAB — HEPATIC FUNCTION PANEL: Albumin: 4 g/dL (ref 3.5–5.2)

## 2012-10-31 ENCOUNTER — Other Ambulatory Visit: Payer: Self-pay

## 2012-10-31 DIAGNOSIS — K5010 Crohn's disease of large intestine without complications: Secondary | ICD-10-CM

## 2012-10-31 NOTE — Progress Notes (Signed)
Quick Note:  Lab tests ok - repeat in 1 month He is to call back mid-month with symptom update and we will decide if he needs more imaging by 12/31 ______

## 2012-11-01 ENCOUNTER — Encounter: Payer: Self-pay | Admitting: Family Medicine

## 2012-11-07 ENCOUNTER — Other Ambulatory Visit: Payer: Self-pay | Admitting: Internal Medicine

## 2012-11-07 DIAGNOSIS — R109 Unspecified abdominal pain: Secondary | ICD-10-CM

## 2012-11-07 MED ORDER — OXYCODONE-ACETAMINOPHEN 5-325 MG PO TABS: 1.0000 | ORAL_TABLET | Freq: Four times a day (QID) | ORAL | Status: AC | PRN

## 2012-11-07 MED ORDER — LORAZEPAM 2 MG PO TABS: 2.0000 mg | ORAL_TABLET | Freq: Four times a day (QID) | ORAL | Status: DC | PRN

## 2012-11-07 MED ORDER — OXYCODONE HCL 5 MG PO TABS: 5.0000 mg | ORAL_TABLET | ORAL | Status: DC | PRN

## 2012-11-07 NOTE — Telephone Encounter (Signed)
Patient also requesting Ativan refill, please advise, thx

## 2012-11-07 NOTE — Telephone Encounter (Signed)
Pt informed that Ativan and Roxicet rx's up front for pick up.  He will come by tomorrow and get them he said.

## 2012-11-07 NOTE — Telephone Encounter (Signed)
Ok to both 

## 2012-11-10 ENCOUNTER — Telehealth: Payer: Self-pay | Admitting: Internal Medicine

## 2012-11-10 NOTE — Telephone Encounter (Signed)
error 

## 2012-11-13 ENCOUNTER — Telehealth: Payer: Self-pay | Admitting: Internal Medicine

## 2012-11-13 NOTE — Telephone Encounter (Signed)
I will call him later

## 2012-11-13 NOTE — Telephone Encounter (Signed)
He is stable  Will see Dr. Steele Sizer 12/27/11 I reminded him of labs to be done 1/3 - he says "I have missed some doses of 6 MP"  No other testing now We will f/u by phone after labs and await second opinion

## 2012-11-13 NOTE — Telephone Encounter (Signed)
Patient was to call back with an update.  He has intermittent cramping and pain.  Patient is tolerating a diet and is using Miralax.  He has no real change in his symptoms since the last office visit.  He has a second opinion with Dr. Chesley Mires at [facility] for next month at your suggestion.  He is wondering if these symptoms are something he needs to live with, or should he pursue a other options.

## 2012-11-20 ENCOUNTER — Encounter: Payer: Self-pay | Admitting: Physician Assistant

## 2012-11-20 ENCOUNTER — Inpatient Hospital Stay (HOSPITAL_COMMUNITY): Payer: BC Managed Care – PPO

## 2012-11-20 ENCOUNTER — Ambulatory Visit (INDEPENDENT_AMBULATORY_CARE_PROVIDER_SITE_OTHER): Payer: BC Managed Care – PPO | Admitting: Physician Assistant

## 2012-11-20 ENCOUNTER — Telehealth: Payer: Self-pay | Admitting: Internal Medicine

## 2012-11-20 ENCOUNTER — Inpatient Hospital Stay (HOSPITAL_COMMUNITY)
Admission: AD | Admit: 2012-11-20 | Discharge: 2012-11-30 | DRG: 552 | Disposition: A | Discharge status: Home / Self Care | Payer: BC Managed Care – PPO | Source: Ambulatory Visit | Attending: Gastroenterology | Admitting: Gastroenterology

## 2012-11-20 ENCOUNTER — Encounter (HOSPITAL_COMMUNITY): Payer: Self-pay

## 2012-11-20 VITALS — BP 112/78 | HR 88 | Ht 64.0 in | Wt 136.4 lb

## 2012-11-20 DIAGNOSIS — R1084 Generalized abdominal pain: Secondary | ICD-10-CM

## 2012-11-20 DIAGNOSIS — K439 Ventral hernia without obstruction or gangrene: Secondary | ICD-10-CM

## 2012-11-20 DIAGNOSIS — J449 Chronic obstructive pulmonary disease, unspecified: Secondary | ICD-10-CM | POA: Diagnosis present

## 2012-11-20 DIAGNOSIS — Z9049 Acquired absence of other specified parts of digestive tract: Secondary | ICD-10-CM

## 2012-11-20 DIAGNOSIS — K219 Gastro-esophageal reflux disease without esophagitis: Secondary | ICD-10-CM | POA: Diagnosis present

## 2012-11-20 DIAGNOSIS — F17200 Nicotine dependence, unspecified, uncomplicated: Secondary | ICD-10-CM | POA: Diagnosis present

## 2012-11-20 DIAGNOSIS — E46 Unspecified protein-calorie malnutrition: Secondary | ICD-10-CM | POA: Diagnosis present

## 2012-11-20 DIAGNOSIS — Z860100 Personal history of colon polyps, unspecified: Secondary | ICD-10-CM

## 2012-11-20 DIAGNOSIS — K5090 Crohn's disease, unspecified, without complications: Secondary | ICD-10-CM

## 2012-11-20 DIAGNOSIS — K5080 Crohn's disease of both small and large intestine without complications: Secondary | ICD-10-CM | POA: Diagnosis present

## 2012-11-20 DIAGNOSIS — K565 Intestinal adhesions [bands], unspecified as to partial versus complete obstruction: Principal | ICD-10-CM | POA: Diagnosis present

## 2012-11-20 DIAGNOSIS — K632 Fistula of intestine: Secondary | ICD-10-CM | POA: Diagnosis present

## 2012-11-20 DIAGNOSIS — K56609 Unspecified intestinal obstruction, unspecified as to partial versus complete obstruction: Secondary | ICD-10-CM

## 2012-11-20 DIAGNOSIS — Z79899 Other long term (current) drug therapy: Secondary | ICD-10-CM

## 2012-11-20 DIAGNOSIS — J4489 Other specified chronic obstructive pulmonary disease: Secondary | ICD-10-CM | POA: Diagnosis present

## 2012-11-20 DIAGNOSIS — R933 Abnormal findings on diagnostic imaging of other parts of digestive tract: Secondary | ICD-10-CM

## 2012-11-20 DIAGNOSIS — IMO0002 Reserved for concepts with insufficient information to code with codable children: Secondary | ICD-10-CM

## 2012-11-20 HISTORY — DX: Fistula of intestine: K63.2

## 2012-11-20 HISTORY — DX: Benign neoplasm of colon, unspecified: D12.6

## 2012-11-20 LAB — COMPREHENSIVE METABOLIC PANEL
ALT: 11 U/L (ref 0–53)
Alkaline Phosphatase: 107 U/L (ref 39–117)
CO2: 24 mEq/L (ref 19–32)
Calcium: 9.9 mg/dL (ref 8.4–10.5)
Chloride: 97 mEq/L (ref 96–112)
GFR calc Af Amer: >90 mL/min (ref >90)
GFR calc non Af Amer: 78 mL/min — ABNORMAL LOW (ref >90)
Glucose, Bld: 89 mg/dL (ref 70–99)
Sodium: 133 mEq/L — ABNORMAL LOW (ref 135–145)
Total Bilirubin: 0.4 mg/dL (ref 0.3–1.2)

## 2012-11-20 LAB — CBC
Hemoglobin: 15 g/dL (ref 13.0–17.0)
MCH: 28.7 pg (ref 26.0–34.0)
MCV: 83.5 fL (ref 78.0–100.0)
RBC: 5.22 MIL/uL (ref 4.22–5.81)

## 2012-11-20 LAB — C-REACTIVE PROTEIN: CRP: 3.4 mg/dL — ABNORMAL HIGH (ref <0.60)

## 2012-11-20 MED ORDER — BUPROPION HCL ER (XL) 150 MG PO TB24
150.0000 mg | ORAL_TABLET | Freq: Every day | ORAL | Status: DC
  Administered 2012-11-20 – 2012-11-30 (×11): 150 mg via ORAL
  Filled 2012-11-20 (×11): qty 1

## 2012-11-20 MED ORDER — ACETAMINOPHEN 325 MG PO TABS: 650.0000 mg | ORAL_TABLET | Freq: Four times a day (QID) | ORAL | Status: DC | PRN

## 2012-11-20 MED ORDER — LORAZEPAM 2 MG/ML IJ SOLN
1.0000 mg | Freq: Four times a day (QID) | INTRAMUSCULAR | Status: DC | PRN
  Administered 2012-11-20 – 2012-11-30 (×10): 1 mg via INTRAVENOUS
  Filled 2012-11-20 (×10): qty 1

## 2012-11-20 MED ORDER — HYDROMORPHONE HCL PF 1 MG/ML IJ SOLN
1.0000 mg | INTRAMUSCULAR | Status: DC | PRN
  Administered 2012-11-20 – 2012-11-29 (×41): 1 mg via INTRAVENOUS
  Filled 2012-11-20 (×43): qty 1

## 2012-11-20 MED ORDER — ONDANSETRON HCL 4 MG PO TABS: 4.0000 mg | ORAL_TABLET | Freq: Four times a day (QID) | ORAL | Status: DC | PRN

## 2012-11-20 MED ORDER — KCL IN DEXTROSE-NACL 20-5-0.45 MEQ/L-%-% IV SOLN
INTRAVENOUS | Status: DC
  Administered 2012-11-20 – 2012-11-21 (×3): via INTRAVENOUS
  Administered 2012-11-22: 1000 mL via INTRAVENOUS
  Administered 2012-11-22 – 2012-11-23 (×3): via INTRAVENOUS
  Filled 2012-11-20 (×12): qty 1000

## 2012-11-20 MED ORDER — IOHEXOL 300 MG/ML  SOLN
100.0000 mL | Freq: Once | INTRAMUSCULAR | Status: AC | PRN
  Administered 2012-11-20: 100 mL via INTRAVENOUS

## 2012-11-20 MED ORDER — OXYCODONE HCL 5 MG PO TABS
5.0000 mg | ORAL_TABLET | ORAL | Status: DC | PRN
  Administered 2012-11-22 – 2012-11-30 (×2): 5 mg via ORAL
  Filled 2012-11-20 (×3): qty 1

## 2012-11-20 MED ORDER — ONDANSETRON HCL 4 MG/2ML IJ SOLN
4.0000 mg | Freq: Four times a day (QID) | INTRAMUSCULAR | Status: DC | PRN
  Filled 2012-11-20: qty 2

## 2012-11-20 MED ORDER — MERCAPTOPURINE 50 MG PO TABS
75.0000 mg | ORAL_TABLET | Freq: Every day | ORAL | Status: DC
  Administered 2012-11-21 – 2012-11-30 (×10): 75 mg via ORAL
  Filled 2012-11-20 (×11): qty 2

## 2012-11-20 MED ORDER — VITAMINS A & D EX OINT
TOPICAL_OINTMENT | CUTANEOUS | Status: AC
  Filled 2012-11-20: qty 5

## 2012-11-20 MED ORDER — LORAZEPAM 1 MG PO TABS
2.0000 mg | ORAL_TABLET | Freq: Four times a day (QID) | ORAL | Status: DC | PRN
  Filled 2012-11-20: qty 2

## 2012-11-20 MED ORDER — ACETAMINOPHEN 650 MG RE SUPP: 650.0000 mg | Freq: Four times a day (QID) | RECTAL | Status: DC | PRN

## 2012-11-20 NOTE — Telephone Encounter (Signed)
Patient calling to report abdominal pain that is at the bulge in his stomach(states he has the bulge since he had surgery) Pain is not helped by Codeine. It does go away but comes right back. States the pain is not like the pain he has with SBO. States he is having stools. Scheduled with Mike Gip, PA today ato 10:30 AM.

## 2012-11-20 NOTE — Progress Notes (Signed)
Patient ID: Samuel Montgomery, male   DOB: June 11, 1956, 56 y.o.   MRN: [id number] Patient seen in the office today as an urgent add-on. He is admitted to [facility] for further diagnostic evaluation, and management. Patient with complicated recent history secondary to perforated terminal ileum in the setting of Crohn's disease, now presenting with progressive pain over the past 4-5 days and associated nausea and vomiting. Current clinical picture concerning for bowel obstruction. Please see the dictated H&P for details

## 2012-11-20 NOTE — Progress Notes (Signed)
CT reviewed and suggests a mechanical SBO.  Plan NGT decompression.

## 2012-11-20 NOTE — H&P (Signed)
Primary Care Physician:  Lucilla Edin, MD Primary Gastroenterologist:  Dr. Stan Head  CHIEF COMPLAINT: Severe abdominal pain with nausea and vomiting  HPI: Samuel Montgomery is a 56 y.o. male who was diagnosed with Crohn's disease within the past year, and presented with perforation of the terminal ileum and intra-abdominal abscesses in June 2013. He underwent exploratory laparotomy with ileocecectomy and drainage of the intra-abdominal abscess on 616 per Dr. Gerrit Friends.. During that same admission he was also found to have evidence of cirrhosis, and COPD. He had a long hospital course and then required readmission in August of 2013 with a right peri- Rectal abscess, by MRI which was not symptomatic and then CT scanning at that same time showed a phlegmon containing air  in the right lower abdomen.. This was managed with IV antibiotics on the and the phlegmon was felt to be likely related to a small anastomotic leak area there was no drainable abscess. He improved with IV antibiotics.. Eventually he was started on Humira and 6-MP but has continued to have complaints of some lower abdominal pain. He underwent CT scan last in October of 2013 and this due to show persistent inflammatory process in the mesentery of the right lower quadrant with evidence of tethering of multiple loops of small bowel around the antral anastomosis, there was also a tiny collection of debris and gas within the mesentery between the loops of small bowel consistent with a previously opacified fistula is no evidence of contrast flow into the fistula on the exam. There was also mild diffuse distention of mall bowel proximal to the anterolateral colic anastomosis and some narrowing of small bowel loops in that area suspicious for inflammatory stricturing. Patient had requested a second opinion at [facility] and is awaiting an appointment with Dr. Chesley Mires at [facility] on 12/27/2011. He comes in today as an urgent add-on to the office  with complaints of worsening abdominal pain. He says his pain is been much worse over the past 3 or 4 days. He says he really has never felt like he has gotten better since his surgery and has had some constant abdominal discomfort however at this time he has having very sharp "debilitating" pain about every 5 minutes in the area of his ventral incisional hernia. He says this is excruciating and isn't associated intermittently with vomiting which relieves his pain briefly. He has not had any documented fever or chills or sweats he has continued to have fairly normal bowel movements without blood. He says has not been eating very well over the past for 5 days and actually ran out of his 6-MP about 2 weeks ago. He has been taking the Humira to 2 weeks and is due for another injection X. week. He is moaning intermittently during the visit today and very uncomfortable. He and his  mother also both expressed their restoration with his ongoing pain and feel that he has not gotten adequate help over the past couple of months. He states that he needs to have surgery he does not want to be operated on in [place] and would like to be transferred to [facility].   Past Medical History  Diagnosis Date  . GERD (gastroesophageal reflux disease)   . Psoriasis   . Benign neoplasm of colon 05/20/2005    adenomatous polyp  . Diverticulosis of colon (without mention of hemorrhage)   . COPD (chronic obstructive pulmonary disease)   . Inguinal hernia     bi-lateral  . 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 ? Perianal fistula vs. Abscess      Past Surgical History  Procedure Date  . Inguinal hernia repair     bilateral   . Appendectomy 1979  . Vasectomy   . Colonoscopy w/ biopsies   . Laparotomy 05/14/2012    Procedure: EXPLORATORY LAPAROTOMY;  Surgeon: Velora Heckler, MD;  Location: [facility];  Service: General;  Laterality: N/A;  . Bowel resection 05/14/2012    Procedure: SMALL BOWEL  RESECTION;  Surgeon: Velora Heckler, MD;  Location: [facility];  Service: General;  Laterality: N/A;  ileocecectomy    Prior to Admission medications   Medication Sig Start Date End Date Taking? Authorizing Provider  adalimumab (HUMIRA PEN) 40 MG/0.8ML injection Inject 40 mg sq every 2 weeks 08/22/12  Yes Iva Boop, MD  buPROPion (WELLBUTRIN XL) 150 MG 24 hr tablet Take 1 tablet (150 mg total) by mouth 2 (two) times daily. Start with 1 daily for first week 10/17/12  Yes Iva Boop, MD  LORazepam (ATIVAN) 2 MG tablet Take 1 tablet (2 mg total) by mouth every 6 (six) hours as needed for anxiety. 11/07/12  Yes Iva Boop, MD  mercaptopurine (PURINETHOL) 50 MG tablet Take 1.5 tablets (75 mg total) by mouth daily. Give on an empty stomach 1 hour before or 2 hours after meals. Caution: Chemotherapy. 09/27/12  Yes Iva Boop, MD  oxyCODONE (OXY IR/ROXICODONE) 5 MG immediate release tablet Take 1 tablet (5 mg total) by mouth every 4 (four) hours as needed for pain. 11/07/12 12/07/12 Yes Iva Boop, MD  polyethylene glycol powder (GLYCOLAX/MIRALAX) powder Take 17 g by mouth as needed. 10/25/12  Yes Iva Boop, MD  glycopyrrolate (ROBINUL) 2 MG tablet Take 1 tablet (2 mg total) by mouth 2 (two) times daily as needed. For abdominal cramps 09/15/12   Iva Boop, MD    No current outpatient prescriptions on file.    Allergies as of 11/20/2012  . (No Known Allergies)    Family History  Problem Relation Age of Onset  . Colon polyps Sister     x 2  . Crohn's disease Sister   . Stroke Father   . Colon cancer Neg Hx   . Hyperlipidemia Mother   . GER disease Mother     History   Social History  . Marital Status: Legally Separated    Spouse Name: N/A    Number of Children: 2  . Years of Education: N/A   Occupational History  . [employer]   .     Social History Main Topics  . Smoking status: Current Some Day Smoker -- 40 years    Types: Cigarettes  . Smokeless tobacco:  Former Neurosurgeon    Types: Chew     Comment: patient refused to answer  . Alcohol Use: 0.0 oz/week     Comment: 2-3 every 4-5 days   . Drug Use: Yes    Special: Marijuana     Comment: per patient uses pot monthly.  . Sexually Active: No   Other Topics Concern  . Not on file   Social History Narrative  . No narrative on file    Review of Systems: Pertinent positive and negative review of systems were noted in the above HPI section.  All other review of systems was otherwise negative. Physical Exam: Vital signs in last 24 hours: @VSRANGES @   General:   Alert,  well-developed, cooperative,middle-aged white male intermittently moaning, accompanied by his  mother. Blood pressure 112/78, pulse 88, height 5 foot 4 weight 136 Head:  Normocephalic and atraumatic. Eyes:  Sclera clear, no icterus.   Conjunctiva pink. Ears:  Normal auditory acuity. Mouth:  No deformity or lesions.  Oropharynx somewhat dry with fruity odor to  breath Neck:  Supple; no masses or thyromegaly. Lungs:  Clear throughout to auscultation.   No wheezes, crackles, or rhonchi. No acute distress. Heart:  Regular rate and rhythm; no murmurs. Abdomen:  Soft,, bowel sounds are present but hypoactive, he has a healed midline incisional scar and a large ventral hernia with distended loops of bowel wall palpable within the hernia he is exquisitely tender to palpation, he also has generalized abdominal tenderness with no guarding but positive rebound no palpable mass or hepatosplenomegaly  Rectal:  Not done  Msk:  Symmetrical without gross deformities. Normal posture. Pulses:  Normal pulses noted. Extremities:  Without edema. Neurologic:  Alert and  oriented x4;  grossly normal neurologically. Skin:  Intact without significant lesions or rashes. Cervical Nodes:  No significant cervical adenopathy. Psych:  Alert and cooperative.upset and crying Lab Results: pending   Studies/Results: No results found.  Impression / Plan:  #18  56 year old white male with Crohn's ileocolitis status post perforated terminal ileum June 2013 with associated intra-abdominal abscesses status post exploratory lap with ileocecectomy and drainage of the intra-abdominal abscess. Patient had a readmission in August 2013 with right lower quadrant  phlegmon felt  to be secondary to an anastomotic leak. This was treated medically with IV antibiotics and he did not require drainage. 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 Patient has had persistent complaints and pain ever since his surgery and now presents with excruciating pain over the past 4-5 days with intermittent nausea and vomiting. His current symptoms are most consistent with a at least partial small bowel obstruction. Is not clear whether this is related to the ventral hernia or secondary to small bowel stricturing and persistent inflammation secondary to Crohn's.  #2 previously documented cirrhosis, compensated secondary to EtOH #3 COPD    Plan; patient is admitted to the GI service, will obtain stat CT scan of the abdomen and pelvis, baseline labs, start IV fluids and clear liquids for now. Will facilitate transfer to [facility] for her surgery if indicated . I have held off on antibiotics until CT is completed.     @RRHLOS @  Amy Esterwood  11/20/2012, 11:41 AM

## 2012-11-20 NOTE — Progress Notes (Signed)
Patient seen and examined. Mother in room. Agree with assessment and plans as outlined. Needs hospitalization to further evaluate and treat severe abdominal pain.

## 2012-11-21 ENCOUNTER — Inpatient Hospital Stay (HOSPITAL_COMMUNITY): Payer: BC Managed Care – PPO

## 2012-11-21 DIAGNOSIS — K56609 Unspecified intestinal obstruction, unspecified as to partial versus complete obstruction: Secondary | ICD-10-CM

## 2012-11-21 DIAGNOSIS — K5090 Crohn's disease, unspecified, without complications: Secondary | ICD-10-CM

## 2012-11-21 LAB — CBC
HCT: 38.4 % — ABNORMAL LOW (ref 39.0–52.0)
MCH: 28.7 pg (ref 26.0–34.0)
MCHC: 34.1 g/dL (ref 30.0–36.0)
MCV: 84.2 fL (ref 78.0–100.0)
Platelets: 311 10*3/uL (ref 150–400)
RDW: 17.2 % — ABNORMAL HIGH (ref 11.5–15.5)

## 2012-11-21 LAB — BASIC METABOLIC PANEL
BUN: 15 mg/dL (ref 6–23)
CO2: 24 mEq/L (ref 19–32)
Calcium: 8.9 mg/dL (ref 8.4–10.5)
Creatinine, Ser: 0.94 mg/dL (ref 0.50–1.35)
Glucose, Bld: 96 mg/dL (ref 70–99)

## 2012-11-21 NOTE — Progress Notes (Signed)
Pt ambulating in the hall tolerating well.Pt NPO, ABd. X-rays ordered for today and 12/25. Pt. C/o of pain3-7/10 Dilaudid takes the pain down to 0.

## 2012-11-21 NOTE — Progress Notes (Signed)
[facility] Progress Note  Subjective:  Nausea improved with NGT placement.  Pain is improved with the dilaudid, but gets severe pain when it wears off.  Large amount of red liquid in NG canister because he was on a clear liquid diet.  Objective:  Vital signs in last 24 hours: Temp:  [97.4 F (36.3 C)-98.1 F (36.7 C)] 97.4 F (36.3 C) (12/24 0545) Pulse Rate:  [58-89] 59  (12/24 0545) Resp:  [17] 17  (12/24 0545) BP: (106-153)/(63-90) 106/63 mmHg (12/24 0545) SpO2:  [98 %-100 %] 98 % (12/24 0545) Weight:  [135 lb 6.4 oz (61.417 kg)] 135 lb 6.4 oz (61.417 kg) (12/23 1208) Last BM Date: 11/21/12 General:   Alert, Well-developed, in NAD Heart:  Regular rate and rhythm; no murmurs Pulm:  CTAB.  No W/R/R. Abdomen:  Softly distended.  Large ventral/incisional hernia that is tender on palpation.  BS present.  Diffuse TTP > right side of the abdomen. Extremities:  Without edema. Neurologic:  Alert and  oriented x4;  grossly normal neurologically. Psych:  Alert and cooperative. Normal mood and affect.  Intake/Output from previous day: 12/23 0701 - 12/24 0700 In: 1076 [I.V.:1076] Out: 300   Lab Results:  Basename 11/21/12 0350 11/20/12 1226  WBC 8.2 12.2*  HGB 13.1 15.0  HCT 38.4* 43.6  PLT 311 355   BMET  Basename 11/21/12 0350 11/20/12 1226  NA 133* 133*  K 3.9 3.9  CL 102 97  CO2 24 24  GLUCOSE 96 89  BUN 15 16  CREATININE 0.94 1.04  CALCIUM 8.9 9.9   LFT  Basename 11/20/12 1226  PROT 7.5  ALBUMIN 3.6  AST 13  ALT 11  ALKPHOS 107  BILITOT 0.4  BILIDIR --  IBILI --   PT/INR  Basename 11/20/12 1226  LABPROT 11.7  INR 0.86   Ct Abdomen Pelvis W Contrast  11/20/2012  *RADIOLOGY REPORT*  Clinical Data: History of Crohn disease.  Abdominal pain.  Evaluate for bowel obstruction.  CT ABDOMEN AND PELVIS WITH CONTRAST  Technique:  Multidetector CT imaging of the abdomen and pelvis was performed following the standard protocol during bolus  administration of intravenous contrast.  Contrast: OMNIPAQUE IOHEXOL 300 MG/ML  SOLN  Comparison: Old priors, most recently CT of the abdomen and pelvis 09/13/2012.  Findings:  Lung Bases: Calcified granuloma in the left lower lobe.  Dependent atelectasis in the lower lobes of the lungs bilaterally.  3 mm noncalcified nodule in the periphery of the left lower lobe (image 10 of series 5) is unchanged compared to 04/11/2012, likely representing small subpleural lymph node.  Abdomen/Pelvis:  The appearance of the liver, gallbladder, pancreas, bilateral adrenal glands and bilateral kidneys is unremarkable.  Multiple small calcifications within the spleen are compatible with small calcified granulomas.  Atherosclerosis throughout the abdominal and pelvic vasculature, without definite aneurysm or dissection.  There is marked dilatation throughout the small bowel measuring up to approximately 4.7 cm in diameter and multiple air-fluid levels. This involves the entire small bowel, and appears to terminate in the region of the right lower quadrant, where there is a large conglomerate of multiple small bowel loops and the adjacent cecum, likely reflecting a large amount of inflammatory adhesions.  In the midst of these adhesions in the right lower quadrant there is a low attenuation rim enhancing area best demonstrated on axial images 39- 42 of series 2, suspicious for an enteroenteric fistula. Postoperative changes of partial bowel resection (likely resection of the terminal ileum and  a portion of the cecum) are noted.  No large volume of ascites or pneumoperitoneum is identified at this time.  No definite pathologic lymphadenopathy is identified within the abdomen or pelvis.  Prostate and urinary bladder are unremarkable in appearance.  Trace amount of free fluid in the pelvis is presumably reactive.  Musculoskeletal: There are no aggressive appearing lytic or blastic lesions noted in the visualized portions of the  skeleton.  IMPRESSION: 1.  Marked dilatation of the small bowel, compatible with a small bowel obstruction.  This appears to be related to adhesions (there is a tangle of small bowel loops and adjacent cecum in the right lower quadrant). 2.  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 3.  Trace amount of free fluid in the pelvis is presumably reactive. 4.  Additional incidental findings, as above.   Original Report Authenticated By: Trudie Reed, M.D.     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  *Patient was on clear liquid diet.  Will change to NPO. *Check obstruction series at 8 pm tonight at 8 am tomorrow. *? Need for transfer to [facility] if surgery needed.   LOS: 1 day   ZEHR, JESSICA D.  11/21/2012, 11:25 AM  Pager number [phone]  Still with pain and partial SBO by x-ray.  I suspect this may be related to the large ventral hernia.  Plan as above.

## 2012-11-22 ENCOUNTER — Inpatient Hospital Stay (HOSPITAL_COMMUNITY): Payer: BC Managed Care – PPO

## 2012-11-22 LAB — BASIC METABOLIC PANEL
BUN: 9 mg/dL (ref 6–23)
Creatinine, Ser: 0.98 mg/dL (ref 0.50–1.35)
GFR calc Af Amer: >90 mL/min (ref >90)
GFR calc non Af Amer: 90 mL/min — ABNORMAL LOW (ref >90)
Potassium: 3.7 mEq/L (ref 3.5–5.1)

## 2012-11-22 LAB — CBC
MCHC: 33.2 g/dL (ref 30.0–36.0)
RDW: 17.2 % — ABNORMAL HIGH (ref 11.5–15.5)
WBC: 7.3 10*3/uL (ref 4.0–10.5)

## 2012-11-22 NOTE — Progress Notes (Signed)
Pt has been messing with his iv pump and NG tube wall suction by turning them off and on. He states he's been trying to reprogram the iv pump, the RN explained to pt that only nurses are trained to control the pumps. Will keep monitoring

## 2012-11-22 NOTE — Progress Notes (Signed)
Ambulating in the hallway tolerating well. Abdominal binder on.

## 2012-11-22 NOTE — Progress Notes (Signed)
[facility] Progress Note  Subjective: *Perhaps slightly less pain. Passed a small amount of flatus.**  Objective:  Vital signs in last 24 hours: Temp:  [97.8 F (36.6 C)-98.4 F (36.9 C)] 97.8 F (36.6 C) (12/25 0540) Pulse Rate:  [53-68] 68  (12/25 0540) Resp:  [18-20] 20  (12/25 0540) BP: (102-112)/(64-73) 109/64 mmHg (12/25 0540) SpO2:  [94 %-99 %] 98 % (12/25 0540) Last BM Date: 11/21/12 General:   Alert,  Well-developed,  white male in NAD Heart:  Regular rate and rhythm; no murmurs Abdomen:  Soft, . Normal bowel sounds, without guarding, and without rebound.  Large ventral hernia that is reducible Extremities:  Without edema. Neurologic:  Alert and  oriented x4;  grossly normal neurologically. Psych:  Alert and cooperative. Normal mood and affect.  Intake/Output from previous day: 12/24 0701 - 12/25 0700 In: 1140 [P.O.:240; I.V.:900] Out: 2301 [Urine:1450; Stool:1] Intake/Output this shift:   NG tube 850 cc output  Lab Results:  Basename 11/22/12 0405 11/21/12 0350 11/20/12 1226  WBC 7.3 8.2 12.2*  HGB 12.1* 13.1 15.0  HCT 36.4* 38.4* 43.6  PLT 255 311 355   BMET  Basename 11/22/12 0405 11/21/12 0350 11/20/12 1226  NA 136 133* 133*  K 3.7 3.9 3.9  CL 103 102 97  CO2 25 24 24   GLUCOSE 92 96 89  BUN 9 15 16   CREATININE 0.98 0.94 1.04  CALCIUM 8.8 8.9 9.9   LFT  Basename 11/20/12 1226  PROT 7.5  ALBUMIN 3.6  AST 13  ALT 11  ALKPHOS 107  BILITOT 0.4  BILIDIR --  IBILI --   PT/INR  Basename 11/20/12 1226  LABPROT 11.7  INR 0.86   Hepatitis Panel No results found for this basename: HEPBSAG,HCVAB,HEPAIGM,HEPBIGM in the last 72 hours  Studies/Results: Ct Abdomen Pelvis W Contrast  11/20/2012  *RADIOLOGY REPORT*  Clinical Data: History of Crohn disease.  Abdominal pain.  Evaluate for bowel obstruction.  CT ABDOMEN AND PELVIS WITH CONTRAST  Technique:  Multidetector CT imaging of the abdomen and pelvis was performed following  the standard protocol during bolus administration of intravenous contrast.  Contrast: OMNIPAQUE IOHEXOL 300 MG/ML  SOLN  Comparison: Old priors, most recently CT of the abdomen and pelvis 09/13/2012.  Findings:  Lung Bases: Calcified granuloma in the left lower lobe.  Dependent atelectasis in the lower lobes of the lungs bilaterally.  3 mm noncalcified nodule in the periphery of the left lower lobe (image 10 of series 5) is unchanged compared to 04/11/2012, likely representing small subpleural lymph node.  Abdomen/Pelvis:  The appearance of the liver, gallbladder, pancreas, bilateral adrenal glands and bilateral kidneys is unremarkable.  Multiple small calcifications within the spleen are compatible with small calcified granulomas.  Atherosclerosis throughout the abdominal and pelvic vasculature, without definite aneurysm or dissection.  There is marked dilatation throughout the small bowel measuring up to approximately 4.7 cm in diameter and multiple air-fluid levels. This involves the entire small bowel, and appears to terminate in the region of the right lower quadrant, where there is a large conglomerate of multiple small bowel loops and the adjacent cecum, likely reflecting a large amount of inflammatory adhesions.  In the midst of these adhesions in the right lower quadrant there is a low attenuation rim enhancing area best demonstrated on axial images 39- 42 of series 2, suspicious for an enteroenteric fistula. Postoperative changes of partial bowel resection (likely resection of the terminal ileum and a portion of the cecum) are noted.  No large volume of ascites or pneumoperitoneum is identified at this time.  No definite pathologic lymphadenopathy is identified within the abdomen or pelvis.  Prostate and urinary bladder are unremarkable in appearance.  Trace amount of free fluid in the pelvis is presumably reactive.  Musculoskeletal: There are no aggressive appearing lytic or blastic lesions noted in  the visualized portions of the skeleton.  IMPRESSION: 1.  Marked dilatation of the small bowel, compatible with a small bowel obstruction.  This appears to be related to adhesions (there is a tangle of small bowel loops and adjacent cecum in the right lower quadrant). 2.  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 3.  Trace amount of free fluid in the pelvis is presumably reactive. 4.  Additional incidental findings, as above.   Original Report Authenticated By: Trudie Reed, M.D.    Dg Abd 2 Views  11/22/2012  *RADIOLOGY REPORT*  Clinical Data: Small bowel resection and July 2013, now with lower abdominal pain and small bowel obstruction, follow-up  ABDOMEN - 2 VIEW  Comparison: Abdomen films of 11/21/2012  Findings: There is little change in gaseous distention of small bowel loops measuring up to 5.3 cm in diameter consistent with persistent partial small bowel obstruction.  The colon is decompressed and does contain some contrast with air noted to the rectum.  An NG tube coils in the fundus of the stomach.  No free air is seen.  IMPRESSION: Persistent small bowel obstruction.  NG tube coils in the fundus of the stomach.   Original Report Authenticated By: Dwyane Dee, M.D.    Dg Abd 2 Views  11/21/2012  *RADIOLOGY REPORT*  Clinical Data: small bowel obstruction  ABDOMEN - 2 VIEW  Comparison: CT abdomen and pelvis November 20, 2012  Findings: Supine and upright abdomen views were obtained. Nasogastric tube tip and side port in the stomach.  There remain multiple loops of dilated bowel with air-fluid levels consistent with a degree of obstruction.  Contrast does reach the colon and rectum, indicating that the small bowel obstruction is partial. No free air is seen.  IMPRESSION:  Partial small bowel obstruction with contrast reaching the colon and rectum.  Nasogastric tube tip  and side port are in the stomach.  No free air.   Original Report Authenticated By: Bretta Bang, M.D.      Assessment / Plan: *Small bowel obstruction-not significantly changed by symptoms, exam or x-ray.**Suspect this is a mechanical obstruction and not due to active Crohn's  Plan to continue conservative management. If obstruction is not improved in the next 24 hours we'll ask surgery to see.  Active Problems:  Small bowel obstruction     LOS: 2 days   Melvia Heaps  11/22/2012, 1:45 PM

## 2012-11-23 ENCOUNTER — Inpatient Hospital Stay (HOSPITAL_COMMUNITY): Payer: BC Managed Care – PPO

## 2012-11-23 ENCOUNTER — Encounter (HOSPITAL_COMMUNITY): Payer: Self-pay | Admitting: Physician Assistant

## 2012-11-23 DIAGNOSIS — K56609 Unspecified intestinal obstruction, unspecified as to partial versus complete obstruction: Secondary | ICD-10-CM

## 2012-11-23 LAB — CBC
Platelets: 238 10*3/uL (ref 150–400)
RBC: 4.44 MIL/uL (ref 4.22–5.81)
WBC: 7.6 10*3/uL (ref 4.0–10.5)

## 2012-11-23 MED ORDER — MENTHOL 3 MG MT LOZG
1.0000 | LOZENGE | OROMUCOSAL | Status: DC | PRN
  Filled 2012-11-23 (×2): qty 9

## 2012-11-23 NOTE — Consult Note (Signed)
Samuel Montgomery [birth year]  [id number].   Primary GI MD: Dr. Stan Head Requesting MD: Dr. Arlyce Dice Chief Complaint/Reason for Consult: SBO HPI: This is a 56 year old male who is well known to our service for a prior history of Crohn's disease that resulted in a ileocecectomy earlier this year. He had some complications from this with right lower quadrant inflammation and small phlegmon. This resolved. Apparently, in the interim he has been having issues tolerating his diet on occasions. He has been seeing Dr. Leone Payor, and has occasionally been diagnosed with a partial small bowel obstruction via plain abdominal x-rays. He has been treated conservatively as an outpatient. This usually resolves; however, he has continued to have issues with tolerating his meals. He has lost weight. He has intermittent nausea and vomiting. He does continue to complain of abdominal pain over his incisional scar as well as in his right lower quadrant. In the interim he has also developed an incisional hernia.  Patient was admitted 4 days ago with recurrent symptoms of nausea and vomiting and inability to pass flatus or stools. He had a CT scan which revealed dilated small bowel and findings consistent with a small bowel obstruction. An NG tube was placed and he was put on conservative bowel rest management. Followup x-rays have revealed a persistent high-grade partial small bowel obstruction as he does have evidence of contrast in his colon. He is also passed a little bit of flatus and had several small bowel movements. Because of persistent findings on x-ray of small bowel obstruction we have been asked to evaluate the patient for further management.  Review of systems: Please see history of present illness otherwise all their systems have been reviewed and are negative.  Family History  Problem Relation Age of Onset  . Colon polyps Sister     x 2  . Crohn's disease Sister   . Stroke Father   . Colon cancer Neg Hx   .  Hyperlipidemia Mother   . GER disease Mother     Past Medical History  Diagnosis Date  . GERD (gastroesophageal reflux disease)   . Psoriasis   . Benign neoplasm of colon 05/20/2005    adenomatous polyp  . Diverticulosis of colon (without mention of hemorrhage)   . COPD (chronic obstructive pulmonary disease)   . Inguinal hernia     bi-lateral  . 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 ? Perianal fistula vs. Abscess    . Adenomatous colon polyp 04/2005    colonoscopy by Dr Leone Payor.     Past Surgical History  Procedure Date  . Inguinal hernia repair     bilateral   . Appendectomy 1979  . Vasectomy   . Colonoscopy w/ biopsies   . Laparotomy 05/14/2012    Procedure: EXPLORATORY LAPAROTOMY;  Surgeon: Velora Heckler, MD;  Location: [facility];  Service: General;  Laterality: N/A;  . Bowel resection 05/14/2012    Procedure: SMALL BOWEL RESECTION;  Surgeon: Velora Heckler, MD;  Location: [facility];  Service: General;  Laterality: N/A;  ileocecectomy    Social History:  reports that he has been smoking Cigarettes.  He has smoked for the past 40 years. He has quit using smokeless tobacco. His smokeless tobacco use included Chew. He reports that he drinks alcohol. He reports that he uses illicit drugs (Marijuana).  Allergies: No Known Allergies  Medications Prior to Admission  Medication Sig Dispense Refill  . adalimumab (HUMIRA PEN) 40 MG/0.8ML injection Inject 40 mg  sq every 2 weeks  1 each  6  . buPROPion (WELLBUTRIN XL) 150 MG 24 hr tablet Take 1 tablet (150 mg total) by mouth 2 (two) times daily. Start with 1 daily for first week  60 tablet  5  . glycopyrrolate (ROBINUL) 2 MG tablet Take 1 tablet (2 mg total) by mouth 2 (two) times daily as needed. For abdominal cramps  60 tablet  3  . LORazepam (ATIVAN) 2 MG tablet Take 1 tablet (2 mg total) by mouth every 6 (six) hours as needed for anxiety.  60 tablet  1  . mercaptopurine (PURINETHOL) 50 MG tablet Take 1.5 tablets  (75 mg total) by mouth daily. Give on an empty stomach 1 hour before or 2 hours after meals. Caution: Chemotherapy.  45 tablet  1  . oxyCODONE-acetaminophen (PERCOCET/ROXICET) 5-325 MG per tablet Take 1 tablet by mouth every 4 (four) hours as needed. For pain      . polyethylene glycol powder (GLYCOLAX/MIRALAX) powder Take 17 g by mouth as needed.      . Probiotic Product (PROBIOTIC DAILY PO) Take 1 capsule by mouth daily.        Blood pressure 99/65, pulse 56, temperature 97.9 F (36.6 C), temperature source Oral, resp. rate 18, height 5\' 4"  (1.626 m), weight 135 lb 6.4 oz (61.417 kg), SpO2 98.00%. Physical Exam: General: pleasant, WD, WN white male who is laying in bed in NAD HEENT: head is normocephalic, atraumatic.  Sclera are noninjected.  PERRL.  Ears and nose without any masses or lesions.  Mouth is pink and moist Heart: regular, rate, and rhythm.  Normal s1,s2. No obvious murmurs, gallops, or rubs noted.  Palpable radial and pedal pulses bilaterally Lungs: CTAB, no wheezes, rhonchi, or rales noted.  Respiratory effort nonlabored Abd: soft, mildly tender near his incision as well as in the right lower quadrant. No guarding or peritoneal signs. ND, +BS, no masses organomegaly. He does have an incisional hernia noted with his defects palpable. This is very soft and easily reducible. MS: all 4 extremities are symmetrical with no cyanosis, clubbing, or edema. Skin: warm and dry with no masses, lesions, or rashes Psych: A&Ox3 with an appropriate affect.    Results for orders placed during the hospital encounter of 11/20/12 (from the past 48 hour(s))  BASIC METABOLIC PANEL     Status: Abnormal   Collection Time   11/22/12  4:05 AM      Component Value Range Comment   Sodium 136  135 - 145 mEq/L    Potassium 3.7  3.5 - 5.1 mEq/L    Chloride 103  96 - 112 mEq/L    CO2 25  19 - 32 mEq/L    Glucose, Bld 92  70 - 99 mg/dL    BUN 9  6 - 23 mg/dL    Creatinine, Ser 7.82  0.50 - 1.35 mg/dL      Calcium 8.8  8.4 - 10.5 mg/dL    GFR calc non Af Amer 90 (*) >90 mL/min    GFR calc Af Amer >90  >90 mL/min   CBC     Status: Abnormal   Collection Time   11/22/12  4:05 AM      Component Value Range Comment   WBC 7.3  4.0 - 10.5 K/uL    RBC 4.23  4.22 - 5.81 MIL/uL    Hemoglobin 12.1 (*) 13.0 - 17.0 g/dL    HCT 95.6 (*) 21.3 - 52.0 %    MCV  86.1  78.0 - 100.0 fL    MCH 28.6  26.0 - 34.0 pg    MCHC 33.2  30.0 - 36.0 g/dL    RDW 57.8 (*) 46.9 - 15.5 %    Platelets 255  150 - 400 K/uL   CBC     Status: Abnormal   Collection Time   11/23/12  4:05 AM      Component Value Range Comment   WBC 7.6  4.0 - 10.5 K/uL    RBC 4.44  4.22 - 5.81 MIL/uL    Hemoglobin 12.8 (*) 13.0 - 17.0 g/dL    HCT 62.9 (*) 52.8 - 52.0 %    MCV 85.4  78.0 - 100.0 fL    MCH 28.8  26.0 - 34.0 pg    MCHC 33.8  30.0 - 36.0 g/dL    RDW 41.3 (*) 24.4 - 15.5 %    Platelets 238  150 - 400 K/uL    Dg Abd 2 Views  11/23/2012  *RADIOLOGY REPORT*  Clinical Data: Evaluate small bowel obstruction, abdominal pain  ABDOMEN - 2 VIEW  Comparison: 11/22/2012; 09/20/2012; CT abdomen pelvis - 11/20/2012  Findings:  Grossly unchanged moderate dilatation of a rather featureless loop of small bowel within the left mid hemiabdomen with index of measuring approximately 5.1 cm in diameter.  Enteric contrast is again seen within the transverse, descending, sigmoid colon and rectum.  No definite pneumoperitoneum, pneumatosis or portal venous gas.  An enteric tube tip and side port project over the left upper abdominal quadrant.  Radiopaque pills are unchanged in positioning overlying the right lower abdomen.  An enteric central line overlies the right lower abdomen.  Limited visualization of the lower thorax demonstrates sequela of prior granulomatous disease.  No focal airspace opacity or pleural effusion.  Unchanged bones.  IMPRESSION: Grossly unchanged findings compatible with at least a high grade partial small bowel obstruction.    Original Report Authenticated By: Tacey Ruiz, MD    Dg Abd 2 Views  11/22/2012  *RADIOLOGY REPORT*  Clinical Data: Small bowel resection and July 2013, now with lower abdominal pain and small bowel obstruction, follow-up  ABDOMEN - 2 VIEW  Comparison: Abdomen films of 11/21/2012  Findings: There is little change in gaseous distention of small bowel loops measuring up to 5.3 cm in diameter consistent with persistent partial small bowel obstruction.  The colon is decompressed and does contain some contrast with air noted to the rectum.  An NG tube coils in the fundus of the stomach.  No free air is seen.  IMPRESSION: Persistent small bowel obstruction.  NG tube coils in the fundus of the stomach.   Original Report Authenticated By: Dwyane Dee, M.D.        Assessment/Plan 1. High-grade partial small bowel obstruction  EXPLORATORY LAPAROTOMY, ileocectomy, drainage of abscess - T. Gerkin - 05/14/2012  History of abscess at ileo-colonic anastomosis - treated in August 2013.  History of ileo-ileal fistula. 2. Crohn's disease 3. COPD  4. GERD 5.  Smokes. 6. Bilateral inguinal hernias. R>L.   Plan: The patient is currently been on conservative management for the last 4 days. He does not appear to have made much progress on his abdominal x-rays; however, he is now passing some flatus and has had a couple small bowel movements. Given the difficulty be to operate on this patient given his recent surgical history like to continue to treat the patient nonoperatively over the next couple days. If the patient does not  improve with nonoperative management he may very likely the abdominal organs. I do not believe that the patient's bowel obstruction is related to his hernia is soft he. The CT scan also reveals more of a transition point in the right lower quadrant and what they note as a "wad" of bowel. If the patient does not improve and began to open up over the next day or 2 he will likely require a PICC line  and TNA for nutrition as well. We will follow along with you. I have had a very extensive discussion with the patient, his significant other, and his mother. They all understand the situation and the current treatment plan.  OSBORNE,KELLY E 11/23/2012, 4:02 PM Pager: [phone]  Difficult patient and history. The patient has an appt at [facility] in January for evaluation, but the patient cannot remember the name of the doctor he is to see. At this time, he is in no pain.  He had a small BM.  There is contrast in his colon on the KUB, but he has dilated small bowel.  Ovidio Kin, MD, [facility] Surgery Pager: [phone] Office phone:  [phone]

## 2012-11-23 NOTE — Progress Notes (Signed)
[facility] Progress Note  Subjective:  Pain is ok after receiving pain medication.  Up walking around.  No change or improvement on obstruction series.  Objective:  Vital signs in last 24 hours: Temp:  [97.8 F (36.6 C)-98.6 F (37 C)] 98.6 F (37 C) (12/26 0647) Pulse Rate:  [56-69] 69  (12/26 0647) Resp:  [20] 20  (12/26 0647) BP: (102-132)/(53-66) 102/53 mmHg (12/26 0647) SpO2:  [97 %-100 %] 97 % (12/26 0647) Last BM Date: 11/22/12 General:   Alert, Well-developed, in NAD Heart:  Regular rate and rhythm; no murmurs Pulm:  CTAB.  No W/R/R. Abdomen:  Soft, non-distended.  BS present but quiet.  Mild diffuse TTP without R/R/G (just received pain medication). Extremities:  Without edema. Neurologic:  Alert and  oriented x4;  grossly normal neurologically. Psych:  Alert and cooperative. Normal mood and affect.  Intake/Output from previous day: 12/25 0701 - 12/26 0700 In: 1000 [I.V.:1000] Out: 2151 [Urine:1000; Emesis/NG output:500; Stool:1]  Lab Results:  [facility] 11/23/12 0405 11/22/12 0405 11/21/12 0350  WBC 7.6 7.3 8.2  HGB 12.8* 12.1* 13.1  HCT 37.9* 36.4* 38.4*  PLT 238 255 311   BMET  Basename 11/22/12 0405 11/21/12 0350 11/20/12 1226  NA 136 133* 133*  K 3.7 3.9 3.9  CL 103 102 97  CO2 25 24 24   GLUCOSE 92 96 89  BUN 9 15 16   CREATININE 0.98 0.94 1.04  CALCIUM 8.8 8.9 9.9   LFT  Basename 11/20/12 1226  PROT 7.5  ALBUMIN 3.6  AST 13  ALT 11  ALKPHOS 107  BILITOT 0.4  BILIDIR --  IBILI --   PT/INR  Basename 11/20/12 1226  LABPROT 11.7  INR 0.86   Dg Abd 2 Views  11/22/2012  *RADIOLOGY REPORT*  Clinical Data: Small bowel resection and July 2013, now with lower abdominal pain and small bowel obstruction, follow-up  ABDOMEN - 2 VIEW  Comparison: Abdomen films of 11/21/2012  Findings: There is little change in gaseous distention of small bowel loops measuring up to 5.3 cm in diameter consistent with persistent partial small bowel  obstruction.  The colon is decompressed and does contain some contrast with air noted to the rectum.  An NG tube coils in the fundus of the stomach.  No free air is seen.  IMPRESSION: Persistent small bowel obstruction.  NG tube coils in the fundus of the stomach.   Original Report Authenticated By: Dwyane Dee, M.D.    Dg Abd 2 Views  11/21/2012  *RADIOLOGY REPORT*  Clinical Data: small bowel obstruction  ABDOMEN - 2 VIEW  Comparison: CT abdomen and pelvis November 20, 2012  Findings: Supine and upright abdomen views were obtained. Nasogastric tube tip and side port in the stomach.  There remain multiple loops of dilated bowel with air-fluid levels consistent with a degree of obstruction.  Contrast does reach the colon and rectum, indicating that the small bowel obstruction is partial. No free air is seen.  IMPRESSION:  Partial small bowel obstruction with contrast reaching the colon and rectum.  Nasogastric tube tip and side port are in the stomach.  No free air.   Original Report Authenticated By: Bretta Bang, M.D.     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   *Continue NPO and NGT. *Does not have any improvement by xray.  Will have surgery evaluate him here and then will determine possible transfer to  [facility] if needed for surgery.    LOS: 3 days   ZEHR, JESSICA D.  11/23/2012, 8:54 AM  Pager number [phone]  There is no appreciable change in his symptoms or x-rays.  He needs surgical evaluation.

## 2012-11-24 ENCOUNTER — Inpatient Hospital Stay (HOSPITAL_COMMUNITY): Payer: BC Managed Care – PPO

## 2012-11-24 LAB — COMPREHENSIVE METABOLIC PANEL
ALT: 9 U/L (ref 0–53)
AST: 11 U/L (ref 0–37)
Alkaline Phosphatase: 95 U/L (ref 39–117)
CO2: 25 mEq/L (ref 19–32)
Chloride: 102 mEq/L (ref 96–112)
GFR calc non Af Amer: 73 mL/min — ABNORMAL LOW (ref >90)
Potassium: 3.9 mEq/L (ref 3.5–5.1)
Sodium: 136 mEq/L (ref 135–145)
Total Bilirubin: 0.4 mg/dL (ref 0.3–1.2)

## 2012-11-24 MED ORDER — TRACE MINERALS CR-CU-F-FE-I-MN-MO-SE-ZN IV SOLN
INTRAVENOUS | Status: AC
  Administered 2012-11-24: 18:00:00 via INTRAVENOUS
  Filled 2012-11-24: qty 1000

## 2012-11-24 MED ORDER — SODIUM CHLORIDE 0.9 % IJ SOLN
10.0000 mL | INTRAMUSCULAR | Status: DC | PRN
  Administered 2012-11-28: 10 mL

## 2012-11-24 MED ORDER — KCL IN DEXTROSE-NACL 20-5-0.45 MEQ/L-%-% IV SOLN
INTRAVENOUS | Status: DC
  Administered 2012-11-24 – 2012-11-30 (×9): via INTRAVENOUS
  Filled 2012-11-24 (×10): qty 1000

## 2012-11-24 MED ORDER — SODIUM CHLORIDE 0.9 % IJ SOLN
10.0000 mL | Freq: Two times a day (BID) | INTRAMUSCULAR | Status: DC
  Administered 2012-11-27 – 2012-11-30 (×2): 10 mL

## 2012-11-24 MED ORDER — FAT EMULSION 20 % IV EMUL
240.0000 mL | INTRAVENOUS | Status: AC
  Administered 2012-11-24: 240 mL via INTRAVENOUS
  Filled 2012-11-24: qty 250

## 2012-11-24 NOTE — Progress Notes (Signed)
 Gastroenterology Progress Note  Subjective:  Passing some flatus.  Small BM.  Walking around frequently.  Objective:  Vital signs in last 24 hours: Temp:  [97.7 F (36.5 C)-97.9 F (36.6 C)] 97.7 F (36.5 C) (12/27 0626) Pulse Rate:  [51-56] 54  (12/27 0626) Resp:  [16-18] 18  (12/27 0626) BP: (99-102)/(60-65) 102/62 mmHg (12/27 0626) SpO2:  [98 %-99 %] 99 % (12/27 0626) Last BM Date: 11/22/12 General:   Alert, Well-developed, in NAD Heart:  Bradycardic with regular rhythm; no murmurs Pulm:  CTAB.  No W/R/R. Abdomen:  Soft, non-distended. BS present.  Mild mid-abdominal TTP without R/R/G.  Large ventral/incisional hernia noted; is reducible.  Mid-line laparotomy scar noted. Extremities:  Without edema. Neurologic:  Alert and  oriented x4;  grossly normal neurologically. Psych:  Alert and cooperative. Normal mood and affect.  Intake/Output from previous day: 12/26 0701 - 12/27 0700 In: 2314.3 [I.V.:2314.3] Out: 400 [Emesis/NG output:400]  Lab Results:  Basename 11/23/12 0405 11/22/12 0405  WBC 7.6 7.3  HGB 12.8* 12.1*  HCT 37.9* 36.4*  PLT 238 255   BMET  Basename 11/22/12 0405  NA 136  K 3.7  CL 103  CO2 25  GLUCOSE 92  BUN 9  CREATININE 0.98  CALCIUM 8.8   Dg Abd 2 Views  11/23/2012  *RADIOLOGY REPORT*  Clinical Data: Evaluate small bowel obstruction, abdominal pain  ABDOMEN - 2 VIEW  Comparison: 11/22/2012; 09/20/2012; CT abdomen pelvis - 11/20/2012  Findings:  Grossly unchanged moderate dilatation of a rather featureless loop of small bowel within the left mid hemiabdomen with index of measuring approximately 5.1 cm in diameter.  Enteric contrast is again seen within the transverse, descending, sigmoid colon and rectum.  No definite pneumoperitoneum, pneumatosis or portal venous gas.  An enteric tube tip and side port project over the left upper abdominal quadrant.  Radiopaque pills are unchanged in positioning overlying the right lower abdomen.  An  enteric central line overlies the right lower abdomen.  Limited visualization of the lower thorax demonstrates sequela of prior granulomatous disease.  No focal airspace opacity or pleural effusion.  Unchanged bones.  IMPRESSION: Grossly unchanged findings compatible with at least a high grade partial small bowel obstruction.   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   *Continue NPO and NGT.  *Recheck abdominal xrays today.  If no anticipation for clamping/removing NGT and advancing diet in the next day or two then will need PICC placement for TPN.    LOS: 4 days   ZEHR, JESSICA D.  11/24/2012, 9:14 AM  Pager number [phone]  Clinically he looks very slightly improved.  Nutrition is an issue.  Will place PICC and begin TPN if it looks like he will not be taking any oral nutrition for several days. Await repeat xrays.

## 2012-11-24 NOTE — Progress Notes (Signed)
PARENTERAL NUTRITION CONSULT NOTE - INITIAL  Pharmacy Consult for TNA Indication: SBO  No Known Allergies  Patient Measurements: Height: 5\' 4"  (162.6 cm) Weight: 135 lb 6.4 oz (61.417 kg) IBW/kg (Calculated) : 59.2   Vital Signs: Temp: 97.7 F (36.5 C) (12/27 0626) Temp src: Oral (12/27 0626) BP: 102/62 mmHg (12/27 0626) Pulse Rate: 54  (12/27 0626) Intake/Output from previous day: 12/26 0701 - 12/27 0700 In: 2314.3 [I.V.:2314.3] Out: 400 [Emesis/NG output:400] Intake/Output from this shift: Total I/O In: -  Out: 600 [Urine:600]  Labs:  [facility] 11/23/12 0405 11/22/12 0405  WBC 7.6 7.3  HGB 12.8* 12.1*  HCT 37.9* 36.4*  PLT 238 255  APTT -- --  INR -- --     Basename 11/22/12 0405  NA 136  K 3.7  CL 103  CO2 25  GLUCOSE 92  BUN 9  CREATININE 0.98  LABCREA --  CREAT24HRUR --  CALCIUM 8.8  MG --  PHOS --  PROT --  ALBUMIN --  AST --  ALT --  ALKPHOS --  BILITOT --  BILIDIR --  IBILI --  PREALBUMIN --  TRIG --  CHOLHDL --  CHOL --   Estimated Creatinine Clearance: 69.6 ml/min (by C-G formula based on Cr of 0.98).   No results found for this basename: GLUCAP:3 in the last 72 hours  Medical History: Past Medical History  Diagnosis Date  . GERD (gastroesophageal reflux disease)   . Psoriasis   . Benign neoplasm of colon 05/20/2005    adenomatous polyp  . Diverticulosis of colon (without mention of hemorrhage)   . COPD (chronic obstructive pulmonary disease)   . Inguinal hernia     bi-lateral  . 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 ? Perianal fistula vs. Abscess    . Adenomatous colon polyp 04/2005    colonoscopy by Dr Leone Payor.     Medications:  Scheduled:    . buPROPion  150 mg Oral Daily  . mercaptopurine  75 mg Oral Daily   Infusions:    . dextrose 5 % and 0.45 % NaCl with KCl 20 mEq/L 100 mL/hr at 11/23/12 1646   PRN: acetaminophen, acetaminophen, HYDROmorphone (DILAUDID) injection, LORazepam,  menthol-cetylpyridinium, ondansetron (ZOFRAN) IV, ondansetron, oxyCODONE  Current Nutrition:  NPO MIVF = dextrose 5 % and 0.45 % NaCl with KCl 20 mEq/L infusion @ 100 ml/hr  Insulin Requirements in the past 24 hours:  Not currently on insulin  Nutritional Goals:  RD recs: pending  Assessment:  Electrolytes wnl 12/25  LFTs wnl 12/23  TGs will be drawn tomorrow  Prealbumin will be drawn tomorrow  Plan:   PICC line to be placed today  Will start TNA (Clinimix E 5/20%) @ 71ml/hr, increase to goal as tolerated  Lipids/MVI/TE MWF only due to shortage  Decrease MIVF to 60 ml/hr  F/U Labs in AM  F/U RD Consult  Routine TNA labs Mon and Faye Ramsay PharmD  [phone] 11/24/2012 1:39 PM

## 2012-11-24 NOTE — Progress Notes (Signed)
INITIAL NUTRITION ASSESSMENT  DOCUMENTATION CODES Per approved criteria  -Not Applicable   INTERVENTION: TNA, per pharmacy (pt to start Clinimix E 5/20 @ 40 ml/hr with lipids and MVI on MWF)- follow for advancement  NUTRITION DIAGNOSIS: Inadequate oral food intake related to altered GI function as evidenced by SBO, NPO x 5 days.   Goal: 1) Pt will maintain current wt of 135# 2) Pt will meet >75% of estimated energy needs 3) Pt will be advanced to PO diet as medically appropriate  Monitor:  TNA tolerance, advancement, and rate; wt changes, labs, refeeding risk, skin integrity, changes in status  Reason for Assessment: Consult for new TNA  56 y.o. male  Admitting Dx: <principal problem not specified>  ASSESSMENT: Pt NPO x 5 days due to SBO. Pt with NG tube.  Chart reviewed. Pt passing flatus and had a small BM today. Pt also has hx of crohn's with multiple sx and bowel resections.  Noted hx of wt loss in wt hx, however, not significant.  Pt to start TNA today at 1800. Per Pharmacy, pt to start Clinimix E 5/20 @ 40 ml/hr with lipids and MVI on MWF, due to shortage. Current TNA regimen provides 1050.5 kcals (48 grams (18%) AA, 192 g (62%) dextrose, and 20.57 g (20%) fat). Regimen currently meets ~78% of estimated needs.  Pt does not meet criteria for malnutrition at this time, but is at risk for malnutrition given altered GI function, TNA, and hx of wt loss.   Height: Ht Readings from Last 1 Encounters:  11/20/12 5\' 4"  (1.626 m)    Weight: Wt Readings from Last 1 Encounters:  11/20/12 135 lb 6.4 oz (61.417 kg)    Ideal Body Weight: 130#  % Ideal Body Weight: 96%  Wt Readings from Last 10 Encounters:  11/20/12 135 lb 6.4 oz (61.417 kg)  11/20/12 136 lb 6 oz (61.859 kg)  10/28/12 131 lb 9.6 oz (59.693 kg)  10/17/12 138 lb 3.2 oz (62.687 kg)  09/20/12 142 lb 4 oz (64.524 kg)  09/15/12 144 lb (65.318 kg)  08/17/12 142 lb (64.411 kg)  08/14/12 144 lb 6.4 oz (65.499 kg)    08/11/12 144 lb 6 oz (65.488 kg)  08/09/12 143 lb (64.864 kg)    Usual Body Weight: 144#  % Usual Body Weight: 94%  BMI:  Body mass index is 23.24 kg/(m^2). Classified as normal weight.   Estimated Nutritional Needs: Kcal: 1300-1400 kcals daily Protein: 49-61 grams protein daily Fluid: 1.3-1.4 L fluid daily  Skin: WDL  Diet Order: NPO  EDUCATION NEEDS: -Education not appropriate at this time   Intake/Output Summary (Last 24 hours) at 11/24/12 1554 Last data filed at 11/24/12 1105  Gross per 24 hour  Intake 2314.33 ml  Output   1000 ml  Net 1314.33 ml    Last BM: 11/24/12   Labs:   Lab 11/24/12 1404 11/22/12 0405 11/21/12 0350  NA 136 136 133*  K 3.9 3.7 3.9  CL 102 103 102  CO2 25 25 24   BUN 6 9 15   CREATININE 1.10 0.98 0.94  CALCIUM 9.2 8.8 8.9  MG 2.0 -- --  PHOS 3.8 -- --  GLUCOSE 85 92 96    CBG (last 3)  No results found for this basename: GLUCAP:3 in the last 72 hours  Scheduled Meds:   . buPROPion  150 mg Oral Daily  . mercaptopurine  75 mg Oral Daily    Continuous Infusions:   . dextrose 5 % and 0.45 %  NaCl with KCl 20 mEq/L    . TPN (CLINIMIX) +/- additives     And  . fat emulsion      Past Medical History  Diagnosis Date  . GERD (gastroesophageal reflux disease)   . Psoriasis   . Benign neoplasm of colon 05/20/2005    adenomatous polyp  . Diverticulosis of colon (without mention of hemorrhage)   . COPD (chronic obstructive pulmonary disease)   . Inguinal hernia     bi-lateral  . 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 ? Perianal fistula vs. Abscess    . Adenomatous colon polyp 04/2005    colonoscopy by Dr Leone Payor.     Past Surgical History  Procedure Date  . Inguinal hernia repair     bilateral   . Appendectomy 1979  . Vasectomy   . Colonoscopy w/ biopsies   . Laparotomy 05/14/2012    Procedure: EXPLORATORY LAPAROTOMY;  Surgeon: Velora Heckler, MD;  Location: [facility];  Service: General;   Laterality: N/A;  . Bowel resection 05/14/2012    Procedure: SMALL BOWEL RESECTION;  Surgeon: Velora Heckler, MD;  Location: [facility];  Service: General;  Laterality: N/A;  ileocecectomy    Melody Haver, RD, LDN Pager: [phone] After hours Pager: [phone]

## 2012-11-24 NOTE — Progress Notes (Signed)
Peripherally Inserted Central Catheter/Midline Placement  The IV Nurse has discussed with the patient and/or persons authorized to consent for the patient, the purpose of this procedure and the potential benefits and risks involved with this procedure.  The benefits include less needle sticks, lab draws from the catheter and patient may be discharged home with the catheter.  Risks include, but not limited to, infection, bleeding, blood clot (thrombus formation), and puncture of an artery; nerve damage and irregular heat beat.  Alternatives to this procedure were also discussed.  PICC/Midline Placement Documentation  PICC / Midline Double Lumen 11/24/12 PICC Right Basilic (Active)       Samuel Montgomery, Samuel Montgomery 11/24/2012, 5:48 PM

## 2012-11-24 NOTE — Progress Notes (Signed)
NG tube clamped at approx. 4:45pm for clamp trials. Patient told to notify RN if nausea, vomiting or intolerance develops. Will pass along to night RN to reconnect NG tube to suction for 1 hour after being disconnected for 4 hours. Angelena Form, RN

## 2012-11-24 NOTE — Progress Notes (Signed)
Patient ID: Samuel Montgomery, male   DOB: [birth year], 56 y.o.   MRN: [id number]    Subjective: Pt feels well this morning.  Minimal pain.  Had some flatus and scant BM this morning.  Objective: Vital signs in last 24 hours: Temp:  [97.7 F (36.5 C)-97.9 F (36.6 C)] 97.7 F (36.5 C) (12/27 0626) Pulse Rate:  [51-56] 54  (12/27 0626) Resp:  [16-18] 18  (12/27 0626) BP: (99-102)/(60-65) 102/62 mmHg (12/27 0626) SpO2:  [98 %-99 %] 99 % (12/27 0626) Last BM Date: 11/22/12  Intake/Output from previous day: 2023-12-07 0701 - 12/27 0700 In: 2314.3 [I.V.:2314.3] Out: 400 [Emesis/NG output:400] Intake/Output this shift:    PE: Abd: soft, minimally tender, hernia soft and reducible, NGT with watered down Cepacol lozenge output, few BS  Lab Results:   Basename Dec 06, 2012 0405 11/22/12 0405  WBC 7.6 7.3  HGB 12.8* 12.1*  HCT 37.9* 36.4*  PLT 238 255   BMET  Basename 11/22/12 0405  NA 136  K 3.7  CL 103  CO2 25  GLUCOSE 92  BUN 9  CREATININE 0.98  CALCIUM 8.8   PT/INR No results found for this basename: LABPROT:2,INR:2 in the last 72 hours CMP     Component Value Date/Time   NA 136 11/22/2012 0405   K 3.7 11/22/2012 0405   CL 103 11/22/2012 0405   CO2 25 11/22/2012 0405   GLUCOSE 92 11/22/2012 0405   BUN 9 11/22/2012 0405   CREATININE 0.98 11/22/2012 0405   CREATININE 0.87 08/07/2012 2022   CALCIUM 8.8 11/22/2012 0405   PROT 7.5 11/20/2012 1226   ALBUMIN 3.6 11/20/2012 1226   AST 13 11/20/2012 1226   ALT 11 11/20/2012 1226   ALKPHOS 107 11/20/2012 1226   BILITOT 0.4 11/20/2012 1226   GFRNONAA 90* 11/22/2012 0405   GFRAA >90 11/22/2012 0405   Lipase     Component Value Date/Time   LIPASE 43 07/01/2012 2115       Studies/Results: Dg Abd 2 Views  December 06, 2012  *RADIOLOGY REPORT*  Clinical Data: Evaluate small bowel obstruction, abdominal pain  ABDOMEN - 2 VIEW  Comparison: 11/22/2012; 09/20/2012; CT abdomen pelvis - 11/20/2012  Findings:  Grossly unchanged moderate  dilatation of a rather featureless loop of small bowel within the left mid hemiabdomen with index of measuring approximately 5.1 cm in diameter.  Enteric contrast is again seen within the transverse, descending, sigmoid colon and rectum.  No definite pneumoperitoneum, pneumatosis or portal venous gas.  An enteric tube tip and side port project over the left upper abdominal quadrant.  Radiopaque pills are unchanged in positioning overlying the right lower abdomen.  An enteric central line overlies the right lower abdomen.  Limited visualization of the lower thorax demonstrates sequela of prior granulomatous disease.  No focal airspace opacity or pleural effusion.  Unchanged bones.  IMPRESSION: Grossly unchanged findings compatible with at least a high grade partial small bowel obstruction.   Original Report Authenticated By: Tacey Ruiz, MD    Dg Abd 2 Views  11/22/2012  *RADIOLOGY REPORT*  Clinical Data: Small bowel resection and July 2013, now with lower abdominal pain and small bowel obstruction, follow-up  ABDOMEN - 2 VIEW  Comparison: Abdomen films of 11/21/2012  Findings: There is little change in gaseous distention of small bowel loops measuring up to 5.3 cm in diameter consistent with persistent partial small bowel obstruction.  The colon is decompressed and does contain some contrast with air noted to the rectum.  An NG  tube coils in the fundus of the stomach.  No free air is seen.  IMPRESSION: Persistent small bowel obstruction.  NG tube coils in the fundus of the stomach.   Original Report Authenticated By: Dwyane Dee, M.D.     Anti-infectives: Anti-infectives    None       Assessment/Plan  1. PSBO 2. H/o crohn's disease 3. S/p ileocecectomy for crohn's disease  Plan: 1. The patient clinically seems to be improving.  We will await his abdominal films today to determine where to go from here.  If his films look better, then we may try and clamp his NGT.  If they look worse, then the  patient will likely need a PICC and TNA.   LOS: 4 days    OSBORNE,KELLY E 11/24/2012, 7:52 AM Pager: [phone]  Agree with plan - PICC line placement and TPN. Has been passing flatus.  He actually says he feels pretty good.  If he continues to do this, consider clamping NGT tomorrow AM. Mother in room.  She took notes of what I discussed with the patient.  Ovidio Kin, MD, [facility] Surgery Pager: [phone] Office phone:  [phone]

## 2012-11-25 ENCOUNTER — Inpatient Hospital Stay (HOSPITAL_COMMUNITY): Payer: BC Managed Care – PPO

## 2012-11-25 LAB — DIFFERENTIAL
Basophils Absolute: 0 10*3/uL (ref 0.0–0.1)
Lymphocytes Relative: 22 % (ref 12–46)
Lymphs Abs: 1.7 10*3/uL (ref 0.7–4.0)
Neutro Abs: 4.8 10*3/uL (ref 1.7–7.7)

## 2012-11-25 LAB — GLUCOSE, CAPILLARY
Glucose-Capillary: 103 mg/dL — ABNORMAL HIGH (ref 70–99)
Glucose-Capillary: 118 mg/dL — ABNORMAL HIGH (ref 70–99)

## 2012-11-25 LAB — COMPREHENSIVE METABOLIC PANEL
ALT: 9 U/L (ref 0–53)
AST: 11 U/L (ref 0–37)
Albumin: 2.9 g/dL — ABNORMAL LOW (ref 3.5–5.2)
CO2: 26 mEq/L (ref 19–32)
Chloride: 102 mEq/L (ref 96–112)
Creatinine, Ser: 1.01 mg/dL (ref 0.50–1.35)
GFR calc non Af Amer: 81 mL/min — ABNORMAL LOW (ref >90)
Potassium: 3.7 mEq/L (ref 3.5–5.1)
Sodium: 137 mEq/L (ref 135–145)
Total Bilirubin: 0.3 mg/dL (ref 0.3–1.2)

## 2012-11-25 LAB — CBC
HCT: 37.3 % — ABNORMAL LOW (ref 39.0–52.0)
MCV: 85.6 fL (ref 78.0–100.0)
Platelets: 256 10*3/uL (ref 150–400)
RBC: 4.36 MIL/uL (ref 4.22–5.81)
RDW: 16.5 % — ABNORMAL HIGH (ref 11.5–15.5)
WBC: 7.7 10*3/uL (ref 4.0–10.5)

## 2012-11-25 LAB — TRIGLYCERIDES: Triglycerides: 117 mg/dL (ref <150)

## 2012-11-25 LAB — MAGNESIUM: Magnesium: 2.1 mg/dL (ref 1.5–2.5)

## 2012-11-25 MED ORDER — TRACE MINERALS CR-CU-F-FE-I-MN-MO-SE-ZN IV SOLN
INTRAVENOUS | Status: DC
  Filled 2012-11-25: qty 1000

## 2012-11-25 MED ORDER — ENOXAPARIN SODIUM 40 MG/0.4ML ~~LOC~~ SOLN
40.0000 mg | SUBCUTANEOUS | Status: DC
  Administered 2012-11-25 – 2012-11-29 (×5): 40 mg via SUBCUTANEOUS
  Filled 2012-11-25 (×6): qty 0.4

## 2012-11-25 MED ORDER — CLINIMIX E/DEXTROSE (5/20) 5 % IV SOLN
INTRAVENOUS | Status: AC
  Administered 2012-11-25: 18:00:00 via INTRAVENOUS
  Filled 2012-11-25: qty 2000

## 2012-11-25 NOTE — Progress Notes (Signed)
Removed NG tube per MD order, WNL. Pt tolerated procedure well.

## 2012-11-25 NOTE — Progress Notes (Signed)
Patient ID: Samuel Montgomery, male   DOB: [birth year], 56 y.o.   MRN: [id number] [facility] Progress Note  Subjective: Feeling better-,tolerating NG clamped without any increased pain etc. He has been passing flatus and had a very small bm today. Still trying to decide about possibly having surgery here vs [facility] ... TNA  infusing  Objective:  Vital signs in last 24 hours: Temp:  [97.4 F (36.3 C)-98.7 F (37.1 C)] 98.7 F (37.1 C) (12/28 0505) Pulse Rate:  [65-75] 70  (12/28 0505) Resp:  [16-18] 18  (12/28 0505) BP: (96-111)/(59-70) 107/65 mmHg (12/28 0505) SpO2:  [97 %-100 %] 98 % (12/28 0505) Last BM Date: 11/25/12 General:   Alert,  Well-developed,    in NAD Heart:  Regular rate and rhythm; no murmurs Pulm;clear Abdomen:  Soft, mildly tender and nondistended. Normal bowel sounds, without guarding, and without rebound. -large ventral hernia soft  Extremities:  Without edema. Neurologic:  Alert and  oriented x4;  grossly normal neurologically. Psych:  Alert and cooperative. Normal mood and affect.  Intake/Output from previous day: 12/27 0701 - 12/28 0700 In: 1247.3 [I.V.:689; TPN:558.3] Out: 1500 [Urine:1350; Emesis/NG output:150] Intake/Output this shift: Total I/O In: -  Out: 500 [Urine:500]  Lab Results:  Basename 11/25/12 0500 11/23/12 0405  WBC 7.7 7.6  HGB 12.4* 12.8*  HCT 37.3* 37.9*  PLT 256 238   BMET  Basename 11/25/12 0500 11/24/12 1404  NA 137 136  K 3.7 3.9  CL 102 102  CO2 26 25  GLUCOSE 103* 85  BUN 8 6  CREATININE 1.01 1.10  CALCIUM 9.2 9.2   LFT  Basename 11/25/12 0500  PROT 6.6  ALBUMIN 2.9*  AST 11  ALT 9  ALKPHOS 89  BILITOT 0.3  BILIDIR --  IBILI --        Assessment / Plan: #1  56 yo male s/p ileo-cecectomy 04/2012 for perforated Crohns- now with SBO which has been symptomatic for several months with intermittent pain, nausea/vomiting. He is improving slowly-hopefully NG can come out today-however I am not  optimistic that obstruction is actually resolved- I think he needs lap  Lysis of adhesions and hernia repair. He is deciding about [facility] vs Dr.Newman.   Check films today #2 nutrition- TNA started Active Problems:  Small bowel obstruction     LOS: 5 days   Amy Esterwood  11/25/2012, 8:59 AM  I have personally taken an interval history, reviewed the chart, and examined the patient.  I agree with the extender's note, impression and recommendations.  X-rays improved. Plan to continue TPN. Would defer elective surgery until he becomes nutritionally repleted  Barbette Hair. Arlyce Dice, MD, [facility] [phone]

## 2012-11-25 NOTE — Progress Notes (Signed)
PARENTERAL NUTRITION CONSULT NOTE - FOLLOW-UP  Pharmacy Consult for TNA Indication: SBO  No Known Allergies  Patient Measurements: Height: 5\' 4"  (162.6 cm) Weight: 135 lb 6.4 oz (61.417 kg) IBW/kg (Calculated) : 59.2   Vital Signs: Temp: 98.7 F (37.1 C) (12/28 0505) Temp src: Oral (12/28 0505) BP: 107/65 mmHg (12/28 0505) Pulse Rate: 70  (12/28 0505) Intake/Output from previous day: 12/27 0701 - 12/28 0700 In: 1247.3 [I.V.:689; TPN:558.3] Out: 1500 [Urine:1350; Emesis/NG output:150] Intake/Output from this shift: Total I/O In: -  Out: 500 [Urine:500]  Labs:  [facility] 11/25/12 0500 11/23/12 0405  WBC 7.7 7.6  HGB 12.4* 12.8*  HCT 37.3* 37.9*  PLT 256 238  APTT -- --  INR -- --     Basename 11/25/12 0500 11/24/12 1404  NA 137 136  K 3.7 3.9  CL 102 102  CO2 26 25  GLUCOSE 103* 85  BUN 8 6  CREATININE 1.01 1.10  LABCREA -- --  CREAT24HRUR -- --  CALCIUM 9.2 9.2  MG 2.1 2.0  PHOS 3.8 3.8  PROT 6.6 6.9  ALBUMIN 2.9* 3.2*  AST 11 11  ALT 9 9  ALKPHOS 89 95  BILITOT 0.3 0.4  BILIDIR -- --  IBILI -- --  PREALBUMIN -- --  TRIG 117 --  CHOLHDL -- --  CHOL 142 --   Estimated Creatinine Clearance: 67.6 ml/min (by C-G formula based on Cr of 1.01).   No results found for this basename: GLUCAP:3 in the last 72 hours  Medical History: Past Medical History  Diagnosis Date  . GERD (gastroesophageal reflux disease)   . Psoriasis   . Benign neoplasm of colon 05/20/2005    adenomatous polyp  . Diverticulosis of colon (without mention of hemorrhage)   . COPD (chronic obstructive pulmonary disease)   . Inguinal hernia     bi-lateral  . 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 ? Perianal fistula vs. Abscess    . Adenomatous colon polyp 04/2005    colonoscopy by Dr Leone Payor.     Medications:  Scheduled:     . buPROPion  150 mg Oral Daily  . enoxaparin (LOVENOX) injection  40 mg Subcutaneous Q24H  . mercaptopurine  75 mg Oral  Daily  . sodium chloride  10-40 mL Intracatheter Q12H   Infusions:     . dextrose 5 % and 0.45 % NaCl with KCl 20 mEq/L 60 mL/hr at 11/25/12 0925  . TPN (CLINIMIX) +/- additives 40 mL/hr at 11/24/12 1812   And  . fat emulsion 240 mL (11/24/12 1812)  . [DISCONTINUED] dextrose 5 % and 0.45 % NaCl with KCl 20 mEq/L 100 mL/hr at 11/23/12 1646   PRN: acetaminophen, acetaminophen, HYDROmorphone (DILAUDID) injection, LORazepam, menthol-cetylpyridinium, ondansetron (ZOFRAN) IV, ondansetron, oxyCODONE, sodium chloride  Current Nutrition:  NPO MIVF = dextrose 5 % and 0.45 % NaCl with KCl 20 mEq/L infusion @ 100 ml/hr Clinimix E5/20 @ 33ml/hr with lipids/MVI on MWF d/t shortage (provides 48g protein, 1051 Kcal avg/day)  Insulin Requirements in the past 24 hours:  No hx DM. Not currently on insulin; cBGs well controlled.  85-103  Nutritional Goals:  RD recommendations for estimated nutritional needs: Kcal: 1300-1400 kcals daily  Protein: 49-61 grams protein daily  Fluid: 1.3-1.4 L fluid daily  Assessment: 56 yoM NPO x 6 days d/t SBO.  NG tube in place.   Hx Crohn's with multiple bowel resections.  NG clamp trials on/off q 4 hours.  Per MD note, passing flatus,  small BM.  Possible NGT removal if KUB looks good.  Per Nurse, no issues with TNA, pt tolerating fine.    Electrolytes wnl   LFTs wnl  TGs 117 - WNL.   Prealbumin pending  Renal fxn stable  Plan:   Increase rate ofTNA (Clinimix E 5/20%) to 60 ml/hr, increase to goal as tolerated  Lipids/MVI/TE MWF only due to shortaget  Routine TNA labs Mon and Thurs  Add CBGs q 6 h - follow up as rate of TNA increases  Haynes Hoehn, PharmD 11/25/2012 11:04 AM  Pager: [phone]

## 2012-11-25 NOTE — Progress Notes (Signed)
General Surgery Note  LOS: 5 days  Room - 1344  Assessment/Plan: 1.  Small bowel obstruction   Seems to be slowly getting better  Will recheck KUB today  His NGT is being clamped on and off q 4 hours  If films look good - will remove NGT.  2.  Crohn's disease  EXPLORATORY LAPAROTOMY, ileocectomy, drainage of abscess - T. Gerkin - 05/14/2012   History of abscess at ileo-colonic anastomosis - treated in August 2013.   History of ileo-ileal fistula  3.  DVT prophylaxis - to start lovenox 4.  Malnutrition - on TPN  Subjective:  Doing well, no pain, passing flatus and small BM. Objective:   Filed Vitals:   11/25/12 0505  BP: 107/65  Pulse: 70  Temp: 98.7 F (37.1 C)  Resp: 18     Intake/Output from previous day:  12/27 0701 - 12/28 0700 In: 1247.3 [I.V.:689; TPN:558.3] Out: 1500 [Urine:1350; Emesis/NG output:150]  Intake/Output this shift:  Total I/O In: -  Out: 500 [Urine:500]   Physical Exam:   General: WN WM who is alert and oriented.    HEENT: Normal. Pupils equal. .   Lungs: Clear   Abdomen: Soft.  Hernia in midline, but soft.  BS present.  No tenderness.   Lab Results:    Basename 11/25/12 0500 11/23/12 0405  WBC 7.7 7.6  HGB 12.4* 12.8*  HCT 37.3* 37.9*  PLT 256 238    BMET   Basename 11/25/12 0500 11/24/12 1404  NA 137 136  K 3.7 3.9  CL 102 102  CO2 26 25  GLUCOSE 103* 85  BUN 8 6  CREATININE 1.01 1.10  CALCIUM 9.2 9.2    PT/INR  No results found for this basename: LABPROT:2,INR:2 in the last 72 hours  ABG  No results found for this basename: PHART:2,PCO2:2,PO2:2,HCO3:2 in the last 72 hours   Studies/Results:  Dg Abd 2 Views  11/24/2012  *RADIOLOGY REPORT*  Clinical Data: Follow-up of small bowel obstruction.  ABDOMEN - 2 VIEW  Comparison: 11/23/2012  Findings: NG tube tip is in the fundus of the stomach. There is persistent dilatation of small bowel loops in the mid abdomen. Contrast remains in the nondistended colon.  There are multiple  tablets in the right side of the abdomen, probably ingested medication.  IMPRESSION: Findings consistent with persistent partial small bowel obstruction.   Original Report Authenticated By: Francene Boyers, M.D.    Dg Abd 2 Views  11/23/2012  *RADIOLOGY REPORT*  Clinical Data: Evaluate small bowel obstruction, abdominal pain  ABDOMEN - 2 VIEW  Comparison: 11/22/2012; 09/20/2012; CT abdomen pelvis - 11/20/2012  Findings:  Grossly unchanged moderate dilatation of a rather featureless loop of small bowel within the left mid hemiabdomen with index of measuring approximately 5.1 cm in diameter.  Enteric contrast is again seen within the transverse, descending, sigmoid colon and rectum.  No definite pneumoperitoneum, pneumatosis or portal venous gas.  An enteric tube tip and side port project over the left upper abdominal quadrant.  Radiopaque pills are unchanged in positioning overlying the right lower abdomen.  An enteric central line overlies the right lower abdomen.  Limited visualization of the lower thorax demonstrates sequela of prior granulomatous disease.  No focal airspace opacity or pleural effusion.  Unchanged bones.  IMPRESSION: Grossly unchanged findings compatible with at least a high grade partial small bowel obstruction.   Original Report Authenticated By: Tacey Ruiz, MD      Anti-infectives:   Anti-infectives  None      Ovidio Kin, MD, FACS Pager: [phone],   [facility] Surgery Office: [phone] 11/25/2012

## 2012-11-26 DIAGNOSIS — K5080 Crohn's disease of both small and large intestine without complications: Secondary | ICD-10-CM

## 2012-11-26 MED ORDER — CLINIMIX E/DEXTROSE (5/20) 5 % IV SOLN
INTRAVENOUS | Status: AC
  Administered 2012-11-26: 18:00:00 via INTRAVENOUS
  Filled 2012-11-26: qty 2000

## 2012-11-26 NOTE — Progress Notes (Signed)
General Surgery Note  LOS: 6 days  Room - 1344  Assessment/Plan: 1.  Small bowel obstruction   Seems to be slowly getting better  KUB yesterday looks somewhat better - but still some dilated SB.  NGT came out last night.  He is now on clear liquids.  Looks good.  2.  Crohn's disease  EXPLORATORY LAPAROTOMY, ileocectomy, drainage of abscess - T. Gerkin - 05/14/2012   History of abscess at ileo-colonic anastomosis - treated in August 2013.   History of ileo-ileal fistula.  Main issue will be long term plans.  3.  DVT prophylaxis - to start lovenox 4.  Malnutrition - on TPN  Prealbumin - 14.8 - 11/25/2012  Subjective:  Doing well, no pain.  NGT came out last night.  He is now on clear liquids. Objective:   Filed Vitals:   11/26/12 0641  BP: 108/60  Pulse: 62  Temp: 98.1 F (36.7 C)  Resp: 18     Intake/Output from previous day:  12/28 0701 - 12/29 0700 In: 1558 [I.V.:1078; TPN:480] Out: 1950 [Urine:1950]  Intake/Output this shift:      Physical Exam:   General: WN WM who is alert and oriented.    HEENT: Normal. Pupils equal. .   Lungs: Clear   Abdomen: Soft.  Hernia in midline, but soft.  BS present.  No tenderness.   Lab Results:     Basename 11/25/12 0500  WBC 7.7  HGB 12.4*  HCT 37.3*  PLT 256    BMET    Basename 11/25/12 0500 11/24/12 1404  NA 137 136  K 3.7 3.9  CL 102 102  CO2 26 25  GLUCOSE 103* 85  BUN 8 6  CREATININE 1.01 1.10  CALCIUM 9.2 9.2    PT/INR  No results found for this basename: LABPROT:2,INR:2 in the last 72 hours  ABG  No results found for this basename: PHART:2,PCO2:2,PO2:2,HCO3:2 in the last 72 hours   Studies/Results:  Dg Abd 2 Views  11/25/2012  *RADIOLOGY REPORT*  Clinical Data: Small bowel obstruction.  ABDOMEN - 2 VIEW  Comparison: 11/24/2012  Findings: NG tube remains in stable position within the stomach. Continued mild dilatation of upper abdominal small bowel loops with air-fluid levels.  Dilatation has improved  slightly since prior study.  No free air organomegaly.  Oral contrast material is seen within the left side of the colon, similar to prior study.  Calcified granulomas in the left lower lobe and left hilum.  Right lung base clear.  Heart is normal size.  IMPRESSION: Slight improvement in small bowel obstruction pattern.   Original Report Authenticated By: Charlett Nose, M.D.    Dg Abd 2 Views  11/24/2012  *RADIOLOGY REPORT*  Clinical Data: Follow-up of small bowel obstruction.  ABDOMEN - 2 VIEW  Comparison: 11/23/2012  Findings: NG tube tip is in the fundus of the stomach. There is persistent dilatation of small bowel loops in the mid abdomen. Contrast remains in the nondistended colon.  There are multiple tablets in the right side of the abdomen, probably ingested medication.  IMPRESSION: Findings consistent with persistent partial small bowel obstruction.   Original Report Authenticated By: Francene Boyers, M.D.      Anti-infectives:   Anti-infectives    None      Ovidio Kin, MD, FACS Pager: [phone],   [facility] Surgery Office: [phone] 11/26/2012

## 2012-11-26 NOTE — Progress Notes (Signed)
Patient ID: Samuel Montgomery, male   DOB: [birth year], 56 y.o.   MRN: [id number] [facility] Progress Note  Subjective: Ng out since last night, trying clear liquids.  He is having some liquid Bm, and flatus. Starting to get abdominal cramping again,some nausea this am. TPN infusing  KUB 12/28- slight improvement in SBO  Objective:  Vital signs in last 24 hours: Temp:  [97.4 F (36.3 C)-98.1 F (36.7 C)] 98.1 F (36.7 C) (12/29 0641) Pulse Rate:  [51-62] 62  (12/29 0641) Resp:  [18-20] 18  (12/29 0641) BP: (105-111)/(55-63) 108/60 mmHg (12/29 0641) SpO2:  [97 %-99 %] 99 % (12/29 0641) Last BM Date: 11/26/12 General:   Alert,  Well-developed,    in NAD Heart:  Regular rate and rhythm; no murmurs Pulm;clear Abdomen:  Soft,tender mid abdomen and RLQ,and nondistended, Large ventral hernia,Bs Somewhat hyperactive. Extremities:  Without edema. Neurologic:  Alert and  oriented x4;  grossly normal neurologically. Psych:  Alert and cooperative. Normal mood and affect.  Intake/Output from previous day: 12/28 0701 - 12/29 0700 In: 1558 [I.V.:1078; TPN:480] Out: 1950 [Urine:1950] Intake/Output this shift:    Lab Results:  Basename 11/25/12 0500  WBC 7.7  HGB 12.4*  HCT 37.3*  PLT 256   BMET  Basename 11/25/12 0500 11/24/12 1404  NA 137 136  K 3.7 3.9  CL 102 102  CO2 26 25  GLUCOSE 103* 85  BUN 8 6  CREATININE 1.01 1.10  CALCIUM 9.2 9.2   LFT  Basename 11/25/12 0500  PROT 6.6  ALBUMIN 2.9*  AST 11  ALT 9  ALKPHOS 89  BILITOT 0.3  BILIDIR --  IBILI --     Assessment / Plan: #1  56 yo male with Crohns disease-s/p perforated terminal ileum -ileocecectomy 04/2012, and admission for abscess 06/2012. Pt has had ongoing abdominal pain over the past 4 months with intermittent vomiting consistent with ongoing partial  SBO., despite and Humira.   Improved somewhat now s/p NG decompression x 5 days but films still show SBO, and he is having increased sxs with  trial of liquids- I think he needs surgery , and may do best with [facility] to [facility] this coming week.  He has an ileo-ileo fistula, and multiple loops of tethered small  In RLQ.  Will repeat films in am #2 Nutrition- continue TPN #3 DVT prophylaxis- on lovenox Active Problems:  Small bowel obstruction     LOS: 6 days   Amy Esterwood  11/26/2012, 10:01 AM  He is now having bowel movements and passing flatus although developed crampy abdominal pain with clears. Altogether there has been very slow improvement in his obstruction. Plan to continue TPN and clear liquids only.

## 2012-11-26 NOTE — Progress Notes (Signed)
PARENTERAL NUTRITION CONSULT NOTE - FOLLOW-UP  Pharmacy Consult for TNA Indication: SBO  No Known Allergies  Patient Measurements: Height: 5\' 4"  (162.6 cm) Weight: 135 lb 6.4 oz (61.417 kg) IBW/kg (Calculated) : 59.2   Vital Signs: Temp: 98.1 F (36.7 C) (12/29 0641) Temp src: Oral (12/29 0641) BP: 108/60 mmHg (12/29 0641) Pulse Rate: 62  (12/29 0641) Intake/Output from previous day: 12/28 0701 - 12/29 0700 In: 1558 [I.V.:1078; TPN:480] Out: 1950 [Urine:1950] Intake/Output from this shift: Total I/O In: -  Out: 1000 [Urine:1000]  Labs:  Basename 11/25/12 0500  WBC 7.7  HGB 12.4*  HCT 37.3*  PLT 256  APTT --  INR --     Basename 11/25/12 0500 11/24/12 1404  NA 137 136  K 3.7 3.9  CL 102 102  CO2 26 25  GLUCOSE 103* 85  BUN 8 6  CREATININE 1.01 1.10  LABCREA -- --  CREAT24HRUR -- --  CALCIUM 9.2 9.2  MG 2.1 2.0  PHOS 3.8 3.8  PROT 6.6 6.9  ALBUMIN 2.9* 3.2*  AST 11 11  ALT 9 9  ALKPHOS 89 95  BILITOT 0.3 0.4  BILIDIR -- --  IBILI -- --  PREALBUMIN 14.8* --  TRIG 117 --  CHOLHDL -- --  CHOL 142 --   Estimated Creatinine Clearance: 67.6 ml/min (by C-G formula based on Cr of 1.01).    Basename 11/26/12 1201 11/26/12 0635 11/25/12 1751  GLUCAP 114* 110* 118*    Medical History: Past Medical History  Diagnosis Date  . GERD (gastroesophageal reflux disease)   . Psoriasis   . Benign neoplasm of colon 05/20/2005    adenomatous polyp  . Diverticulosis of colon (without mention of hemorrhage)   . COPD (chronic obstructive pulmonary disease)   . Inguinal hernia     bi-lateral  . 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 ? Perianal fistula vs. Abscess    . Adenomatous colon polyp 04/2005    colonoscopy by Dr Leone Payor.     Medications:  Scheduled:     . buPROPion  150 mg Oral Daily  . enoxaparin (LOVENOX) injection  40 mg Subcutaneous Q24H  . mercaptopurine  75 mg Oral Daily  . sodium chloride  10-40 mL Intracatheter  Q12H   Infusions:     . dextrose 5 % and 0.45 % NaCl with KCl 20 mEq/L 60 mL/hr at 11/26/12 0302  . [EXPIRED] TPN (CLINIMIX) +/- additives 40 mL/hr at 11/24/12 1812   And  . [EXPIRED] fat emulsion 240 mL (11/24/12 1812)  . TPN (CLINIMIX) +/- additives 60 mL/hr at 11/25/12 1733   PRN: acetaminophen, acetaminophen, HYDROmorphone (DILAUDID) injection, LORazepam, menthol-cetylpyridinium, ondansetron (ZOFRAN) IV, ondansetron, oxyCODONE, sodium chloride  Current Nutrition:  Clear liquids MIVF = dextrose 5 % and 0.45 % NaCl with KCl 20 mEq/L infusion @ 100 ml/hr Clinimix E5/20 @ 52ml/hr with lipids/MVI on MWF d/t shortage (provides 72g protein, 1473 avg/day sun-sat)  Insulin Requirements in the past 24 hours:  No hx DM. Not currently on insulin; cBGs well controlled.   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 Hx Crohn's with multiple bowel resections.  KUB 12/28 -noted slight improvement in SBO.  NGT removed last night. On clears. Per MD note, passing flatus, liquid BM, some abd cramping and nausea.  Noted GIs thoughts on possible tx  to [facility] for surgery.  KBU repeat for 12/30.    Electrolytes wnl   LFTs wnl  TGs 117 - WNL.   Prealbumin 14.8  Renal fxn stable  cBGs well controlled  Plan:   Decrease TNA (Clinimix E 5/20%) slighlty from 72ml/hr to 55 ml/hr to lower KCal/protein to match RD recommendations.   Lipids/MVI/TE MWF only due to shortaget  Routine TNA labs Mon and Charlcie Cradle, PharmD 11/26/2012 12:10 PM  Pager: [phone]

## 2012-11-27 ENCOUNTER — Inpatient Hospital Stay (HOSPITAL_COMMUNITY): Payer: BC Managed Care – PPO

## 2012-11-27 LAB — CBC
Hemoglobin: 12.8 g/dL — ABNORMAL LOW (ref 13.0–17.0)
MCH: 29 pg (ref 26.0–34.0)
MCHC: 33.8 g/dL (ref 30.0–36.0)
Platelets: 253 10*3/uL (ref 150–400)
RDW: 16.6 % — ABNORMAL HIGH (ref 11.5–15.5)

## 2012-11-27 LAB — DIFFERENTIAL
Eosinophils Absolute: 0.3 10*3/uL (ref 0.0–0.7)
Lymphs Abs: 1.4 10*3/uL (ref 0.7–4.0)
Neutro Abs: 8.2 10*3/uL — ABNORMAL HIGH (ref 1.7–7.7)
Neutrophils Relative %: 73 % (ref 43–77)

## 2012-11-27 LAB — COMPREHENSIVE METABOLIC PANEL
Alkaline Phosphatase: 94 U/L (ref 39–117)
BUN: 13 mg/dL (ref 6–23)
CO2: 25 mEq/L (ref 19–32)
Chloride: 101 mEq/L (ref 96–112)
Creatinine, Ser: 0.87 mg/dL (ref 0.50–1.35)
GFR calc Af Amer: >90 mL/min (ref >90)
GFR calc non Af Amer: >90 mL/min (ref >90)
Glucose, Bld: 108 mg/dL — ABNORMAL HIGH (ref 70–99)
Potassium: 3.9 mEq/L (ref 3.5–5.1)
Total Bilirubin: 0.5 mg/dL (ref 0.3–1.2)

## 2012-11-27 LAB — TRIGLYCERIDES: Triglycerides: 73 mg/dL (ref <150)

## 2012-11-27 LAB — GLUCOSE, CAPILLARY
Glucose-Capillary: 109 mg/dL — ABNORMAL HIGH (ref 70–99)
Glucose-Capillary: 121 mg/dL — ABNORMAL HIGH (ref 70–99)

## 2012-11-27 LAB — MAGNESIUM: Magnesium: 2 mg/dL (ref 1.5–2.5)

## 2012-11-27 LAB — CHOLESTEROL, TOTAL: Cholesterol: 111 mg/dL (ref 0–200)

## 2012-11-27 LAB — PREALBUMIN: Prealbumin: 13.1 mg/dL — ABNORMAL LOW (ref 17.0–34.0)

## 2012-11-27 MED ORDER — FAT EMULSION 20 % IV EMUL
250.0000 mL | INTRAVENOUS | Status: AC
  Administered 2012-11-27: 250 mL via INTRAVENOUS
  Filled 2012-11-27: qty 250

## 2012-11-27 MED ORDER — M.V.I. ADULT IV INJ
INJECTION | INTRAVENOUS | Status: AC
  Administered 2012-11-27: 18:00:00 via INTRAVENOUS
  Filled 2012-11-27: qty 2000

## 2012-11-27 NOTE — Progress Notes (Signed)
[facility] Progress Note  Subjective:  Feels better today.  Tolerating clears.  Is hungry.  Passing flatus and small amounts of liquid stool.  NGT removed 12/28.  Objective:  Vital signs in last 24 hours: Temp:  [97.8 F (36.6 C)-98.2 F (36.8 C)] 97.8 F (36.6 C) (12/30 0500) Pulse Rate:  [58-60] 58  (12/30 0500) Resp:  [18-20] 20  (12/30 0500) BP: (100-115)/(62-65) 115/62 mmHg (12/30 0500) SpO2:  [99 %-100 %] 99 % (12/30 0500) Last BM Date: 11/26/12 General:   Alert, Well-developed, in NAD Heart:  Regular rate and rhythm; no murmurs Pulm:  CTAB.  No W/R/R. Abdomen:  Soft, non-distended.  BS present.  Some TTP diffusely.  Large ventral/incisional hernia noted; is reducible.  Mid-line laparotomy scar noted. Extremities:  Without edema. Neurologic:  Alert and  oriented x4;  grossly normal neurologically. Psych:  Alert and cooperative. Normal mood and affect.  Intake/Output from previous day: 12/29 0701 - 12/30 0700 In: 4718.9 [P.O.:920; I.V.:1945; TPN:1853.9] Out: 3375 [Urine:3375]  Lab Results:  Basename 11/27/12 0630 11/25/12 0500  WBC 11.3* 7.7  HGB 12.8* 12.4*  HCT 37.9* 37.3*  PLT 253 256   BMET  Basename 11/27/12 0630 11/25/12 0500 11/24/12 1404  NA 133* 137 136  K 3.9 3.7 3.9  CL 101 102 102  CO2 25 26 25   GLUCOSE 108* 103* 85  BUN 13 8 6   CREATININE 0.87 1.01 1.10  CALCIUM 9.0 9.2 9.2   LFT  Basename 11/27/12 0630  PROT 7.0  ALBUMIN 2.9*  AST 9  ALT 6  ALKPHOS 94  BILITOT 0.5  BILIDIR --  IBILI --   Dg Abd 2 Views  11/27/2012  *RADIOLOGY REPORT*  Clinical Data: Partial small bowel obstruction.  ABDOMEN - 2 VIEW  Comparison: 11/25/2012  Findings: Upright film shows no evidence for intraperitoneal free air.  NG tube has been removed in the interval.  Supine film shows some scattered mildly distended air filled small bowel loops measuring up to 4.0 cm in diameter.  The colonic contrast seen on the previous study has been evacuated in the  interval and there is a small amount of gas visible in the rectum.  IMPRESSION: No intraperitoneal free air.  Mild distention of left upper quadrant small bowel. A component of partial obstruction or focal ileus may persist but there is no evidence on the current exam for an overt high-grade small bowel obstruction.   Original Report Authenticated By: Kennith Center, M.D.    Dg Abd 2 Views  11/25/2012  *RADIOLOGY REPORT*  Clinical Data: Small bowel obstruction.  ABDOMEN - 2 VIEW  Comparison: 11/24/2012  Findings: NG tube remains in stable position within the stomach. Continued mild dilatation of upper abdominal small bowel loops with air-fluid levels.  Dilatation has improved slightly since prior study.  No free air organomegaly.  Oral contrast material is seen within the left side of the colon, similar to prior study.  Calcified granulomas in the left lower lobe and left hilum.  Right lung base clear.  Heart is normal size.  IMPRESSION: Slight improvement in small bowel obstruction pattern.   Original Report Authenticated By: Charlett Nose, M.D.     Assessment / Plan: #1 56 yo male with Crohns disease-s/p perforated terminal ileum -ileocecectomy 04/2012, and admission for abscess 06/2012. Pt has had ongoing abdominal pain over the past 4 months with intermittent vomiting consistent with ongoing partial SBO despite and Humira.  Films show maybe somewhat improved SBO and clinically he looks  better as well.  Tolerating clears.  Will advance diet to full liquids.  Plan is to try to get him out of the hospital without surgery and outpatient follow-up at [facility]. 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  #2 Nutrition- continue TPN  #3 DVT prophylaxis- on lovenox     LOS: 7 days   ZEHR, JESSICA D.  11/27/2012, 9:37 AM  Pager number [phone]   I have reviewed the above note, examined the patient and agree with plan of treatment.Overall improved but KUB still shows  persistent dilation of the loop of the small bowl in LUQ, WBC 11.300 slightly higher but no fever. I agree with holding off on CT enterography and advancing to full liquids.  [facility] Pager # [phone]

## 2012-11-27 NOTE — Progress Notes (Signed)
Patient ID: ALDRICK DERRIG, male   DOB: 04/21/1956, 56 y.o.   MRN: [id number]    Subjective: Pt feels well.  No abdominal pain.  Tolerating clear liquids with multiple small BMs.  Objective: Vital signs in last 24 hours: Temp:  [97.8 F (36.6 C)-98.2 F (36.8 C)] 97.8 F (36.6 C) December 23, 2023 0500) Pulse Rate:  [58-60] 58  12-23-2023 0500) Resp:  [18-20] 20  12/23/23 0500) BP: (100-115)/(62-65) 115/62 mmHg 23-Dec-2023 0500) SpO2:  [99 %-100 %] 99 % December 23, 2023 0500) Last BM Date: 11/26/12  Intake/Output from previous day: 12/29 0701 - December 23, 2023 0700 In: 4718.9 [P.O.:920; I.V.:1945; TPN:1853.9] Out: 3375 [Urine:3375] Intake/Output this shift:    PE: Abd: soft, NT, ND, hernia reducible  Lab Results:   Basename Dec 22, 2012 0630 11/25/12 0500  WBC 11.3* 7.7  HGB 12.8* 12.4*  HCT 37.9* 37.3*  PLT 253 256   BMET  Basename 12/22/2012 0630 11/25/12 0500  NA 133* 137  K 3.9 3.7  CL 101 102  CO2 25 26  GLUCOSE 108* 103*  BUN 13 8  CREATININE 0.87 1.01  CALCIUM 9.0 9.2   PT/INR No results found for this basename: LABPROT:2,INR:2 in the last 72 hours CMP     Component Value Date/Time   NA 133* 12-22-2012 0630   K 3.9 22-Dec-2012 0630   CL 101 Dec 22, 2012 0630   CO2 25 December 22, 2012 0630   GLUCOSE 108* 12-22-2012 0630   BUN 13 Dec 22, 2012 0630   CREATININE 0.87 12-22-2012 0630   CREATININE 0.87 08/07/2012 2022   CALCIUM 9.0 22-Dec-2012 0630   PROT 7.0 December 22, 2012 0630   ALBUMIN 2.9* 2012/12/22 0630   AST 9 22-Dec-2012 0630   ALT 6 2012/12/22 0630   ALKPHOS 94 2012/12/22 0630   BILITOT 0.5 22-Dec-2012 0630   GFRNONAA >90 12/22/2012 0630   GFRAA >90 12/22/2012 0630   Lipase     Component Value Date/Time   LIPASE 43 07/01/2012 2115       Studies/Results: Dg Abd 2 Views  December 22, 2012  *RADIOLOGY REPORT*  Clinical Data: Partial small bowel obstruction.  ABDOMEN - 2 VIEW  Comparison: 11/25/2012  Findings: Upright film shows no evidence for intraperitoneal free air.  NG tube has been removed in the  interval.  Supine film shows some scattered mildly distended air filled small bowel loops measuring up to 4.0 cm in diameter.  The colonic contrast seen on the previous study has been evacuated in the interval and there is a small amount of gas visible in the rectum.  IMPRESSION: No intraperitoneal free air.  Mild distention of left upper quadrant small bowel. A component of partial obstruction or focal ileus may persist but there is no evidence on the current exam for an overt high-grade small bowel obstruction.   Original Report Authenticated By: Kennith Center, M.D.    Dg Abd 2 Views  11/25/2012  *RADIOLOGY REPORT*  Clinical Data: Small bowel obstruction.  ABDOMEN - 2 VIEW  Comparison: 11/24/2012  Findings: NG tube remains in stable position within the stomach. Continued mild dilatation of upper abdominal small bowel loops with air-fluid levels.  Dilatation has improved slightly since prior study.  No free air organomegaly.  Oral contrast material is seen within the left side of the colon, similar to prior study.  Calcified granulomas in the left lower lobe and left hilum.  Right lung base clear.  Heart is normal size.  IMPRESSION: Slight improvement in small bowel obstruction pattern.   Original Report Authenticated By: Charlett Nose, M.D.  Anti-infectives: Anti-infectives    None       Assessment/Plan  1. SBO, resolving  Plan: 1. Still has a small loop of dilated bowel on CT scan, but tolerating a diet with multiple BMs.  Will try full liquids today.  Hopefully he will tolerate this and can be advanced tomorrow.  If he does not tolerate full liquids, he will likely require an operation. 2. Would start weaning TNA once tolerates full liquids.   LOS: 7 days    Yonael Tulloch E 11/27/2012, 9:16 AM Pager: [phone]

## 2012-11-27 NOTE — Progress Notes (Signed)
Agree with above.  Adv to FLD. Hopefull will open up and avoid an operation.

## 2012-11-27 NOTE — Progress Notes (Signed)
PARENTERAL NUTRITION CONSULT NOTE   Pharmacy Consult for TNA Indication: SBO  No Known Allergies  Patient Measurements: Height: 5\' 4"  (162.6 cm) Weight: 135 lb 6.4 oz (61.417 kg) IBW/kg (Calculated) : 59.2   Vital Signs: Temp: 97.8 F (36.6 C) (12/30 0500) Temp src: Oral (12/29 2037) BP: 115/62 mmHg (12/30 0500) Pulse Rate: 58  (12/30 0500) Intake/Output from previous day: 12/29 0701 - 12/30 0700 In: 4718.9 [P.O.:920; I.V.:1945; TPN:1853.9] Out: 2950 [Urine:2950] I/O: (+) 1769 Intake/Output from this shift:    Labs:  Basename 11/27/12 0630 11/25/12 0500  WBC 11.3* 7.7  HGB 12.8* 12.4*  HCT 37.9* 37.3*  PLT 253 256  APTT -- --  INR -- --     Basename 11/27/12 0630 11/25/12 0500 11/24/12 1404  NA 133* 137 136  K 3.9 3.7 3.9  CL 101 102 102  CO2 25 26 25   GLUCOSE 108* 103* 85  BUN 13 8 6   CREATININE 0.87 1.01 1.10  LABCREA -- -- --  CREAT24HRUR -- -- --  CALCIUM 9.0 9.2 9.2  MG 2.0 2.1 2.0  PHOS 3.4 3.8 3.8  PROT 7.0 6.6 6.9  ALBUMIN 2.9* 2.9* 3.2*  AST 9 11 11   ALT 6 9 9   ALKPHOS 94 89 95  BILITOT 0.5 0.3 0.4  BILIDIR -- -- --  IBILI -- -- --  PREALBUMIN -- 14.8* --  TRIG -- 117 --  CHOLHDL -- -- --  CHOL -- 142 --   Estimated Creatinine Clearance: 78.4 ml/min (by C-G formula based on Cr of 0.87).    Basename 11/27/12 0708 11/26/12 1817 11/26/12 1201  GLUCAP 109* 129* 114*    Medical History: Past Medical History  Diagnosis Date  . GERD (gastroesophageal reflux disease)   . Psoriasis   . Benign neoplasm of colon 05/20/2005    adenomatous polyp  . Diverticulosis of colon (without mention of hemorrhage)   . COPD (chronic obstructive pulmonary disease)   . Inguinal hernia     bi-lateral  . 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 ? Perianal fistula vs. Abscess    . Adenomatous colon polyp 04/2005    colonoscopy by Dr Leone Payor.     Medications:  Scheduled:     . buPROPion  150 mg Oral Daily  . enoxaparin  (LOVENOX) injection  40 mg Subcutaneous Q24H  . mercaptopurine  75 mg Oral Daily  . sodium chloride  10-40 mL Intracatheter Q12H   Infusions:     . dextrose 5 % and 0.45 % NaCl with KCl 20 mEq/L 60 mL/hr at 11/26/12 1920  . [EXPIRED] TPN (CLINIMIX) +/- additives 60 mL/hr at 11/25/12 1733  . TPN (CLINIMIX) +/- additives 55 mL/hr at 11/26/12 1735   PRN: acetaminophen, acetaminophen, HYDROmorphone (DILAUDID) injection, LORazepam, menthol-cetylpyridinium, ondansetron (ZOFRAN) IV, ondansetron, oxyCODONE, sodium chloride  Current Nutrition:  Clear liquid diet MIVF = dextrose 5 % and 0.45 % NaCl with KCl 20 mEq/L infusion @ 60 ml/hr Clinimix-E 5/20 @ 55 ml/hr with lipids/MVI on MWF only (d/t Sport and exercise psychologist) (provides 66 g protein/d, 1368 KCal avg/day sun-sat)  Insulin Requirements in the past 24 hours:  Not currently on insulin. CBGs well controlled.   Nutritional Goals:  RD recommendations for estimated nutritional needs: Kcal: 1300-1400 kcals daily  Protein: 49-61 grams protein daily  Fluid: 1.3-1.4 L fluid daily  Assessment: 73 yoM with SBO, ileo-ileo fistula, began TPN 12/27. Hx Crohn's with multiple bowel resections.  KUB 12/28 -noted slight improvement in SBO.  For repeat  KUB today.  Electrolytes wnl except Na slightly below LLN  LFTs wnl  TGs WNL 12/28, today's value pending  Prealbumin 14.8 on 12/28, today's value pending  SCr stable, WNL  CBGs well controlled, not requiring any insulin Tolerating TNA at goal rate.   Plan:   Continue Clinimix-E 5/20 at current rate (55 ml/hr)   Lipids 20% at 10 mL/hr MWF only due to national shortage  MVI/TE on MWF only due to national shortage  Routine TNA labs Mondays and Thursdays   Elie Goody, PharmD, BCPS Pager: [phone] 11/27/2012  7:57 AM

## 2012-11-27 NOTE — Care Management Note (Unsigned)
    Page 1 of 1   11/27/2012     12:57:41 PM   CARE MANAGEMENT NOTE 11/27/2012  Patient:  Samuel Montgomery, Samuel Montgomery   Account Number:  [id number]  Date Initiated:  11/21/2012  Documentation initiated by:  PEARSON,COOKIE  Subjective/Objective Assessment:   pt admitted with SBO     Action/Plan:   from home   Anticipated DC Date:  11/30/2012   Anticipated DC Plan:  HOME/SELF CARE      DC Planning Services  CM consult      Choice offered to / List presented to:             Status of service:  In process, will continue to follow Medicare Important Message given?  NA - LOS <3 / Initial given by admissions (If response is "NO", the following Medicare IM given date fields will be blank) Date Medicare IM given:   Date Additional Medicare IM given:    Discharge Disposition:    Per UR Regulation:  Reviewed for med. necessity/level of care/duration of stay  If discussed at Long Length of Stay Meetings, dates discussed:    Comments:  11/27/12 [facility] RN,BSN NCM [phone] SBO RESOLVING,NGT D/C,TOLERATING CLEAR LIQ,ADVANCING DIET,WEANING TNA NOW @ 55CC/HR.  11/24/12 MPearson, RN, BSN Pt plan to dc home at discharge.

## 2012-11-28 ENCOUNTER — Inpatient Hospital Stay (HOSPITAL_COMMUNITY): Payer: BC Managed Care – PPO

## 2012-11-28 LAB — GLUCOSE, CAPILLARY: Glucose-Capillary: 109 mg/dL — ABNORMAL HIGH (ref 70–99)

## 2012-11-28 MED ORDER — CLINIMIX E/DEXTROSE (5/20) 5 % IV SOLN
INTRAVENOUS | Status: AC
  Administered 2012-11-28: 18:00:00 via INTRAVENOUS
  Filled 2012-11-28: qty 1000

## 2012-11-28 NOTE — Progress Notes (Addendum)
NUTRITION FOLLOW UP  Intervention:   - Encouraged continued excellent PO intake - RD to continue to monitor   Nutrition Dx:   Inadequate oral intake r/t altered GI function as evidenced by SBO - improving.   Goal:   1) Pt will maintain current wt of 135# - unsure if this is met as pt without any new weights since admission  2) Pt will meet >75% of estimated energy needs - met  3) Pt will be advanced to PO diet as medically appropriate - met, diet advanced to full liquids.   Monitor:   Weights, labs, intake, diet advancement, TPN, BM  Assessment:   NGT d/c 12/28. Noted pt advanced to full liquid diet yesterday. Pt reports he has been tolerating the diet well with 75-100% meal intake. GI noted pt with improved SBO and clinical improvement. Noted plans to begin weaning TPN tonight.   TPN:  Clinimix E 5/20 @ 55 ml/hr.  Lipids (20% IVFE @ 10 ml/hr), multivitamins, and trace elements are provided 3 times weekly (MWF) due to national backorder.  Provides 1368 kcal and 66 grams protein daily (based on weekly average).  Meets 105% of previously estimated minimum estimated kcal and 134% of previously estimated minimum estimated protein needs.  Additional IVF with D5 1/2 NS @ 60 ml/hr.  Height: Ht Readings from Last 1 Encounters:  11/20/12 5\' 4"  (1.626 m)    Weight Status:   Wt Readings from Last 1 Encounters:  11/20/12 135 lb 6.4 oz (61.417 kg)    Re-estimated needs:  Kcal: 1525-1850 Protein: 61-73g Fluid: > 1.8L/day   Diet Order: Full Liquid   Intake/Output Summary (Last 24 hours) at 11/28/12 1455 Last data filed at 11/28/12 0700  Gross per 24 hour  Intake 4446.33 ml  Output   1750 ml  Net 2696.33 ml    Last BM: 12/29   Labs:   Lab 11/27/12 0630 11/25/12 0500 11/24/12 1404  NA 133* 137 136  K 3.9 3.7 3.9  CL 101 102 102  CO2 25 26 25   BUN 13 8 6   CREATININE 0.87 1.01 1.10  CALCIUM 9.0 9.2 9.2  MG 2.0 2.1 2.0  PHOS 3.4 3.8 3.8  GLUCOSE 108* 103* 85    CBG  (last 3)   Basename 11/28/12 1203 11/28/12 0633 11/27/12 2319  GLUCAP 111* 109* 121*    Scheduled Meds:   . buPROPion  150 mg Oral Daily  . enoxaparin (LOVENOX) injection  40 mg Subcutaneous Q24H  . mercaptopurine  75 mg Oral Daily  . sodium chloride  10-40 mL Intracatheter Q12H    Continuous Infusions:   . dextrose 5 % and 0.45 % NaCl with KCl 20 mEq/L 60 mL/hr at 11/28/12 0314  . TPN (CLINIMIX) +/- additives 55 mL/hr at 11/27/12 1735   And  . fat emulsion 250 mL (11/27/12 1735)  . TPN [facility]) +/- additives      Levon Hedger MS, RD, LDN [phone] Pager [phone] After Hours Pager

## 2012-11-28 NOTE — Progress Notes (Signed)
Patient ID: Samuel Montgomery, male   DOB: [birth year], 56 y.o.   MRN: [id number]    Subjective: Pt feels ok, states he got some slight bloating after he ate supper last night.  Still having some flatus.  Objective: Vital signs in last 24 hours: Temp:  [97.9 F (36.6 C)-98.2 F (36.8 C)] 97.9 F (36.6 C) (12/31 4098) Pulse Rate:  [57-68] 57  (12/31 0638) Resp:  [16-18] 16  (12/31 1191) BP: (101-128)/(52-66) 101/52 mmHg (12/31 0638) SpO2:  [97 %-98 %] 98 % (12/31 0638) Last BM Date: 11/26/12  Intake/Output from previous day: 12/19/23 0701 - 12/31 0700 In: 4806.3 [P.O.:1320; I.V.:1440; TPN:2046.3] Out: 1750 [Urine:1750] Intake/Output this shift:    PE: Abd: soft, hernia still soft and reducible, +BS, ND  Lab Results:   Basename December 18, 2012 0630  WBC 11.3*  HGB 12.8*  HCT 37.9*  PLT 253   BMET  Basename 2012/12/18 0630  NA 133*  K 3.9  CL 101  CO2 25  GLUCOSE 108*  BUN 13  CREATININE 0.87  CALCIUM 9.0   PT/INR No results found for this basename: LABPROT:2,INR:2 in the last 72 hours CMP     Component Value Date/Time   NA 133* 18-Dec-2012 0630   K 3.9 Dec 18, 2012 0630   CL 101 2012-12-18 0630   CO2 25 2012/12/18 0630   GLUCOSE 108* 12-18-12 0630   BUN 13 18-Dec-2012 0630   CREATININE 0.87 12-18-2012 0630   CREATININE 0.87 08/07/2012 2022   CALCIUM 9.0 December 18, 2012 0630   PROT 7.0 2012/12/18 0630   ALBUMIN 2.9* 12/18/12 0630   AST 9 2012-12-18 0630   ALT 6 12/18/2012 0630   ALKPHOS 94 2012-12-18 0630   BILITOT 0.5 2012-12-18 0630   GFRNONAA >90 12-18-12 0630   GFRAA >90 12/18/2012 0630   Lipase     Component Value Date/Time   LIPASE 43 07/01/2012 2115       Studies/Results: Dg Abd 2 Views  12/18/2012  *RADIOLOGY REPORT*  Clinical Data: Partial small bowel obstruction.  ABDOMEN - 2 VIEW  Comparison: 11/25/2012  Findings: Upright film shows no evidence for intraperitoneal free air.  NG tube has been removed in the interval.  Supine film shows some scattered  mildly distended air filled small bowel loops measuring up to 4.0 cm in diameter.  The colonic contrast seen on the previous study has been evacuated in the interval and there is a small amount of gas visible in the rectum.  IMPRESSION: No intraperitoneal free air.  Mild distention of left upper quadrant small bowel. A component of partial obstruction or focal ileus may persist but there is no evidence on the current exam for an overt high-grade small bowel obstruction.   Original Report Authenticated By: Kennith Center, M.D.     Anti-infectives: Anti-infectives    None       Assessment/Plan  1. PSBO 2. Crohn's disease  Plan: 1. GI has ordered an UGI with SBFT.  Will see what this shows.  The patient is still passing some flatus and does not appear more distended to me today than he did yesterday.  Will follow.   LOS: 8 days    [name] E 11/28/2012, 11:05 AM Pager: [phone]

## 2012-11-28 NOTE — Progress Notes (Signed)
Agree with above.  Pt looks good and passing gas.  UGI pending Adv PO if UGI ok

## 2012-11-28 NOTE — Progress Notes (Signed)
PARENTERAL NUTRITION CONSULT NOTE   Pharmacy Consult for TNA Indication: SBO  No Known Allergies  Patient Measurements: Height: 5\' 4"  (162.6 cm) Weight: 135 lb 6.4 oz (61.417 kg) IBW/kg (Calculated) : 59.2   Vital Signs: Temp: 97.9 F (36.6 C) (12/31 4098) Temp src: Oral (12/31 1191) BP: 101/52 mmHg (12/31 4782) Pulse Rate: 57  (12/31 9562) Intake/Output from previous day: 12/30 0701 - 12/31 0700 In: 4806.3 [P.O.:1320; I.V.:1440; TPN:2046.3] Out: 1750 [Urine:1750] I/O: (+) 3056 Intake/Output from this shift:    Labs:  Basename 11/27/12 0630  WBC 11.3*  HGB 12.8*  HCT 37.9*  PLT 253  APTT --  INR --     Basename 11/27/12 0630  NA 133*  K 3.9  CL 101  CO2 25  GLUCOSE 108*  BUN 13  CREATININE 0.87  LABCREA --  CREAT24HRUR --  CALCIUM 9.0  MG 2.0  PHOS 3.4  PROT 7.0  ALBUMIN 2.9*  AST 9  ALT 6  ALKPHOS 94  BILITOT 0.5  BILIDIR --  IBILI --  PREALBUMIN 13.1*  TRIG 73  CHOLHDL --  CHOL 111   Estimated Creatinine Clearance: 78.4 ml/min (by C-G formula based on Cr of 0.87).    Basename 11/28/12 1308 11/27/12 2319 11/27/12 1806  GLUCAP 109* 121* 111*    Medical History: Past Medical History  Diagnosis Date  . GERD (gastroesophageal reflux disease)   . Psoriasis   . Benign neoplasm of colon 05/20/2005    adenomatous polyp  . Diverticulosis of colon (without mention of hemorrhage)   . COPD (chronic obstructive pulmonary disease)   . Inguinal hernia     bi-lateral  . 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 ? Perianal fistula vs. Abscess    . Adenomatous colon polyp 04/2005    colonoscopy by Dr Leone Payor.     Medications:  Scheduled:     . buPROPion  150 mg Oral Daily  . enoxaparin (LOVENOX) injection  40 mg Subcutaneous Q24H  . mercaptopurine  75 mg Oral Daily  . sodium chloride  10-40 mL Intracatheter Q12H   Infusions:     . dextrose 5 % and 0.45 % NaCl with KCl 20 mEq/L 60 mL/hr at 11/28/12 0314  . TPN  (CLINIMIX) +/- additives 55 mL/hr at 11/27/12 1735   And  . fat emulsion 250 mL (11/27/12 1735)  . [EXPIRED] TPN (CLINIMIX) +/- additives 55 mL/hr at 11/26/12 1735   PRN: acetaminophen, acetaminophen, HYDROmorphone (DILAUDID) injection, LORazepam, menthol-cetylpyridinium, ondansetron (ZOFRAN) IV, ondansetron, oxyCODONE, sodium chloride  Current Nutrition:  Full liquid diet - tolerating now MIVF = dextrose 5 % and 0.45 % NaCl with KCl 20 mEq/L infusion @ 60 ml/hr Clinimix-E 5/20 @ 55 ml/hr with lipids/MVI on MWF only (d/t Sport and exercise psychologist) (provides 66 g protein/d, 1368 KCal avg/day sun-sat)  Insulin Requirements in the past 24 hours:  Not currently on insulin. CBGs well controlled.   Nutritional Goals:  RD recommendations for estimated nutritional needs: Kcal: 1300-1400 kcals daily  Protein: 49-61 grams protein daily  Fluid: 1.3-1.4 L fluid daily  Assessment: 62 yoM with SBO, ileo-ileo fistula, began TPN 12/27. Hx Crohn's with multiple bowel resections.  KUB 12/28 -noted slight improvement in SBO.  For repeat KUB today. Note labs not checked today 12/31  Electrolytes wnl except Na slightly below LLN  LFTs wnl  TGs WNL 12/28, 111 (12/31)  Prealbumin 14.8 on 12/28, 13.1 (12/31)  SCr stable, WNL  CBGs well controlled, not requiring any insulin Tolerating TNA  at goal rate.   Plan:   Per GI note on 12/30, will begin weaning TNA since patient is tolerating full liquids - Clinimix E 5/20 down to 40 ml/hr with next bag at 1800  Lipids 20% at 10 mL/hr MWF only due to national shortage  MVI/TE on MWF only due to national shortage  Routine TNA labs Mondays and Thursdays   Await further toleration of diet and subsequent d/c of TNA   Hessie Knows, PharmD, BCPS Pager [phone] 11/28/2012 9:58 AM

## 2012-11-28 NOTE — Progress Notes (Signed)
[facility] Progress Note  Subjective:  Tolerating full liquids.  Passing multiple small stools.  Objective:  Vital signs in last 24 hours: Temp:  [97.9 F (36.6 C)-98.2 F (36.8 C)] 97.9 F (36.6 C) (12/31 1610) Pulse Rate:  [57-68] 57  (12/31 0638) Resp:  [16-18] 16  (12/31 9604) BP: (101-128)/(52-66) 101/52 mmHg (12/31 0638) SpO2:  [97 %-98 %] 98 % (12/31 5409) Last BM Date: 11/26/12 General:   Alert, Well-developed, in NAD Heart:  Regular rate and rhythm; no murmurs Pulm:  CTAB.  No W/R/R. Abdomen:  Soft, nondistended. BS present.  Some TTP diffusely.  Large ventral/incisional hernia noted; is reducible.  Mid-line laparotomy scar noted. Neurologic:  Alert and  oriented x4;  grossly normal neurologically. Psych:  Alert and cooperative. Normal mood and affect.  Intake/Output from previous day: 12/30 0701 - 12/31 0700 In: 1320 [P.O.:1320] Out: 1750 [Urine:1750]  Lab Results:  Basename 11/27/12 0630  WBC 11.3*  HGB 12.8*  HCT 37.9*  PLT 253   BMET  Basename 11/27/12 0630  NA 133*  K 3.9  CL 101  CO2 25  GLUCOSE 108*  BUN 13  CREATININE 0.87  CALCIUM 9.0   LFT  Basename 11/27/12 0630  PROT 7.0  ALBUMIN 2.9*  AST 9  ALT 6  ALKPHOS 94  BILITOT 0.5  BILIDIR --  IBILI --   Dg Abd 2 Views  11/27/2012  *RADIOLOGY REPORT*  Clinical Data: Partial small bowel obstruction.  ABDOMEN - 2 VIEW  Comparison: 11/25/2012  Findings: Upright film shows no evidence for intraperitoneal free air.  NG tube has been removed in the interval.  Supine film shows some scattered mildly distended air filled small bowel loops measuring up to 4.0 cm in diameter.  The colonic contrast seen on the previous study has been evacuated in the interval and there is a small amount of gas visible in the rectum.  IMPRESSION: No intraperitoneal free air.  Mild distention of left upper quadrant small bowel. A component of partial obstruction or focal ileus may persist but there is no  evidence on the current exam for an overt high-grade small bowel obstruction.   Original Report Authenticated By: Kennith Center, M.D.     Assessment / Plan: #1 56 yo male with Crohns disease-s/p perforated terminal ileum-ileocecectomy 04/2012, and admission for abscess 06/2012. Pt has had ongoing abdominal pain over the past 4 months with intermittent vomiting consistent with ongoing partial SBO despite and Humira. Films show maybe somewhat improved SBO and clinically he looks better as well. Tolerating full liquids. Plan is to try to get him out of the hospital without surgery and outpatient follow-up at [facility].  Will check a small bowel series with limited barium to try and identify the transition point.  **He has an ileo-ileo fistula, and multiple loops of tethered small bowel in RLQ.  #2 Nutrition- continue TPN  #3 DVT prophylaxis- on lovenox    LOS: 8 days   ZEHR, JESSICA D.  11/28/2012, 8:54 AM  Pager number [phone]    I have reviewed the above note, examined the patient and agree with plan of treatment.He  Is slightly improved, but abdomen still distended and he has  abnormal bowl sound rushes. I have spoken to the radiologist concerning trying to gain more information  As to whether he has active Crohn's or just adhesive disease.His sed rate is 46. Question of whether he would benefit from a short course of steroids to see if any benefit from the  antiinflammatory effect  [facility] Pager # [phone]

## 2012-11-29 DIAGNOSIS — Z0271 Encounter for disability determination: Secondary | ICD-10-CM

## 2012-11-29 LAB — GLUCOSE, CAPILLARY
Glucose-Capillary: 108 mg/dL — ABNORMAL HIGH (ref 70–99)
Glucose-Capillary: 94 mg/dL (ref 70–99)

## 2012-11-29 LAB — BASIC METABOLIC PANEL
CO2: 26 mEq/L (ref 19–32)
Calcium: 9.2 mg/dL (ref 8.4–10.5)
Creatinine, Ser: 0.82 mg/dL (ref 0.50–1.35)
Glucose, Bld: 93 mg/dL (ref 70–99)

## 2012-11-29 NOTE — Progress Notes (Signed)
Subjective: Feeling well.  Passing flatus.  No abdominal pain at this time.  Objective: Vital signs in last 24 hours: Temp:  [97.7 F (36.5 C)-98 F (36.7 C)] 97.7 F (36.5 C) (01/01 0605) Pulse Rate:  [51-56] 51  (01/01 0605) Resp:  [16-20] 20  (01/01 0605) BP: (95-121)/(57-64) 95/57 mmHg (01/01 0605) SpO2:  [97 %-98 %] 97 % (01/01 0605) Last BM Date: 12-06-12  Intake/Output from previous day: Dec 07, 2023 0701 - 01/01 0700 In: 2998.5 [P.O.:240; I.V.:1483; TPN:1275.5] Out: 1150 [Urine:1150] Intake/Output this shift:    General appearance: alert and no distress GI: soft, NTND, large ventral hernia  Lab Results:  Basename 11/27/12 0630  WBC 11.3*  HGB 12.8*  HCT 37.9*  PLT 253   BMET  Basename 11/29/12 0600 11/27/12 0630  NA 138 133*  K 4.0 3.9  CL 105 101  CO2 26 25  GLUCOSE 93 108*  BUN 8 13  CREATININE 0.82 0.87  CALCIUM 9.2 9.0   LFT  Basename 11/27/12 0630  PROT 7.0  ALBUMIN 2.9*  AST 9  ALT 6  ALKPHOS 94  BILITOT 0.5  BILIDIR --  IBILI --   PT/INR No results found for this basename: LABPROT:2,INR:2 in the last 72 hours Hepatitis Panel No results found for this basename: HEPBSAG,HCVAB,HEPAIGM,HEPBIGM in the last 72 hours C-Diff No results found for this basename: CDIFFTOX:3 in the last 72 hours Fecal Lactopherrin No results found for this basename: FECLLACTOFRN in the last 72 hours  Studies/Results: Dg Small Bowel  12-06-2012  *RADIOLOGY REPORT*  Clinical Data:  Crohn's disease.  Small bowel obstruction.  SMALL BOWEL SERIES  Technique:  Following ingestion of a mixture of thin barium and Entero Vu, serial small bowel images were obtained including spot views of the terminal ileum.  Fluoroscopy time:  0.53 minutes.  Comparison:  CT scan 11/20/2012.  Findings:  Small bowel transit time was 85 minutes.  There are dilated mid and distal small bowel loops with a normal mucosal fold pattern.  In the right lower quadrant there are tethered and angulated  small bowel loops suggesting adhesions.  IMPRESSION:  Probable low grade partial small bowel obstruction due to adhesions in the right lower quadrant likely related to chronic inflammation, scarring and surgery.   Original Report Authenticated By: Rudie Meyer, M.D.     Medications:  Scheduled:   . buPROPion  150 mg Oral Daily  . enoxaparin (LOVENOX) injection  40 mg Subcutaneous Q24H  . mercaptopurine  75 mg Oral Daily  . sodium chloride  10-40 mL Intracatheter Q12H   Continuous:   . dextrose 5 % and 0.45 % NaCl with KCl 20 mEq/L 60 mL/hr at Dec 06, 2012 1842  . TPN (CLINIMIX) +/- additives 40 mL/hr at 12/06/12 1746    Assessment/Plan: 1) Crohn's disease.   He appears to be progressing well at this time.  I will advance his diet to a soft, low residue diet.    Plan: 1) Continue with supportive care. 2) Advance diet.  LOS: 9 days   Crystal Scarberry D 11/29/2012, 8:37 AM

## 2012-11-29 NOTE — Progress Notes (Signed)
Patient ID: Samuel Montgomery, male   DOB: 1956/04/29, 57 y.o.   MRN: [id number] [facility] [address] [facility] Progress Note:   * No surgery found *  Subjective: Mental status is clear. Objective: Vital signs in last 24 hours: Temp:  [97.7 F (36.5 C)-98 F (36.7 C)] 97.7 F (36.5 C) (01/01 0605) Pulse Rate:  [51-56] 51  (01/01 0605) Resp:  [16-20] 20  (01/01 0605) BP: (95-121)/(57-64) 95/57 mmHg (01/01 0605) SpO2:  [97 %-98 %] 97 % (01/01 0605)  Intake/Output from previous day: 12/31 0701 - 01/01 0700 In: 2998.5 [P.O.:240; I.V.:1483; TPN:1275.5] Out: 1150 [Urine:1150] Intake/Output this shift:    Physical Exam: Work of breathing is normal.  Large broad based ventral hernia.    Lab Results:  Results for orders placed during the hospital encounter of 11/20/12 (from the past 48 hour(s))  GLUCOSE, CAPILLARY     Status: Normal   Collection Time   11/27/12  2:27 PM      Component Value Range Comment   Glucose-Capillary 89  70 - 99 mg/dL   GLUCOSE, CAPILLARY     Status: Abnormal   Collection Time   11/27/12  6:06 PM      Component Value Range Comment   Glucose-Capillary 111 (*) 70 - 99 mg/dL    Comment 1 Documented in Chart      Comment 2 Notify RN     GLUCOSE, CAPILLARY     Status: Abnormal   Collection Time   11/27/12 11:19 PM      Component Value Range Comment   Glucose-Capillary 121 (*) 70 - 99 mg/dL    Comment 1 Notify RN     SEDIMENTATION RATE     Status: Abnormal   Collection Time   11/28/12  5:00 AM      Component Value Range Comment   Sed Rate 46 (*) 0 - 16 mm/hr   GLUCOSE, CAPILLARY     Status: Abnormal   Collection Time   11/28/12  6:33 AM      Component Value Range Comment   Glucose-Capillary 109 (*) 70 - 99 mg/dL    Comment 1 Notify RN     GLUCOSE, CAPILLARY     Status: Abnormal   Collection Time   11/28/12 12:03 PM      Component Value Range Comment   Glucose-Capillary 111 (*) 70 - 99 mg/dL   GLUCOSE, CAPILLARY     Status: Normal   Collection Time   11/28/12   5:59 PM      Component Value Range Comment   Glucose-Capillary 94  70 - 99 mg/dL   GLUCOSE, CAPILLARY     Status: Abnormal   Collection Time   11/29/12  1:08 AM      Component Value Range Comment   Glucose-Capillary 106 (*) 70 - 99 mg/dL    Comment 1 Notify RN     BASIC METABOLIC PANEL     Status: Normal   Collection Time   11/29/12  6:00 AM      Component Value Range Comment   Sodium 138  135 - 145 mEq/L    Potassium 4.0  3.5 - 5.1 mEq/L    Chloride 105  96 - 112 mEq/L    CO2 26  19 - 32 mEq/L    Glucose, Bld 93  70 - 99 mg/dL    BUN 8  6 - 23 mg/dL    Creatinine, Ser 4.09  0.50 - 1.35 mg/dL    Calcium 9.2  8.4 - 10.5 mg/dL    GFR calc non Af Amer >90  >90 mL/min    GFR calc Af Amer >90  >90 mL/min   GLUCOSE, CAPILLARY     Status: Abnormal   Collection Time   11/29/12  6:05 AM      Component Value Range Comment   Glucose-Capillary 101 (*) 70 - 99 mg/dL    Comment 1 Notify RN       Radiology/Results: Dg Small Bowel  11/28/2012  *RADIOLOGY REPORT*  Clinical Data:  Crohn's disease.  Small bowel obstruction.  SMALL BOWEL SERIES  Technique:  Following ingestion of a mixture of thin barium and Entero Vu, serial small bowel images were obtained including spot views of the terminal ileum.  Fluoroscopy time:  0.53 minutes.  Comparison:  CT scan 11/20/2012.  Findings:  Small bowel transit time was 85 minutes.  There are dilated mid and distal small bowel loops with a normal mucosal fold pattern.  In the right lower quadrant there are tethered and angulated small bowel loops suggesting adhesions.  IMPRESSION:  Probable low grade partial small bowel obstruction due to adhesions in the right lower quadrant likely related to chronic inflammation, scarring and surgery.   Original Report Authenticated By: Rudie Meyer, M.D.     Anti-infectives: Anti-infectives    None      Assessment/Plan: Problem List: Patient Active Problem List  Diagnosis  . Psoriasis  . GERD (gastroesophageal reflux  disease)  . Bilateral inguinal hernia  . Personal history of colonic polyps-rectal adenoma  . Crohn's ileocolitis  . Cigarette smoker  . Calcified granuloma of lung  . Alcohol dependence  . 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 Nonspecific (abnormal) findings on radiological and other examination of gastrointestinal tract  . Small bowel obstruction    Partial SBO--is this amenable to retrograde study or dilatation?  Will defer to GI Otherwise tolerating liquids * No surgery found *    LOS: 9 days   Matt B. Daphine Deutscher, MD, [facility] [phone] beeper [phone]  11/29/2012 9:20 AM

## 2012-11-29 NOTE — Progress Notes (Signed)
PARENTERAL NUTRITION CONSULT NOTE   Pharmacy Consult for TNA Indication: SBO  No Known Allergies  Patient Measurements: Height: 5\' 4"  (162.6 cm) Weight: 135 lb 6.4 oz (61.417 kg) IBW/kg (Calculated) : 59.2   Vital Signs: Temp: 97.7 F (36.5 C) (01/01 0605) Temp src: Oral (01/01 0605) BP: 95/57 mmHg (01/01 0605) Pulse Rate: 51  (01/01 0605) Intake/Output from previous day: 12/31 0701 - 01/01 0700 In: 2998.5 [P.O.:240; I.V.:1483; TPN:1275.5] Out: 1150 [Urine:1150]  Labs:  Basename 11/27/12 0630  WBC 11.3*  HGB 12.8*  HCT 37.9*  PLT 253  APTT --  INR --     Basename 11/29/12 0600 11/27/12 0630  NA 138 133*  K 4.0 3.9  CL 105 101  CO2 26 25  GLUCOSE 93 108*  BUN 8 13  CREATININE 0.82 0.87  LABCREA -- --  CREAT24HRUR -- --  CALCIUM 9.2 9.0  MG -- 2.0  PHOS -- 3.4  PROT -- 7.0  ALBUMIN -- 2.9*  AST -- 9  ALT -- 6  ALKPHOS -- 94  BILITOT -- 0.5  BILIDIR -- --  IBILI -- --  PREALBUMIN -- 13.1*  TRIG -- 73  CHOLHDL -- --  CHOL -- 111   Estimated Creatinine Clearance: 83.2 ml/min (by C-G formula based on Cr of 0.82).    Basename 11/29/12 1133 11/29/12 0605 11/29/12 0108  GLUCAP 108* 101* 106*   Current Nutrition:  Low fiber diet - tolerating now MIVF = dextrose 5 % and 0.45 % NaCl with KCl 20 mEq/L infusion @ 60 ml/hr Clinimix-E 5/20 @ 40 ml/hr with lipids/MVI on MWF only (d/t national shortage)   Insulin Requirements in the past 24 hours:  Not currently on insulin. CBGs well controlled.   Nutritional Goals:  RD recommendations for estimated nutritional needs: Kcal: 1300-1400 kcals daily  Protein: 49-61 grams protein daily  Fluid: 1.3-1.4 L fluid daily  Assessment:  43 yoM with SBO, ileo-ileo fistula, began TPN 12/27. Hx Crohn's with multiple bowel resections.  KUB 12/28 -noted slight improvement in SBO.   Electrolytes wnl   TNA weaned to 40 ml/hr yesterday.  Diet advanced this morning and per RN, pt did very well with breakfast.  Per MD,  will not reorder another bag of TNA for tonight.   Plan:   D/C TNA when current bag expires 11/30/11 at 1800  D/C routine TNA labs  F/u further toleration of diet    Lynann Beaver PharmD, BCPS Pager [phone] 11/29/2012 11:43 AM

## 2012-11-30 LAB — GLUCOSE, CAPILLARY
Glucose-Capillary: 86 mg/dL (ref 70–99)
Glucose-Capillary: 91 mg/dL (ref 70–99)

## 2012-11-30 MED ORDER — MERCAPTOPURINE 50 MG PO TABS: 50.0000 mg | ORAL_TABLET | Freq: Every day | ORAL | Status: DC

## 2012-11-30 NOTE — Progress Notes (Signed)
Patient ID: Samuel Montgomery, male   DOB: [birth year], 57 y.o.   MRN: [id number]    Subjective: Pt feels well.  Tolerated multiple meals of low fiber and still passing stool and flatus.  Objective: Vital signs in last 24 hours: Temp:  [97.9 F (36.6 C)-98.2 F (36.8 C)] 97.9 F (36.6 C) (01/02 0615) Pulse Rate:  [53-66] 60  (01/02 0615) Resp:  [16-18] 16  (01/02 0615) BP: (106-123)/(62-66) 111/62 mmHg (01/02 0615) SpO2:  [99 %-100 %] 99 % (01/02 0615) Last BM Date: 11/29/12  Intake/Output from previous day: 01/01 0701 - 01/02 0700 In: 815 [P.O.:815] Out: 1525 [Urine:1525] Intake/Output this shift:    PE: Abd: soft, NT, hernia soft  Lab Results:  No results found for this basename: WBC:2,HGB:2,HCT:2,PLT:2 in the last 72 hours BMET  Basename 11/29/12 0600  NA 138  K 4.0  CL 105  CO2 26  GLUCOSE 93  BUN 8  CREATININE 0.82  CALCIUM 9.2   PT/INR No results found for this basename: LABPROT:2,INR:2 in the last 72 hours CMP     Component Value Date/Time   NA 138 11/29/2012 0600   K 4.0 11/29/2012 0600   CL 105 11/29/2012 0600   CO2 26 11/29/2012 0600   GLUCOSE 93 11/29/2012 0600   BUN 8 11/29/2012 0600   CREATININE 0.82 11/29/2012 0600   CREATININE 0.87 08/07/2012 2022   CALCIUM 9.2 11/29/2012 0600   PROT 7.0 11/27/2012 0630   ALBUMIN 2.9* 11/27/2012 0630   AST 9 11/27/2012 0630   ALT 6 11/27/2012 0630   ALKPHOS 94 11/27/2012 0630   BILITOT 0.5 11/27/2012 0630   GFRNONAA >90 11/29/2012 0600   GFRAA >90 11/29/2012 0600   Lipase     Component Value Date/Time   LIPASE 43 07/01/2012 2115       Studies/Results: Dg Small Bowel  12-25-2012  *RADIOLOGY REPORT*  Clinical Data:  Crohn's disease.  Small bowel obstruction.  SMALL BOWEL SERIES  Technique:  Following ingestion of a mixture of thin barium and Entero Vu, serial small bowel images were obtained including spot views of the terminal ileum.  Fluoroscopy time:  0.53 minutes.  Comparison:  CT scan 11/20/2012.  Findings:  Small bowel  transit time was 85 minutes.  There are dilated mid and distal small bowel loops with a normal mucosal fold pattern.  In the right lower quadrant there are tethered and angulated small bowel loops suggesting adhesions.  IMPRESSION:  Probable low grade partial small bowel obstruction due to adhesions in the right lower quadrant likely related to chronic inflammation, scarring and surgery.   Original Report Authenticated By: Rudie Meyer, M.D.     Anti-infectives: Anti-infectives    None       Assessment/Plan  1. PSBO 2. Crohn's disease  Plan: 1. Ok for [employer] home from our standpoint. 2. May follow up with [place] as an outpt to discuss elective hernia repair.   LOS: 10 days    Dickey Caamano E 11/30/2012, 8:46 AM Pager: [phone]

## 2012-11-30 NOTE — Discharge Summary (Signed)
[facility] Discharge Summary  Name: Samuel Montgomery MRN: [id number] DOB: 07/24/1956 57 y.o. PCP:  Lucilla Edin, MD  Date of Admission: 11/20/2012 11:38 AM Date of Discharge: 11/30/2012 Primary Gastroenterologist: Stan Head, MD Discharging Physician: Lina Sar, MD  Discharge Diagnosis: 1. 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   2. Partial SBO, resolving. Obstruction likely secondary to adhesions and / or chronic inflammation.   Consultations:[facility]   Procedures Performed:  Ct Abdomen Pelvis W Contrast  11/20/2012  *RADIOLOGY REPORT*  Clinical Data: History of Crohn disease.  Abdominal pain.  Evaluate for bowel obstruction.  CT ABDOMEN AND PELVIS WITH CONTRAST  Technique:  Multidetector CT imaging of the abdomen and pelvis was performed following the standard protocol during bolus administration of intravenous contrast.  Contrast: OMNIPAQUE IOHEXOL 300 MG/ML  SOLN  Comparison: Old priors, most recently CT of the abdomen and pelvis 09/13/2012.  Findings:  Lung Bases: Calcified granuloma in the left lower lobe.  Dependent atelectasis in the lower lobes of the lungs bilaterally.  3 mm noncalcified nodule in the periphery of the left lower lobe (image 10 of series 5) is unchanged compared to 04/11/2012, likely representing small subpleural lymph node.  Abdomen/Pelvis:  The appearance of the liver, gallbladder, pancreas, bilateral adrenal glands and bilateral kidneys is unremarkable.  Multiple small calcifications within the spleen are compatible with small calcified granulomas.  Atherosclerosis throughout the abdominal and pelvic vasculature, without definite aneurysm or dissection.  There is marked dilatation throughout the small bowel measuring up to approximately 4.7 cm in diameter and multiple air-fluid levels. This involves the entire small bowel, and appears to terminate in the region of the right lower quadrant, where  there is a large conglomerate of multiple small bowel loops and the adjacent cecum, likely reflecting a large amount of inflammatory adhesions.  In the midst of these adhesions in the right lower quadrant there is a low attenuation rim enhancing area best demonstrated on axial images 39- 42 of series 2, suspicious for an enteroenteric fistula. Postoperative changes of partial bowel resection (likely resection of the terminal ileum and a portion of the cecum) are noted.  No large volume of ascites or pneumoperitoneum is identified at this time.  No definite pathologic lymphadenopathy is identified within the abdomen or pelvis.  Prostate and urinary bladder are unremarkable in appearance.  Trace amount of free fluid in the pelvis is presumably reactive.  Musculoskeletal: There are no aggressive appearing lytic or blastic lesions noted in the visualized portions of the skeleton.  IMPRESSION: 1.  Marked dilatation of the small bowel, compatible with a small bowel obstruction.  This appears to be related to adhesions (there is a tangle of small bowel loops and adjacent cecum in the right lower quadrant). 2.  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 3.  Trace amount of free fluid in the pelvis is presumably reactive. 4.  Additional incidental findings, as above.   Original Report Authenticated By: Trudie Reed, M.D.    Dg Small Bowel  11/28/2012  *RADIOLOGY REPORT*  Clinical Data:  Crohn's disease.  Small bowel obstruction.  SMALL BOWEL SERIES  Technique:  Following ingestion of a mixture of thin barium and Entero Vu, serial small bowel images were obtained including spot views of the terminal ileum.  Fluoroscopy time:  0.53 minutes.  Comparison:  CT scan 11/20/2012.  Findings:  Small bowel transit time was 85 minutes.  There are dilated mid and distal small bowel loops with a normal  mucosal fold pattern.  In the right lower quadrant there are tethered and angulated small bowel loops suggesting adhesions.  IMPRESSION:  Probable low grade partial small bowel obstruction due to adhesions in the right lower quadrant likely related to chronic inflammation, scarring and surgery.   Original Report Authenticated By: Rudie Meyer, M.D.    Dg Abd 2 Views  11/27/2012  *RADIOLOGY REPORT*  Clinical Data: Partial small bowel obstruction.  ABDOMEN - 2 VIEW  Comparison: 11/25/2012  Findings: Upright film shows no evidence for intraperitoneal free air.  NG tube has been removed in the interval.  Supine film shows some scattered mildly distended air filled small bowel loops measuring up to 4.0 cm in diameter.  The colonic contrast seen on the previous study has been evacuated in the interval and there is a small amount of gas visible in the rectum.  IMPRESSION: No intraperitoneal free air.  Mild distention of left upper quadrant small bowel. A component of partial obstruction or focal ileus may persist but there is no evidence on the current exam for an overt high-grade small bowel obstruction.   Original Report Authenticated By: Kennith Center, M.D.    Dg Abd 2 Views  11/25/2012  *RADIOLOGY REPORT*  Clinical Data: Small bowel obstruction.  ABDOMEN - 2 VIEW  Comparison: 11/24/2012  Findings: NG tube remains in stable position within the stomach. Continued mild dilatation of upper abdominal small bowel loops with air-fluid levels.  Dilatation has improved slightly since prior study.  No free air organomegaly.  Oral contrast material is seen within the left side of the colon, similar to prior study.  Calcified granulomas in the left lower lobe and left hilum.  Right lung base clear.  Heart is normal size.  IMPRESSION: Slight improvement in small bowel obstruction pattern.   Original Report Authenticated By: Charlett Nose, M.D.    Dg Abd 2 Views  11/24/2012  *RADIOLOGY REPORT*  Clinical Data: Follow-up of  small bowel obstruction.  ABDOMEN - 2 VIEW  Comparison: 11/23/2012  Findings: NG tube tip is in the fundus of the stomach. There is persistent dilatation of small bowel loops in the mid abdomen. Contrast remains in the nondistended colon.  There are multiple tablets in the right side of the abdomen, probably ingested medication.  IMPRESSION: Findings consistent with persistent partial small bowel obstruction.   Original Report Authenticated By: Francene Boyers, M.D.    Dg Abd 2 Views  11/23/2012  *RADIOLOGY REPORT*  Clinical Data: Evaluate small bowel obstruction, abdominal pain  ABDOMEN - 2 VIEW  Comparison: 11/22/2012; 09/20/2012; CT abdomen pelvis - 11/20/2012  Findings:  Grossly unchanged moderate dilatation of a rather featureless loop of small bowel within the left mid hemiabdomen with index of measuring approximately 5.1 cm in diameter.  Enteric contrast is again seen within the transverse, descending, sigmoid colon and rectum.  No definite pneumoperitoneum, pneumatosis or portal venous gas.  An enteric tube tip and side port project over the left upper abdominal quadrant.  Radiopaque pills are unchanged in positioning overlying the right lower abdomen.  An enteric central line overlies the right lower abdomen.  Limited visualization of the lower thorax demonstrates sequela of prior granulomatous disease.  No focal airspace opacity or pleural effusion.  Unchanged bones.  IMPRESSION: Grossly unchanged findings compatible with at least a high grade partial small bowel obstruction.   Original Report Authenticated By:  Tacey Ruiz, MD    Dg Abd 2 Views  11/22/2012  *RADIOLOGY REPORT*  Clinical Data: Small bowel resection and July 2013, now with lower abdominal pain and small bowel obstruction, follow-up  ABDOMEN - 2 VIEW  Comparison: Abdomen films of 11/21/2012  Findings: There is little change in gaseous distention of small bowel loops measuring up to 5.3 cm in diameter consistent with persistent partial  small bowel obstruction.  The colon is decompressed and does contain some contrast with air noted to the rectum.  An NG tube coils in the fundus of the stomach.  No free air is seen.  IMPRESSION: Persistent small bowel obstruction.  NG tube coils in the fundus of the stomach.   Original Report Authenticated By: Dwyane Dee, M.D.    Dg Abd 2 Views  11/21/2012  *RADIOLOGY REPORT*  Clinical Data: small bowel obstruction  ABDOMEN - 2 VIEW  Comparison: CT abdomen and pelvis November 20, 2012  Findings: Supine and upright abdomen views were obtained. Nasogastric tube tip and side port in the stomach.  There remain multiple loops of dilated bowel with air-fluid levels consistent with a degree of obstruction.  Contrast does reach the colon and rectum, indicating that the small bowel obstruction is partial. No free air is seen.  IMPRESSION:  Partial small bowel obstruction with contrast reaching the colon and rectum.  Nasogastric tube tip and side port are in the stomach.  No free air.   Original Report Authenticated By: Bretta Bang, M.D.     GI Procedures: none  History/Physical Exam:   See Admission H&P  Admission HPI: Per Amy Esterwood, P.A.- C.   Samuel Montgomery is a 57 y.o. male who was diagnosed with Crohn's disease within the past year, and presented with perforation of the terminal ileum and intra-abdominal abscesses in June 2013. He underwent exploratory laparotomy with ileocecectomy and drainage of the intra-abdominal abscess on 616 per Dr. Gerrit Friends.. During that same admission he was also found to have evidence of cirrhosis, and COPD. He had a long hospital course and then required readmission in August of 2013 with a right peri- Rectal abscess, by MRI which was not symptomatic and then CT scanning at that same time showed a phlegmon containing air in the right lower abdomen.. This was managed with IV antibiotics on the and the phlegmon was felt to be likely related to a small anastomotic leak area  there was no drainable abscess. He improved with IV antibiotics.. Eventually he was started on Humira and 6-MP but has continued to have complaints of some lower abdominal pain. He underwent CT scan last in October of 2013 and this due to show persistent inflammatory process in the mesentery of the right lower quadrant with evidence of tethering of multiple loops of small bowel around the antral anastomosis, there was also a tiny collection of debris and gas within the mesentery between the loops of small bowel consistent with a previously opacified fistula is no evidence of contrast flow into the fistula on the exam. There was also mild diffuse distention of mall bowel proximal to the anterolateral colic anastomosis and some narrowing of small bowel loops in that area suspicious for inflammatory stricturing.  Patient had requested a second opinion at [facility] and is awaiting an appointment with Dr. Chesley Mires at [facility] on 12/27/2011.  He comes in today as an urgent add-on to the office with complaints of worsening abdominal pain. He says his pain is been much worse over the past  3 or 4 days. He says he really has never felt like he has gotten better since his surgery and has had some constant abdominal discomfort however at this time he has having very sharp "debilitating" pain about every 5 minutes in the area of his ventral incisional hernia. He says this is excruciating and isn't associated intermittently with vomiting which relieves his pain briefly. He has not had any documented fever or chills or sweats he has continued to have fairly normal bowel movements without blood. He says has not been eating very well over the past for 5 days and actually ran out of his 6-MP about 2 weeks ago. He has been taking the Humira to 2 weeks and is due for another injection X. week. He is moaning intermittently during the visit today and very uncomfortable. He and his mother also both expressed their restoration  with his ongoing pain and feel that he has not gotten adequate help over the past couple of months. He states that he needs to have surgery he does not want to be operated on in [place] and would like to be transferred to [facility] Course by problem list: 1. Fistulizing Crohn's disease. In June of this year patient found to have perforated terminal ileum. He underwent exploratory laparotomy / ileocecectomy and drainage of intraabdominal abscess by Dr. Gerrit Friends.  He was on extended course of antibiotics but ultimately began biologic therapy and immunomodulator for Crohn's. Patient followed by Dr. Leone Payor but was awaiting second opinion with  Dr. Steele Sizer at [facility] when he presented to our office with increased abdominal pain. CTscan on admission c/w SBO in RLQ likely from adhesions.  See #2.  2. Small bowel obstruction. NGT inserted on day of admission for decompression. Patient had no significant improvement in symptoms for several days, he required TNA for nutrition. Surgery saw patient in consultation but patient wanted to go to [facility] if surgery became necessary. Fortunately, by the fifth day of admission patient showed clinical improvement, NGT was clamped. Patient continued to show clinical improvement and his abdominal films showed improvement as well. His diet was advanced to full liquids 12/30. An UGI with SBFT revealed probable low grade partial SBO secondary to adhesions in RLQ.  Patient continued to improve, his diet was advanced to low residue  which he tolerated fine. By 11/30/12 patient was ready for discharge.   Discharge Vitals:  BP 111/62  Pulse 60  Temp 97.9 F (36.6 C) (Oral)  Resp 16  Ht 5\' 4"  (1.626 m)  Wt 135 lb 6.4 oz (61.417 kg)  BMI 23.24 kg/m2  SpO2 99%  Discharge Labs:  Results for orders placed during the hospital encounter of 11/20/12 (from the past 24 hour(s))  GLUCOSE, CAPILLARY     Status: Abnormal   Collection Time   11/29/12 11:33 AM      Component  Value Range   Glucose-Capillary 108 (*) 70 - 99 mg/dL   Comment 1 Notify RN    GLUCOSE, CAPILLARY     Status: Normal   Collection Time   11/29/12  7:10 PM      Component Value Range   Glucose-Capillary 94  70 - 99 mg/dL   Comment 1 Notify RN    GLUCOSE, CAPILLARY     Status: Normal   Collection Time   11/29/12 11:59 PM      Component Value Range   Glucose-Capillary 86  70 - 99 mg/dL   Comment 1 Notify RN    GLUCOSE, CAPILLARY  Status: Normal   Collection Time   11/30/12  6:19 AM      Component Value Range   Glucose-Capillary 91  70 - 99 mg/dL   Comment 1 Notify RN      Disposition and follow-up:   Samuel Montgomery was discharged from [facility] in stable condition.    Follow-up Appointments: Dr. Leone Payor - Feb 3rd at 10:45am  Patient sees Dr. Steele Sizer at [facility] on 12/26/12.   Discharge Medications:   Medication List     As of 11/30/2012 12:13 PM    ASK your doctor about these medications         adalimumab 40 MG/0.8ML injection   Commonly known as: HUMIRA   Inject 40 mg sq every 2 weeks      buPROPion 150 MG 24 hr tablet   Commonly known as: WELLBUTRIN XL   Take 1 tablet (150 mg total) by mouth 2 (two) times daily. Start with 1 daily for first week      glycopyrrolate 2 MG tablet   Commonly known as: ROBINUL   Take 1 tablet (2 mg total) by mouth 2 (two) times daily as needed. For abdominal cramps      LORazepam 2 MG tablet   Commonly known as: ATIVAN   Take 1 tablet (2 mg total) by mouth every 6 (six) hours as needed for anxiety.      mercaptopurine 50 MG tablet   Commonly known as: PURINETHOL   Take 1.5 tablets (75 mg total) by mouth daily. Give on an empty stomach 1 hour before or 2 hours after meals. Caution: Chemotherapy.      oxyCODONE-acetaminophen 5-325 MG per tablet   Commonly known as: PERCOCET/ROXICET   Take 1 tablet by mouth every 4 (four) hours as needed. For pain      polyethylene glycol powder powder   Commonly known as:  GLYCOLAX/MIRALAX   Take 17 g by mouth as needed.      PROBIOTIC DAILY PO   Take 1 capsule by mouth daily.         Signed: Willette Cluster 11/30/2012, 11:00 AM

## 2012-11-30 NOTE — Progress Notes (Signed)
Improved. Tolerating low residue diet. Discharge today on Humira, , appointment at [facility] in next 23 weeks.

## 2012-12-26 ENCOUNTER — Telehealth: Payer: Self-pay | Admitting: Internal Medicine

## 2012-12-26 ENCOUNTER — Encounter: Payer: Self-pay | Admitting: Internal Medicine

## 2012-12-26 NOTE — Telephone Encounter (Signed)
Error

## 2012-12-26 NOTE — Telephone Encounter (Signed)
Dr. Leone Payor do you approve of switch?

## 2012-12-26 NOTE — Telephone Encounter (Signed)
Yes

## 2012-12-27 NOTE — Telephone Encounter (Signed)
I will accept

## 2012-12-27 NOTE — Telephone Encounter (Signed)
Dr. Juanda Chance will you accept this patient?

## 2013-01-01 ENCOUNTER — Encounter: Payer: Self-pay | Admitting: Internal Medicine

## 2013-01-01 ENCOUNTER — Ambulatory Visit: Payer: BC Managed Care – PPO | Admitting: Internal Medicine

## 2013-01-01 NOTE — Telephone Encounter (Signed)
Appt schedule with pt for next available 02-14-13 9:15am.

## 2013-01-02 ENCOUNTER — Encounter: Payer: Self-pay | Admitting: Internal Medicine

## 2013-01-02 MED ORDER — OXYCODONE-ACETAMINOPHEN 5-325 MG PO TABS: 1.0000 | ORAL_TABLET | ORAL | Status: DC | PRN

## 2013-01-02 NOTE — Telephone Encounter (Signed)
Patient should be seen in the office in the next 2 weeks. He may have enough oxycodone for 2 tablets daily until his office visit.

## 2013-01-02 NOTE — Telephone Encounter (Signed)
Patient has been scheduled for 01-15-13 @ 3:45 for appointment. Patient verbalizes understanding. He is also made aware that we will have a script for oxycodone ready for him to pick up tomorrow.

## 2013-01-04 ENCOUNTER — Encounter: Payer: Self-pay | Admitting: *Deleted

## 2013-01-10 ENCOUNTER — Telehealth: Payer: Self-pay | Admitting: Internal Medicine

## 2013-01-10 NOTE — Telephone Encounter (Signed)
I have spoken to the pt. He is at [facility]. He is for OR tomorrow. He will follow up with [place] when he is released by his surgeon,

## 2013-01-10 NOTE — Telephone Encounter (Signed)
See note above

## 2013-01-15 ENCOUNTER — Ambulatory Visit: Payer: BC Managed Care – PPO | Admitting: Internal Medicine

## 2013-01-23 ENCOUNTER — Telehealth: Payer: Self-pay | Admitting: Radiology

## 2013-01-23 NOTE — Telephone Encounter (Signed)
Please call patient and let him know that we are not able to do daily IV's in our office. If he is at [facility] they need to arrange home health so he can receive this at home

## 2013-01-23 NOTE — Telephone Encounter (Signed)
I have advised [facility]. Patient not accepted by home health

## 2013-01-23 NOTE — Telephone Encounter (Signed)
Please advise. If patient can come here and have daily IV s done, patient can not get home health, they have called every home health agency in the county and can not get patient in. [phone] Samuel [address]. He had an illeostomy done there at [facility]. Has already stated one extra day in the hospital. Please advise. Amy

## 2013-02-04 IMAGING — CT CT ABD-PELV W/ CM
2 of 5 series · 17 of 46 positions shown, 19 images · IV contrast (READICAT/WATER & [ID] OMNI 300)
Comparison: 07/19/2012

CLINICAL DATA: Crohn disease.  Bowel resection 05/14/2012.
Elevated white count.  Abdominal pain.

CT ABDOMEN AND PELVIS WITH CONTRAST
TECHNIQUE: Multidetector CT imaging of the abdomen and pelvis was
performed following the standard protocol during bolus
administration of intravenous contrast.
Contrast: 100mL OMNIPAQUE IOHEXOL 300 MG/ML  SOLN

[Series 2: abd/pelvis with · axial · 0.74mm/px · z∈[-378,-18]mm · 14 of 82 slices shown, 16 images]
[im 5/82  soft-tissue]
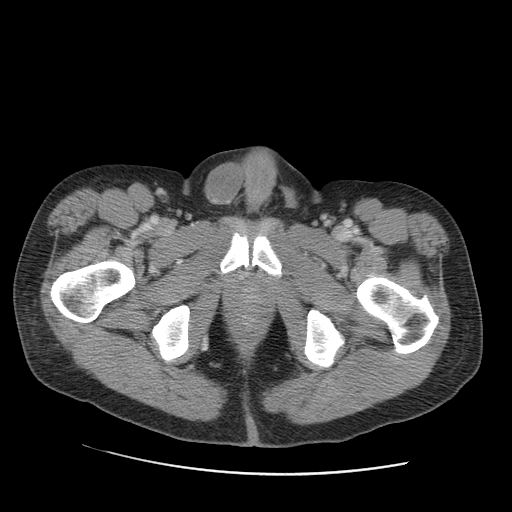
[im 5/82  bone]
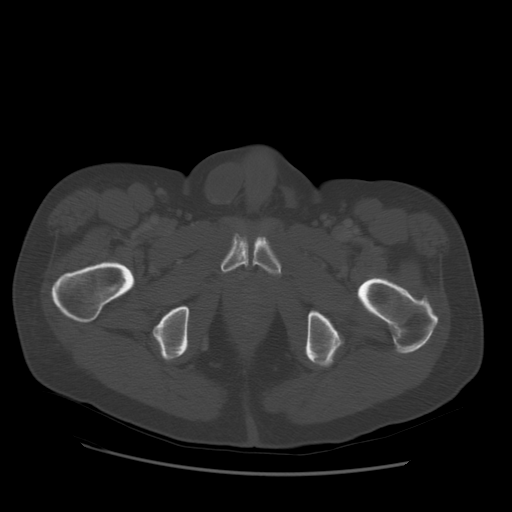
[im 10/82  soft-tissue]
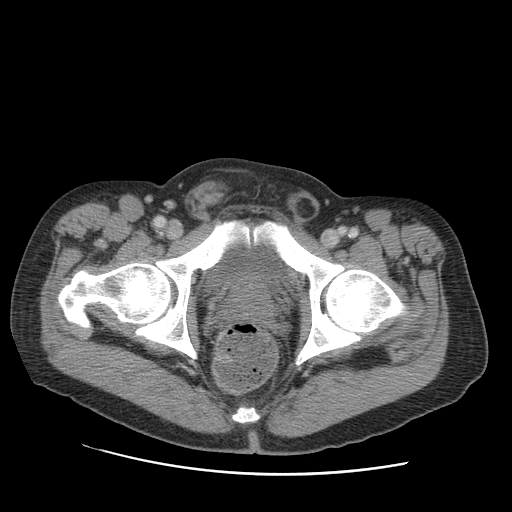
[im 19/82  soft-tissue]
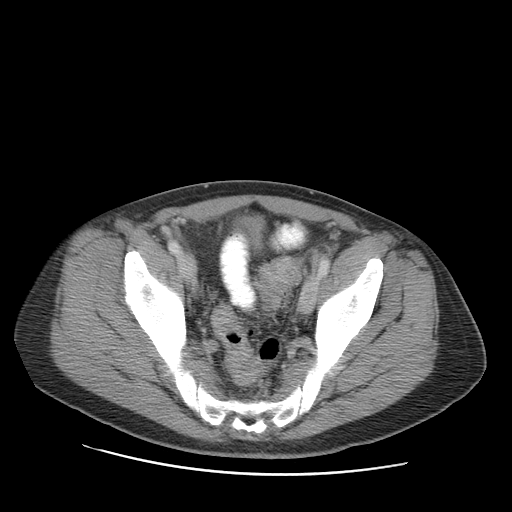
[im 23/82  soft-tissue]
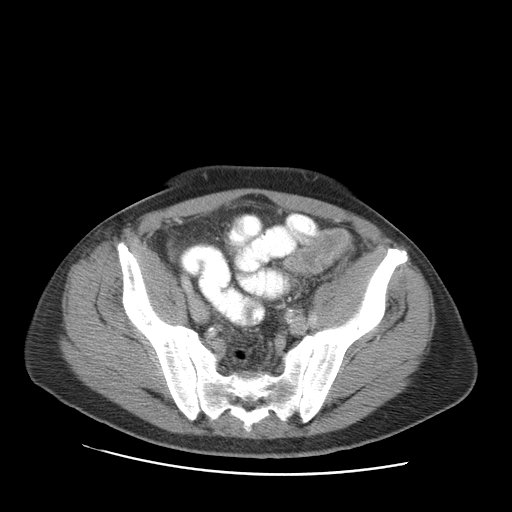
[im 28/82  soft-tissue]
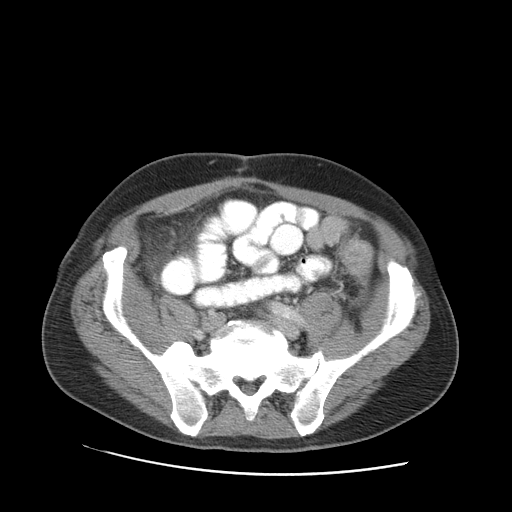
[im 32/82  soft-tissue]
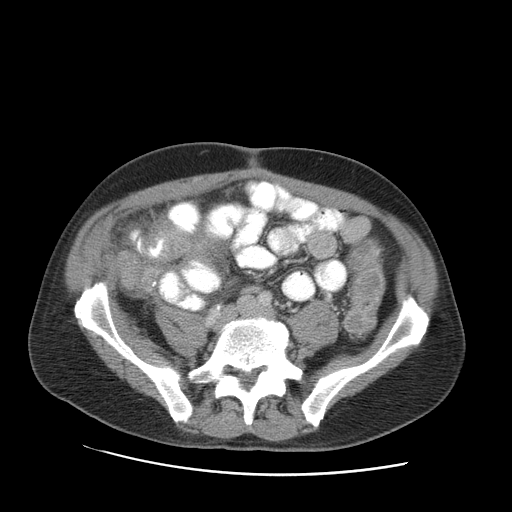
[im 37/82  soft-tissue]
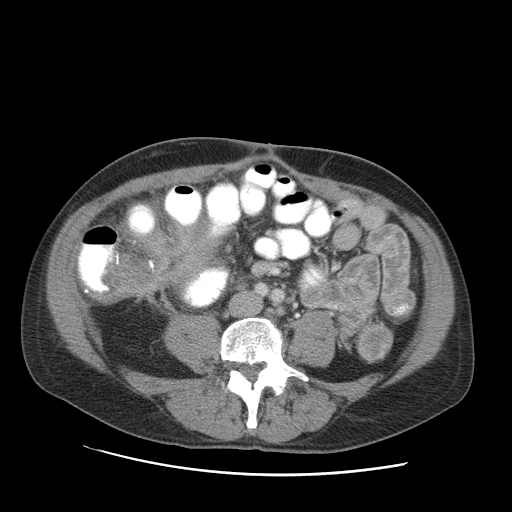
[im 46/82  soft-tissue]
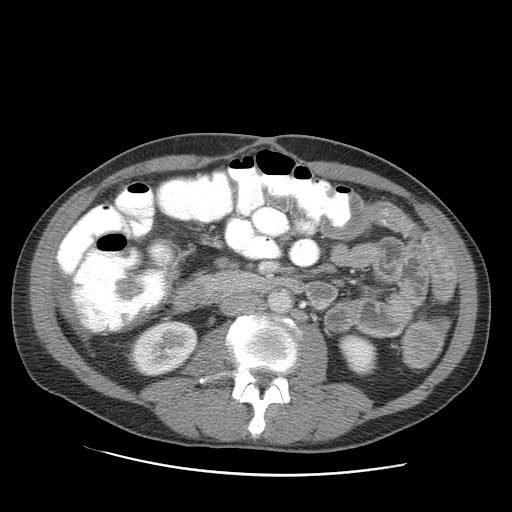
[im 50/82  soft-tissue]
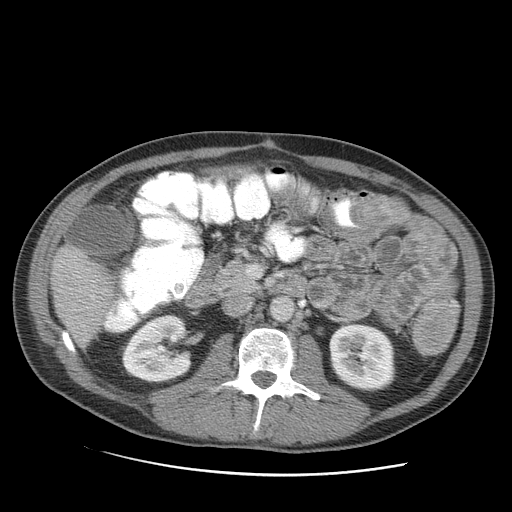
[im 50/82  bone]
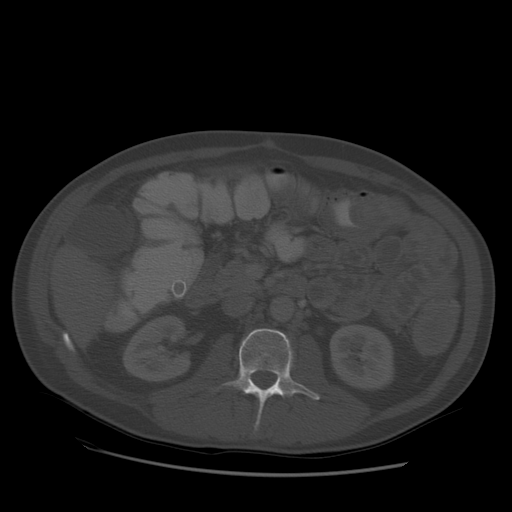
[im 55/82  soft-tissue]
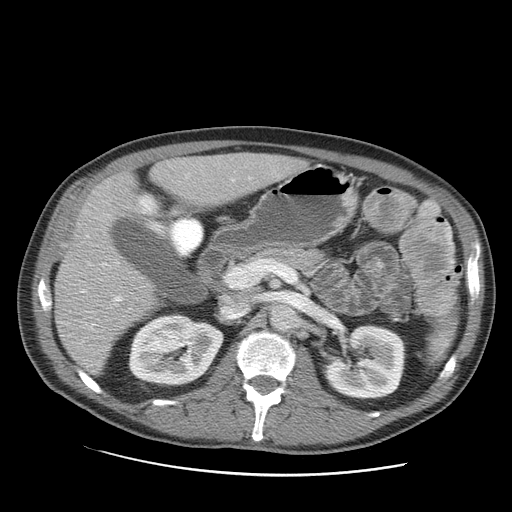
[im 59/82  soft-tissue]
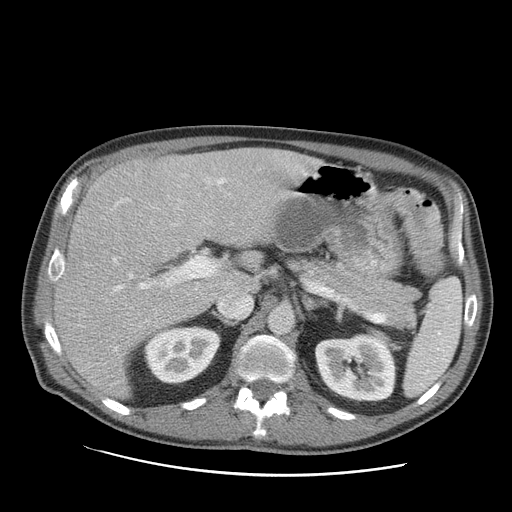
[im 64/82  soft-tissue]
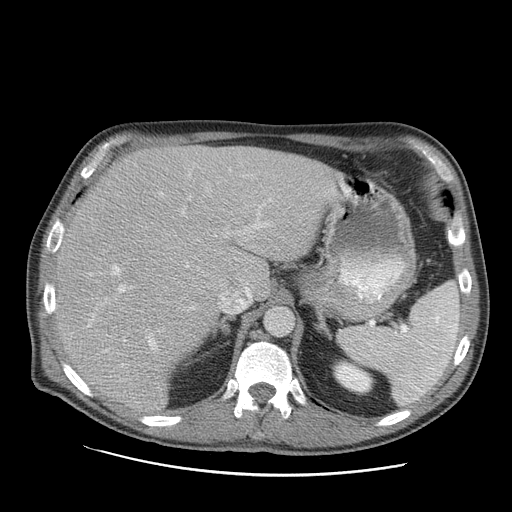
[im 73/82  soft-tissue]
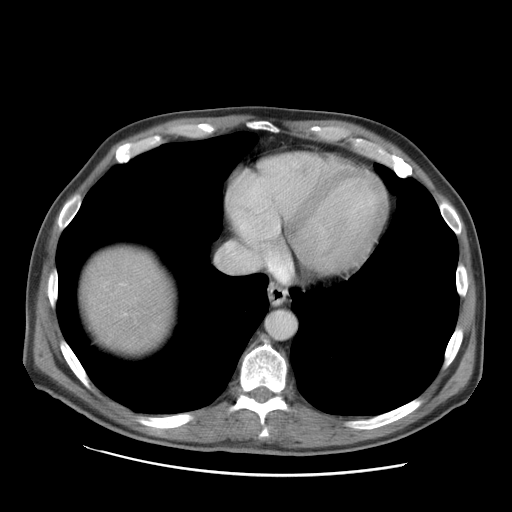
[im 77/82  soft-tissue]
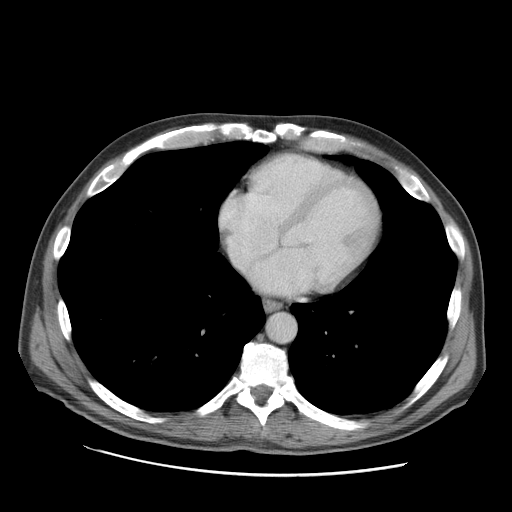

[Series 400: coronal · coronal · 0.88mm/px · 3 of 111 slices shown]
[im 37/111  soft-tissue]
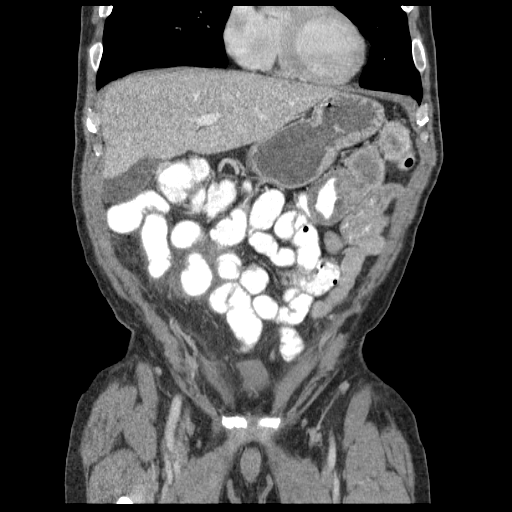
[im 49/111  soft-tissue]
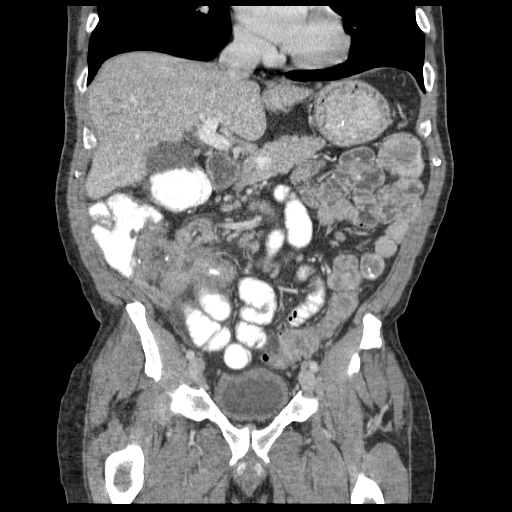
[im 62/111  soft-tissue]
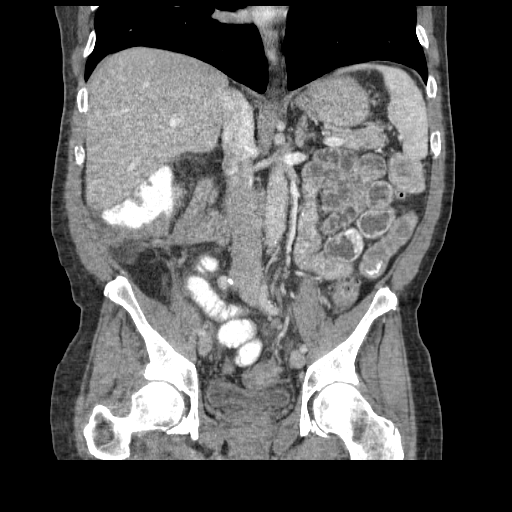

[17 of 46 positions shown; findings below may reference images not displayed]

FINDINGS: Inflammatory process continues in the right lower
quadrant in the area of the neo terminal ileum and cecum.  The
appearance is slightly worsened since prior study.  Enlarged right
lower quadrant mesenteric lymph nodes have increased.  Index node
has a short axis diameter of 16 mm compared 10 mm previously.
Secondary wall thickening within several adjacent small bowel loops
in the right lower quadrant likely slightly worsened.  I see no
contrast extravasation. Previously seen ileal-ileal fistula not
opacified with contrast currently.

Liver, spleen, pancreas, gallbladder, adrenals and kidneys are
unremarkable.  No free fluid or free air.

Bladder wall appears slightly thickened, but is decompressed.
Prostate mildly prominent.

Lung bases are clear.  No effusions.  Heart is normal size.
Calcified granuloma in the left lower lobe.

No acute bony abnormality.
IMPRESSION: Mild worsening of the inflammatory process in the right lower
quadrant.  No contrast extravasation noted.  Secondary wall
thickening within adjacent small bowel loops has increased as has
enlarged mesenteric lymph nodes.

## 2013-02-14 ENCOUNTER — Ambulatory Visit: Payer: BC Managed Care – PPO | Admitting: Internal Medicine

## 2013-02-14 ENCOUNTER — Telehealth: Payer: Self-pay | Admitting: *Deleted

## 2013-02-14 NOTE — Telephone Encounter (Signed)
I have spoken to the patient, he is still under care of the [facility]. Humiral has been stopped, so we don't need to refill it. He will likely continue his care at [facility].

## 2013-02-14 NOTE — Telephone Encounter (Signed)
Dr Juanda Chance- I have gotten a refill request from [facility] for patient to get refills on Humira every other week. However, patient apparently had recent surgery at [facility]. He has never seen you in the office (switched from Dr Leone Payor) but stated that he would come in following release from his surgeon. Do you want me to go ahead and continue to fill the Humira or do we need to get surgeon report first?

## 2013-02-15 NOTE — Telephone Encounter (Signed)
Rx denied

## 2013-03-30 ENCOUNTER — Ambulatory Visit (INDEPENDENT_AMBULATORY_CARE_PROVIDER_SITE_OTHER): Payer: BC Managed Care – PPO | Admitting: Family Medicine

## 2013-03-30 VITALS — BP 125/67 | HR 73 | Temp 99.0°F | Resp 16 | Ht 65.5 in | Wt 124.4 lb

## 2013-03-30 DIAGNOSIS — K5090 Crohn's disease, unspecified, without complications: Secondary | ICD-10-CM

## 2013-03-30 DIAGNOSIS — K612 Anorectal abscess: Secondary | ICD-10-CM

## 2013-03-30 NOTE — Progress Notes (Signed)
57 yo disabled man with Crohn's Disease who is here for his first visit after abdominal surgery in February 2014.  He is now 3 months post-ileostomy.  He is giving self IV fluids at home daily.  Mother is helping him.  He has a PIC line. Overall, pain is significantly less.  Not gaining weight.   He is taking supplements to help maintain weight.  Current meds above constitute only a partial list.  He is here today because gluteal cheek is swollen.     Objective:  NAD, energetic /Right gluteal cleft is red, swollen, fluctuant and draining  Assessment:  Fistulous abscess  Plan:  Draining material cultured. With Dr. Alene Mires at [facility] and she recommends the patient be sent to the emergency room for an MRI to better evaluate the extent of this abscess. I relayed this message the patient and emphasized the importance of followup with his surgeons and Crohn's disease specialist. He agrees to go immediately to [facility] for further evaluation and treatment  Merrily Pew MD [phone] (spoke with Alene Mires, MD) is surgeon [facility] GI surgeon Email:  [email or web address]  Signed,  Elvina Sidle, MD

## 2013-04-02 LAB — WOUND CULTURE
Gram Stain: NONE SEEN
Gram Stain: NONE SEEN

## 2013-04-11 ENCOUNTER — Other Ambulatory Visit: Payer: Self-pay | Admitting: Family Medicine

## 2013-04-11 ENCOUNTER — Encounter: Payer: Self-pay | Admitting: Family Medicine

## 2013-04-11 MED ORDER — BUPROPION HCL ER (XL) 150 MG PO TB24: 150.0000 mg | ORAL_TABLET | Freq: Two times a day (BID) | ORAL | Status: DC

## 2013-04-11 NOTE — Telephone Encounter (Signed)
Pended please advise.  

## 2013-08-21 DIAGNOSIS — Z760 Encounter for issue of repeat prescription: Secondary | ICD-10-CM

## 2013-09-30 ENCOUNTER — Ambulatory Visit (INDEPENDENT_AMBULATORY_CARE_PROVIDER_SITE_OTHER): Payer: BC Managed Care – PPO | Admitting: Family Medicine

## 2013-09-30 VITALS — BP 138/92 | HR 52 | Temp 97.7°F | Resp 16 | Ht 64.0 in | Wt 144.6 lb

## 2013-09-30 DIAGNOSIS — A419 Sepsis, unspecified organism: Secondary | ICD-10-CM

## 2013-09-30 DIAGNOSIS — K5090 Crohn's disease, unspecified, without complications: Secondary | ICD-10-CM

## 2013-09-30 DIAGNOSIS — Z23 Encounter for immunization: Secondary | ICD-10-CM

## 2013-09-30 NOTE — Progress Notes (Signed)
This chart was scribed for Elvina Sidle, MD by Caryn Bee, Medical Scribe. This patient was seen in Room/bed 01 and the patient's care was started at 8:17 AM.  Subjective:    Patient ID: Samuel Montgomery, male    DOB: 1956/08/25, 57 y.o.   MRN: [id number]  HPI HPI Comments: Samuel Montgomery is a 57 y.o. male with h/o Crohn's disease  And colostomy who presents to [facility] for f/u. Pt weighed 113 lbs in June, but has gained weight since, he is now at 146 lbs. He gives himself fluids because he dehydrates easily since his colostomy. Pt was in the hospital for Halloween and was discharged yesterday where a picc line was put into place because he was running a fever. Pt states that his pulse was low, in the 40s, while in the hospital. He reports feeling "spacey" at times and does not feel like he's getting enough blood to his brain. He will begin IV antibiotics for this. He has surgery scheduled to have his intestines repaired in January 2015. He takes imodium daily. Pt will begin taking probiotics when he finishes his antibiotics. Pt talked to a physician over the summer because he had began feeling depressed. He has improved mentally since seeing the physician. He states he had tried taking Xanax, but did not like it.  Pt denies any pain. Pt's social security and disability claim has gone through. His sister passed away just over 1 year ago, she also had h/o Crohn's disease. Pt is a smoker, but is trying to quit. He has recently started using vaporized nicotine instead of smoking cigarettes. He is requesting flu vaccination.    Review of Systems  Constitutional: Negative for fever.  Cardiovascular: Negative for chest pain.  Gastrointestinal: Negative for abdominal pain.  Musculoskeletal: Negative for arthralgias and back pain.   Past Surgical History  Procedure Laterality Date  . Inguinal hernia repair      bilateral   . Appendectomy  1979  . Vasectomy    . Colonoscopy w/ biopsies    . Laparotomy   05/14/2012    Procedure: EXPLORATORY LAPAROTOMY;  Surgeon: Velora Heckler, MD;  Location: [facility];  Service: General;  Laterality: N/A;  . Bowel resection  05/14/2012    Procedure: SMALL BOWEL RESECTION;  Surgeon: Velora Heckler, MD;  Location: [facility];  Service: General;  Laterality: N/A;  ileocecectomy  . Incise and drain abcess     History   Social History  . Marital Status: Legally Separated    Spouse Name: N/A    Number of Children: 2  . Years of Education: N/A   Occupational History  . [employer]   .     Social History Main Topics  . Smoking status: Current Some Day Smoker -- 40 years    Types: Cigarettes  . Smokeless tobacco: Former Neurosurgeon    Types: Chew  . Alcohol Use: 0.0 oz/week     Comment: 2-3 every 4-5 days   . Drug Use: Yes    Special: Marijuana     Comment: per patient uses pot monthly.  . Sexual Activity: No   Other Topics Concern  . Not on file   Social History Narrative  . No narrative on file   Past Medical History  Diagnosis Date  . GERD (gastroesophageal reflux disease)   . Psoriasis   . Adenomatous colon polyp 05/20/2005  . Diverticulosis of colon (without mention of hemorrhage)   . COPD (chronic obstructive pulmonary disease)   .  Inguinal hernia     bi-lateral  . 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 ? Perianal fistula vs. Abscess    . Adenomatous colon polyp 04/2005    colonoscopy by Dr Leone Payor.   . Small bowel obstruction   . 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 Anemia   . Depression    Family History  Problem Relation Age of Onset  . Colon polyps Sister     x 2  . Crohn's disease Sister   . Stroke Father   . Colon cancer Neg Hx   . Hyperlipidemia Mother   . GER disease Mother    No Known Allergies      Objective:   Physical Exam  Nursing note and vitals reviewed. Constitutional: He is oriented to person, place, and time. He appears well-developed and well-nourished. No distress.  HENT:  Head: Atraumatic.   Neck: Normal range of motion. Neck supple.  Cardiovascular: Normal rate, regular rhythm and normal heart sounds.  Exam reveals no gallop and no friction rub.   No murmur heard. Pulmonary/Chest: Effort normal and breath sounds normal. No respiratory distress.  Abdominal:  Colostomy  Neurological: He is alert and oriented to person, place, and time.  Skin: Skin is warm and dry. He is not diaphoretic.  Psychiatric: He has a normal mood and affect. His behavior is normal.   abdominal exam reveals protruding ileum from his ostomy site, no HSM, nontender, no masses. Patient is wearing an abdominal girdle. Patient also has a superficial ulceration in the previous midline abdominal incision. There is no erythema there.    Assessment & Plan:  There were no encounter diagnoses. Flu vaccine need - Plan: Flu Vaccine QUAD 36+ mos IM  Crohn's disease  Sepsis  Signed, Elvina Sidle, MD

## 2014-01-31 DIAGNOSIS — Z0271 Encounter for disability determination: Secondary | ICD-10-CM

## 2014-03-07 ENCOUNTER — Ambulatory Visit (INDEPENDENT_AMBULATORY_CARE_PROVIDER_SITE_OTHER): Payer: BC Managed Care – PPO | Admitting: Emergency Medicine

## 2014-03-07 ENCOUNTER — Ambulatory Visit: Payer: Self-pay | Admitting: Family Medicine

## 2014-03-07 VITALS — BP 110/58 | HR 69 | Temp 98.6°F | Resp 16 | Ht 64.0 in | Wt 151.4 lb

## 2014-03-07 DIAGNOSIS — T8141XA Infection following a procedure, superficial incisional surgical site, initial encounter: Secondary | ICD-10-CM

## 2014-03-07 DIAGNOSIS — T8140XA Infection following a procedure, unspecified, initial encounter: Secondary | ICD-10-CM

## 2014-03-07 NOTE — Progress Notes (Signed)
Subjective:    Patient ID: Samuel Montgomery, male    DOB: 26-Mar-1956, 58 y.o.   MRN: [id number]  HPI This chart was scribed for Samuel Montgomery, by Lovena Le Day, Scribe. This patient was seen in room 12 and the patient's care was started at 11:00 AM.  HPI Comments: Samuel Montgomery is a 58 y.o. male who has a h/o chron's presents to the [facility] for possible surgical infection.   Three months ago, he had reversal surgery of his ileostomy at [facility], performed by his GI Surgeon Dr. Charma Igo. Over the last several days, he has been concerned that his surgical incision has become infected. Today this incision began with purulent drainage. He denies any fever or chills. He is on Humira.   Past Medical History  Diagnosis Date  . GERD (gastroesophageal reflux disease)   . Psoriasis   . Adenomatous colon polyp 05/20/2005  . Diverticulosis of colon (without mention of hemorrhage)   . COPD (chronic obstructive pulmonary disease)   . Inguinal hernia     bi-lateral  . 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 ? Perianal fistula vs. Abscess    . Adenomatous colon polyp 04/2005    colonoscopy by Dr Carlean Purl.   . Small bowel obstruction   . 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 Anemia   . Depression     No Known Allergies  No orders of the defined types were placed in this encounter.    Review of Systems  Constitutional: Negative for fever and chills.  Respiratory: Negative for cough and shortness of breath.   Cardiovascular: Negative for chest pain.  Gastrointestinal: Negative for abdominal pain.  Musculoskeletal: Negative for back pain.  Skin: Positive for wound.      Objective:   Physical Exam  Nursing note and vitals reviewed. Constitutional: He is oriented to person, place, and time. He appears well-developed and well-nourished. No distress.  HENT:  Head: Normocephalic and atraumatic.  Eyes: Conjunctivae are normal. Right eye exhibits no  discharge. Left eye exhibits no discharge.  Neck: Normal range of motion.  Cardiovascular: Normal rate.   Pulmonary/Chest: Effort normal. No respiratory distress.  Musculoskeletal: Normal range of motion. He exhibits no edema.  Neurological: He is alert and oriented to person, place, and time.  Skin: Skin is warm and dry.  Psychiatric: He has a normal mood and affect. Thought content normal.   Triage Vitals: BP 110/58  Pulse 69  Temp(Src) 98.6 F (37 C) (Oral)  Resp 16  Ht 5\' 4"  (1.626 m)  Wt 151 lb 6.4 oz (68.675 kg)  BMI 25.98 kg/m2  SpO2 99%     Assessment & Plan:  DIAGNOSTIC STUDIES: Oxygen Saturation is 99% on room air, normal by my interpretation.    COORDINATION OF CARE: At 1050 AM Discussed treatment plan with patient which includes transfer to [facility] for care of his incision with purulent drainage. Patient agrees. Patient has a draining abscess at the site of his ileostomy reversal. This does seem to extend a good 4 cm around the drainage site. A culture was obtained a purulent material. His regular physician Dr. Charma Igo was called. He also sees a Dr. Vance Peper who is his GI doctor at [facility]. Dr. Charma Igo  was kind enough to see him in her office I personally performed the services described in this documentation, which was scribed in my presence. The recorded information has been reviewed and is accurate.

## 2014-03-10 LAB — WOUND CULTURE
GRAM STAIN: NONE SEEN
Gram Stain: NONE SEEN

## 2014-03-21 ENCOUNTER — Encounter: Payer: Self-pay | Admitting: Family Medicine

## 2014-04-01 ENCOUNTER — Ambulatory Visit (INDEPENDENT_AMBULATORY_CARE_PROVIDER_SITE_OTHER): Payer: BC Managed Care – PPO | Admitting: Family Medicine

## 2014-04-01 ENCOUNTER — Telehealth: Payer: Self-pay | Admitting: *Deleted

## 2014-04-01 ENCOUNTER — Encounter: Payer: Self-pay | Admitting: Family Medicine

## 2014-04-01 VITALS — BP 124/68 | HR 57 | Temp 98.2°F | Resp 16 | Ht 64.0 in | Wt 152.0 lb

## 2014-04-01 DIAGNOSIS — M6530 Trigger finger, unspecified finger: Secondary | ICD-10-CM

## 2014-04-01 DIAGNOSIS — K50813 Crohn's disease of both small and large intestine with fistula: Secondary | ICD-10-CM

## 2014-04-01 DIAGNOSIS — N486 Induration penis plastica: Secondary | ICD-10-CM

## 2014-04-01 DIAGNOSIS — K5080 Crohn's disease of both small and large intestine without complications: Secondary | ICD-10-CM

## 2014-04-01 DIAGNOSIS — Z0000 Encounter for general adult medical examination without abnormal findings: Secondary | ICD-10-CM

## 2014-04-01 LAB — CBC WITH DIFFERENTIAL/PLATELET
Basophils Absolute: 0 10*3/uL (ref 0.0–0.1)
Basophils Relative: 0 % (ref 0–1)
Eosinophils Absolute: 0.4 10*3/uL (ref 0.0–0.7)
Eosinophils Relative: 3 % (ref 0–5)
HCT: 42.8 % (ref 39.0–52.0)
Hemoglobin: 14.4 g/dL (ref 13.0–17.0)
Lymphocytes Relative: 19 % (ref 12–46)
Lymphs Abs: 2.5 10*3/uL (ref 0.7–4.0)
MCH: 29.4 pg (ref 26.0–34.0)
MCHC: 33.6 g/dL (ref 30.0–36.0)
MCV: 87.5 fL (ref 78.0–100.0)
Monocytes Absolute: 0.9 10*3/uL (ref 0.1–1.0)
Monocytes Relative: 7 % (ref 3–12)
Neutro Abs: 9.5 10*3/uL — ABNORMAL HIGH (ref 1.7–7.7)
Neutrophils Relative %: 71 % (ref 43–77)
Platelets: 349 10*3/uL (ref 150–400)
RBC: 4.89 MIL/uL (ref 4.22–5.81)
RDW: 14.5 % (ref 11.5–15.5)
WBC: 13.4 10*3/uL — ABNORMAL HIGH (ref 4.0–10.5)

## 2014-04-01 LAB — COMPREHENSIVE METABOLIC PANEL
ALT: 9 U/L (ref 0–53)
AST: 14 U/L (ref 0–37)
Albumin: 4.5 g/dL (ref 3.5–5.2)
Alkaline Phosphatase: 111 U/L (ref 39–117)
BUN: 15 mg/dL (ref 6–23)
CO2: 22 mEq/L (ref 19–32)
Calcium: 9.4 mg/dL (ref 8.4–10.5)
Chloride: 105 mEq/L (ref 96–112)
Creat: 1.22 mg/dL (ref 0.50–1.35)
Glucose, Bld: 79 mg/dL (ref 70–99)
Potassium: 5.1 mEq/L (ref 3.5–5.3)
Sodium: 137 mEq/L (ref 135–145)
Total Bilirubin: 0.5 mg/dL (ref 0.2–1.2)
Total Protein: 8 g/dL (ref 6.0–8.3)

## 2014-04-01 LAB — POCT URINALYSIS DIPSTICK
Bilirubin, UA: NEGATIVE
Blood, UA: NEGATIVE
Glucose, UA: NEGATIVE
Ketones, UA: NEGATIVE
Leukocytes, UA: NEGATIVE
Nitrite, UA: NEGATIVE
Protein, UA: NEGATIVE
Spec Grav, UA: 1.025
Urobilinogen, UA: 0.2
pH, UA: 5

## 2014-04-01 LAB — LIPID PANEL
Cholesterol: 143 mg/dL (ref 0–200)
HDL: 77 mg/dL (ref >39)
LDL Cholesterol: 49 mg/dL (ref 0–99)
Total CHOL/HDL Ratio: 1.9 Ratio
Triglycerides: 87 mg/dL (ref <150)
VLDL: 17 mg/dL (ref 0–40)

## 2014-04-01 LAB — TSH: TSH: 0.338 u[IU]/mL — ABNORMAL LOW (ref 0.350–4.500)

## 2014-04-01 LAB — VITAMIN B12: Vitamin B-12: 701 pg/mL (ref 211–911)

## 2014-04-01 NOTE — Telephone Encounter (Signed)
Faxed form to [facility] that Dr Joseph Art completed for patient. Confirmation page received at 6:18 pm.

## 2014-04-01 NOTE — Progress Notes (Signed)
Patient ID: LEMAN Montgomery, male   DOB: [birth year], 58 y.o.   MRN: [id number]   This chart was scribed for [name], MD by Marcha Dutton, ED Scribe. This patient was seen in room 29 and the patient's care was started at 10:35 AM.  Patient ID: Samuel Montgomery MRN: [id number], DOB: 29-Sep-1956 58 y.o. Date of Encounter: 04/01/2014, 10:35 AM  Primary Physician: Jenny Reichmann, MD  Chief Complaint: Physical (CPE)   HPI: 58 y.o. y/o male with history of crohn's disease and numerous surgeries noted below here for CPE.   He reports his abdomen feels like it's working okay. He does report some cramping especially with stress. He states he feels some "tightness." He states he had numerous hernias as a result of his surgeries and is wearing a brace around his abdomen. He states he does have a small opening on the RLQ of the abdomen and believes it may be a fistula. He states he also has a fistula near his rectum. He states he's been using humira to keep other fistulas from forming. 40 Humira every other week. Pt states he has been gaining weight and muscle since his surgeries. He reports at his lowest point he was 113 lbs. Pt states he is not in pain like he was. He reports he's had a wonderful experience being at [facility] and has a positive outlook on his progress and health.    He states he has been having relatively normal stools and he's been taking a medication from [facility] to manage the texture. He reports he's still trying to find the balance. He notes lettuce is too difficult to pass for him but foods like meat and pasta don't bother him. Pt believes he will never go back to work. He states his surgeons have all okayed him to go back to work, but he's afraid it may be too much for his healing abdomen and his pain. 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   Pt reports when he gets an erection his penis is bending with some sharp pain. He was told the catheter could have caused  this pain and shaping. Pt would like a referral to a urology specialist, Ron Rosana Hoes  Pt reports he has some trigger finger in his right thumb. He states his thumb IP joint is in severe pain with movement, and he is concerned about the possibility of the Humira causing it. Pt states his thumb is the only reason he's had to use his oxycodone. He states he had a dog bite to the same hand as well in January. Pt would like a referral to orthopedic specialist, Dr. Roseanne Kaufman.  Pt reports he's getting B12 injection once a month. Pt states his son is doing well. He just got his MBA in [place] and would like to work with mental health facilities. Pt reports he grew up in [place] and graduated from high school there.    Review of Systems: weight gain,  Consitutional: No fever, chills, fatigue, night sweats, lymphadenopathy,  Eyes: No visual changes, eye redness, or discharge. ENT/Mouth: Ears: No otalgia, tinnitus, hearing loss, discharge. Nose: No congestion, rhinorrhea, sinus pain, or epistaxis. Throat: No sore throat, post nasal drip, or teeth pain. Cardiovascular: No CP, palpitations, diaphoresis, DOE, edema, orthopnea, PND. Respiratory: No cough, hemoptysis, SOB, or wheezing. Gastrointestinal: No anorexia, dysphagia, reflux, pain, nausea, vomiting, hematemesis, diarrhea, constipation, BRBPR, or melena. Genitourinary: No dysuria, frequency, urgency, hematuria, incontinence, nocturia, decreased urinary  stream, discharge, impotence, or testicular pain/masses. Musculoskeletal: No decreased ROM, myalgias, stiffness, joint swelling, or weakness. Skin: No rash, erythema, lesion changes, pain, warmth, jaundice, or pruritis. Neurological: No headache, dizziness, syncope, seizures, tremors, memory loss, coordination problems, or paresthesias. Psychological: No anxiety, depression, hallucinations, SI/HI. Endocrine: No fatigue, polydipsia, polyphagia, polyuria, or known diabetes. All other  systems were reviewed and are otherwise negative.   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 ? Perianal fistula vs. Abscess     Adenomatous colon polyp 04/2005    colonoscopy by Dr Carlean Purl.    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   Anemia    Depression      Past Surgical History  Procedure Laterality Date   Inguinal hernia repair      bilateral    Appendectomy  1979   Vasectomy     Colonoscopy w/ biopsies     Laparotomy  05/14/2012    Procedure: EXPLORATORY LAPAROTOMY;  Surgeon: Earnstine Regal, MD;  Location: [facility];  Service: General;  Laterality: N/A;   Bowel resection  05/14/2012    Procedure: SMALL BOWEL RESECTION;  Surgeon: Earnstine Regal, MD;  Location: [facility];  Service: General;  Laterality: N/A;  ileocecectomy   Incise and drain abcess     Iliostomy      Home Meds:  Prior to Admission medications   Medication Sig Start Date End Date Taking? Authorizing Provider  adalimumab (HUMIRA PEN) 40 MG/0.8ML injection Inject 40 mg sq every 2 weeks 08/22/12  Yes Gatha Mayer, MD  pantoprazole (PROTONIX) 20 MG tablet Take 40 mg by mouth daily. Pt is unsure of correct dosage amount   Yes Historical Provider, MD  buPROPion (WELLBUTRIN XL) 150 MG 24 hr tablet Take 1 tablet (150 mg total) by mouth 2 (two) times daily. Start with 1 daily for first week 04/11/13   [name], MD  dextrose 5 % SOLN 50 mL with cefTRIAXone 1 G SOLR 1 g Inject 1 g into the vein daily.    Historical Provider, MD  diphenoxylate-atropine (LOMOTIL) 2.5-0.025 MG per tablet Take 2 tablets by mouth 4 (four) times daily as needed for diarrhea or loose stools.    Historical Provider,  MD  glycopyrrolate (ROBINUL) 2 MG tablet Take 1 tablet (2 mg total) by mouth 2 (two) times daily as needed. For abdominal cramps 09/15/12   Gatha Mayer, MD  lactated ringers Inject 2,000 mLs into the vein.    Historical Provider, MD  Loperamide HCl (IMODIUM A-D PO) Take 1 tablet by mouth 4 (four) times daily.    Historical Provider, MD  LORazepam (ATIVAN) 2 MG tablet Take 1 tablet (2 mg total) by mouth every 6 (six) hours as needed for anxiety. 11/07/12   Gatha Mayer, MD  mercaptopurine (PURINETHOL) 50 MG tablet Take 1 tablet (50 mg total) by mouth daily. Give on an empty stomach 1 hour before or 2 hours after meals. Caution: Chemotherapy. 12/01/12   Willia Craze, NP  oxycodone (OXY-IR) 5 MG capsule Take 5 mg by mouth as needed.    Historical Provider, MD  oxyCODONE-acetaminophen (PERCOCET/ROXICET) 5-325 MG per tablet Take 1 tablet by  mouth every 4 (four) hours as needed. For pain 01/02/13   Lafayette Dragon, MD  polyethylene glycol powder (GLYCOLAX/MIRALAX) powder Take 17 g by mouth as needed. 10/25/12   Gatha Mayer, MD  Probiotic Product (PROBIOTIC DAILY PO) Take 1 capsule by mouth daily.    Historical Provider, MD    Allergies: No Known Allergies   History   Social History   Marital Status: Legally Separated    Spouse Name: N/A    Number of Children: 2   Years of Education: N/A   Occupational History   [employer]        Social History Main Topics   Smoking status: Current Some Day Smoker -- 40 years    Types: Cigarettes   Smokeless tobacco: Former Systems developer    Types: Chew   Alcohol Use: 0.0 oz/week     Comment: 2-3 every 4-5 days    Drug Use: Yes    Special: Marijuana     Comment: per patient uses pot monthly.   Sexual Activity: No   Other Topics Concern   Not on file   Social History Narrative   No narrative on file    Family History  Problem Relation Age of Onset   Colon polyps Sister     x 2   Crohn's disease Sister    Stroke Father    Colon  cancer Neg Hx    Hyperlipidemia Mother    GER disease Mother     Physical Exam: Blood pressure 124/68, pulse 57, temperature 98.2 F (36.8 C), temperature source Oral, resp. rate 16, height 5\' 4"  (1.626 m), weight 152 lb (68.947 kg), SpO2 99.00%. Body mass index is 26.08 kg/(m^2). General: Well developed, well nourished, in no acute distress. HEENT: Normocephalic, atraumatic. Conjunctiva pink, sclera non-icteric. Pupils 2 mm constricting to 1 mm, round, regular, and equally reactive to light and accomodation. EOMI. Internal auditory canal clear. TMs with good cone of light and without pathology. Nasal mucosa pink. Nares are without discharge. No sinus tenderness. Oral mucosa pink. Dentition normal. Pharynx without exudate.   Neck: Supple. Trachea midline. No thyromegaly. Full ROM. No lymphadenopathy. Lungs: Clear to auscultation bilaterally without wheezes, rales, or rhonchi. Breathing is of normal effort and unlabored. Cardiovascular: RRR with S1 S2. No murmurs, rubs, or gallops appreciated. Distal pulses 2+ symmetrically. No carotid or abdominal bruits Abdomen: Soft, diffusely tender with a small fistula on the right midabdomen, non-distended with normoactive bowel sounds. No hepatosplenomegaly or masses. No rebound/guarding. No CVA tenderness. Bilateral inguinal hernias.   Patient does have a small fistula in the right rectus abdominis area as well as a right intergluteal fistula. Genitourinary:  circumcised male. No penile lesions. Testes descended bilaterally, and smooth without tenderness or masses.  Musculoskeletal: Full range of motion and 5/5 strength throughout. Without swelling, atrophy, tenderness, crepitus, or warmth. Extremities without clubbing, cyanosis, or edema. Calves supple. Swollen PCP joints and a trigger finger with swelling of the DIP joint of the right thumb. Skin: Warm and moist without erythema, ecchymosis, wounds, or rash. Neuro: A+Ox3. CN II-XII grossly intact. Moves  all extremities spontaneously. Full sensation throughout. Normal gait. DTR 2+ throughout upper and lower extremities. Finger to nose intact. Psych:  Responds to questions appropriately with a normal affect.    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  Signed, [name], MD 04/01/2014 10:35 AM

## 2014-04-02 LAB — VITAMIN D 25 HYDROXY (VIT D DEFICIENCY, FRACTURES): Vit D, 25-Hydroxy: 34 ng/mL (ref 30–89)

## 2014-04-15 ENCOUNTER — Other Ambulatory Visit: Payer: Self-pay | Admitting: Family Medicine

## 2014-04-15 DIAGNOSIS — N486 Induration penis plastica: Secondary | ICD-10-CM | POA: Insufficient documentation

## 2014-04-15 NOTE — Telephone Encounter (Signed)
Dr L, you just saw pt, but I'm not sure if we have ever Rxd for pt. In EPIC it looks like IP Rxs were for 40 mg QD. Do you want to RX?

## 2014-05-21 ENCOUNTER — Encounter: Payer: Self-pay | Admitting: Family Medicine

## 2014-05-21 DIAGNOSIS — N486 Induration penis plastica: Secondary | ICD-10-CM

## 2014-07-23 ENCOUNTER — Encounter: Payer: Self-pay | Admitting: Family Medicine

## 2014-07-31 ENCOUNTER — Encounter: Payer: Self-pay | Admitting: Family Medicine

## 2014-07-31 DIAGNOSIS — K50819 Crohn's disease of both small and large intestine with unspecified complications: Secondary | ICD-10-CM

## 2014-07-31 NOTE — Assessment & Plan Note (Signed)
[facility] Dr. Sharen Hint [place] 07/09/14 Crohns disease:  He will adjust his Questran dose.  i will plan a colonoscopy later this year to assess for endoscopic activity.  We discussed the importance of quitting smoking.  If he has progressive fecal incontinence and no significant activity on colonoscopy we discussed the possibility of anorectal manometry and pelvic floor physical therapy.   Abdominal Wall Drainage:  This is not clearly a fistula by the recent imaging.  It is currently being packed.  i will leave further management regarding local treatment and the possibility of infected mesh to the surgery team .

## 2014-10-23 ENCOUNTER — Ambulatory Visit: Payer: BC Managed Care – PPO

## 2014-10-23 ENCOUNTER — Ambulatory Visit (INDEPENDENT_AMBULATORY_CARE_PROVIDER_SITE_OTHER): Payer: BC Managed Care – PPO

## 2014-10-23 DIAGNOSIS — Z23 Encounter for immunization: Secondary | ICD-10-CM

## 2014-11-06 ENCOUNTER — Telehealth: Payer: Self-pay

## 2014-11-06 NOTE — Telephone Encounter (Signed)
Eveyln from [facility] called to get [employer] auth for patients colonoscopy 11/11/14. I called her back LMOM to ask for treating dr name and npi with diagnosis code to submit to [facility]. Waiting on her call. Please complete for patient he is having colonscopy.   Samuel Montgomery direct number: [phone]

## 2014-11-06 NOTE — Telephone Encounter (Signed)
Spoke to [employer] is pending please call her when it is [place].   Estill Bamberg [phone]

## 2014-11-07 NOTE — Telephone Encounter (Signed)
Called evelyn at [phone] to let her know authroization was [id number]

## 2014-11-14 ENCOUNTER — Encounter: Payer: Self-pay | Admitting: Cardiology

## 2014-11-15 ENCOUNTER — Ambulatory Visit (INDEPENDENT_AMBULATORY_CARE_PROVIDER_SITE_OTHER): Payer: Commercial Managed Care - HMO | Admitting: Family Medicine

## 2014-11-15 VITALS — BP 132/70 | HR 71 | Temp 98.3°F | Resp 18 | Ht 64.0 in | Wt 159.0 lb

## 2014-11-15 DIAGNOSIS — H109 Unspecified conjunctivitis: Secondary | ICD-10-CM

## 2014-11-15 DIAGNOSIS — K50813 Crohn's disease of both small and large intestine with fistula: Secondary | ICD-10-CM

## 2014-11-15 MED ORDER — TOBRAMYCIN 0.3 % OP SOLN: 1.0000 [drp] | Freq: Four times a day (QID) | OPHTHALMIC | Status: DC

## 2014-11-15 NOTE — Progress Notes (Signed)
° °  Subjective:    Patient ID: Samuel Montgomery, male    DOB: 1956/08/24, 58 y.o.   MRN: [id number] This chart was scribed for [name], MD by Zola Button, Medical Scribe. This patient was seen in Room 14 and the patient's care was started at 11:25 AM.   HPI HPI Comments: Samuel Montgomery is a 58 y.o. male with a hx of Crohn's ileocolitis and perianal fistula who presents to the [facility] complaining of his eye discharge that began 1 week ago. He states it feels like his eyes feel glued shut upon waking up. Patient states it feels like there is something outside his eyes.  Patient also brings in some forms that he wants me to fill out. He has an upcoming colonoscopy next month. Patient is incontinent.  He was getting trigger finger on his right thumb and recently had surgery for it.  Patient walks his dog every day for exercise. He has not been to the gym for exercise.  Review of Systems  Eyes: Positive for discharge.       Objective:   Physical Exam CONSTITUTIONAL: Well developed/well nourished HEAD: Normocephalic/atraumatic EYES: EOM/PERRL, injected left eye diffusely without perilimbal predominance ENMT: Mucous membranes moist NECK: supple no meningeal signs SPINE: entire spine nontender CV: S1/S2 noted, no murmurs/rubs/gallops noted LUNGS: Lungs are clear to auscultation bilaterally, no apparent distress ABDOMEN: soft, nontender, no rebound or guarding GU: no cva tenderness NEURO: Pt is awake/alert, moves all extremitiesx4 EXTREMITIES: pulses normal, full ROM SKIN: warm, color normal PSYCH: no abnormalities of mood noted   Forms for disability insurance left, partially filled out by me     Assessment & Plan:   This chart was scribed in my presence and reviewed by me personally.    ICD-9-CM ICD-10-CM   1. Conjunctivitis of left eye 372.30 H10.9 tobramycin (TOBREX) 0.3 % ophthalmic solution  2. Crohn's disease of both small and large intestine with  fistula 555.2 K50.813    This man is incontinent of feces, having to change his underwear several times a day.  He has a perianal fistula and anterior right lower quadrant fistula that drain periodically.  There is no way he can work.  Signed, [name], MD

## 2014-11-15 NOTE — Patient Instructions (Signed)

## 2014-11-18 ENCOUNTER — Encounter: Payer: Self-pay | Admitting: Family Medicine

## 2014-11-18 ENCOUNTER — Ambulatory Visit (INDEPENDENT_AMBULATORY_CARE_PROVIDER_SITE_OTHER): Payer: Commercial Managed Care - HMO | Admitting: Family Medicine

## 2014-11-18 VITALS — BP 136/68 | HR 65 | Temp 98.3°F | Resp 16 | Ht 64.0 in | Wt 158.0 lb

## 2014-11-18 DIAGNOSIS — R159 Full incontinence of feces: Secondary | ICD-10-CM

## 2014-11-18 DIAGNOSIS — IMO0001 Reserved for inherently not codable concepts without codable children: Secondary | ICD-10-CM

## 2014-11-18 DIAGNOSIS — K50113 Crohn's disease of large intestine with fistula: Secondary | ICD-10-CM

## 2014-11-18 NOTE — Patient Instructions (Signed)
Plan:  Humira to be prescribed by Dr. Renee Harder in [place] I will monitor the B12, absorption of nutrient parameters I will be responsible for med refills including pain meds, routine Crohn meds including questran, and other health issues   [name], MD

## 2014-11-18 NOTE — Progress Notes (Signed)
58 yo man with Crohn's disease, suffering from past fistulas, graft infection, ileostomy with symptoms beginning May 05, 2012.  Surgery was first conducted at [facility], but revisions were undertaken at [facility] where he has been followed for abdominal wall infections and reanatamosis over the last 2 and 1/2 years.  Patient needs disability forms completed.  We worked on that for 1/2 hour tonight.  I am scanning the entire recent medical report from [facility] into the computer having reviewed it (30+ pages)   Plan:  Humira($4000/month) to be prescribed by Dr. Renee Harder in [place].  Patient is applying for financial assistance. I will monitor the B12, absorption of nutrient parameters I will be responsible for med refills including pain meds, routine Crohn meds including questran, and other health issues   [name], MD

## 2014-11-20 ENCOUNTER — Other Ambulatory Visit: Payer: Self-pay | Admitting: Family Medicine

## 2014-12-28 ENCOUNTER — Ambulatory Visit (INDEPENDENT_AMBULATORY_CARE_PROVIDER_SITE_OTHER): Payer: Commercial Managed Care - HMO | Admitting: Family Medicine

## 2014-12-28 VITALS — BP 110/66 | HR 63 | Temp 98.0°F | Ht 64.0 in | Wt 156.8 lb

## 2014-12-28 DIAGNOSIS — R05 Cough: Secondary | ICD-10-CM

## 2014-12-28 DIAGNOSIS — R059 Cough, unspecified: Secondary | ICD-10-CM

## 2014-12-28 DIAGNOSIS — Z79899 Other long term (current) drug therapy: Secondary | ICD-10-CM

## 2014-12-28 DIAGNOSIS — Z720 Tobacco use: Secondary | ICD-10-CM

## 2014-12-28 DIAGNOSIS — F17210 Nicotine dependence, cigarettes, uncomplicated: Secondary | ICD-10-CM

## 2014-12-28 DIAGNOSIS — J069 Acute upper respiratory infection, unspecified: Secondary | ICD-10-CM

## 2014-12-28 DIAGNOSIS — Z7960 Long term (current) use of unspecified immunomodulators and immunosuppressants: Secondary | ICD-10-CM | POA: Insufficient documentation

## 2014-12-28 MED ORDER — HYDROCODONE-HOMATROPINE 5-1.5 MG/5ML PO SYRP: 5.0000 mL | ORAL_SOLUTION | ORAL | Status: DC | PRN

## 2014-12-28 MED ORDER — AMOXICILLIN-POT CLAVULANATE 875-125 MG PO TABS: 1.0000 | ORAL_TABLET | Freq: Two times a day (BID) | ORAL | Status: DC

## 2014-12-28 MED ORDER — FLUTICASONE PROPIONATE 50 MCG/ACT NA SUSP: 2.0000 | Freq: Every day | NASAL | Status: DC

## 2014-12-28 NOTE — Patient Instructions (Signed)
Drink plenty of fluids and get enough rest  I strongly advise you to think seriously about quitting smoking, pick a quit date, and follow through with getting rid of the cigarettes.  Take the Augmentin 1 twice daily for 10 days  Use the fluticasone nose spray 2 sprays each nostril twice daily for 3 days, then continue with using them once daily as written on the instructions.  Take the cough syrup 1 teaspoon every 4-6 hours if needed for bad cough only. May cause drowsiness. Advise against drinking alcohol when taking the cough syrup.  Return if worse. Because you are a tobacco user and on immunosuppressant medication, if the cough continues to persist you should be rechecked and have a chest x-ray at that time. I do not believe the chest x-ray is necessary today.

## 2014-12-28 NOTE — Progress Notes (Signed)
Subjective: 59 year old man is been having a cough for couple weeks. It comes off and on. At times productive, at times not. He has had some head congestion. He is continuing to smoke about one pack cigarettes a day. He continues drinking about 4 drinks per day. He has a history of Crohn's disease, and is on immunosuppressant therapy with Humira. He is starting to lose sleep. When he lays down his head feels up.  Objective: Pleasant gentleman alert and oriented. TMs normal. Throat mildly erythematous. Supple without significant nodes. No major tenderness of the sinuses. Chest clear to auscultation. Heart regular without murmurs.  Assessment: Cough Tobacco use disorder Immunosuppressant therapy Upper respiratory infection  Plan: With a cough going on for 2 weeks and someone who is immunosuppressant think it is appropriate to go ahead and treat him. If he is not doing better or symptoms continue to persist we should do a follow-up visit with a chest x-ray. Will treat first.

## 2015-03-05 ENCOUNTER — Encounter: Payer: Self-pay | Admitting: Family Medicine

## 2015-03-09 ENCOUNTER — Telehealth: Payer: Self-pay

## 2015-03-09 NOTE — Telephone Encounter (Signed)
Pt sent Dr. Carlean Jews a mychart message 03/05/15 regarding his tx with his GI doctor- he wants to know if Dr. Carlean Jews thinks he should cancel his next appt with them. He is also requesting a refill B-12 shots and protonix.

## 2015-03-10 MED ORDER — VITAMIN B-12 1000 MCG PO TABS: 1000.0000 ug | ORAL_TABLET | Freq: Every day | ORAL | Status: DC

## 2015-03-10 MED ORDER — PANTOPRAZOLE SODIUM 40 MG PO TBEC: 40.0000 mg | DELAYED_RELEASE_TABLET | Freq: Every day | ORAL | Status: DC

## 2015-03-10 NOTE — Telephone Encounter (Signed)
Dr Georgiana Shore see my chart message and advise.

## 2015-03-10 NOTE — Telephone Encounter (Signed)
Patient is calling again about his previous messages for Dr. Carlean Jews. Patient is really looking for a return call in the next few hours about this. He sent an email to Dr. Carlean Jews on 03/05/2015 and he called yesterday. He also states that his appointment with a specialist is tomorrow and he needs to know something. Please advise.  **This message is going in as high priority because he has an appointment tomorrow with a specialist and has tried to contact Dr. Carlean Jews since 03/05/2015.   [phone]

## 2015-03-10 NOTE — Telephone Encounter (Signed)
I think patient should see his gastroenterologist twice yearly.  We can manage the meds otherwise and I have refiled the B12 and Protonix

## 2015-03-10 NOTE — Telephone Encounter (Signed)
Pt.notified

## 2015-03-10 NOTE — Telephone Encounter (Signed)
Dr L-- Please advise!

## 2015-08-11 ENCOUNTER — Ambulatory Visit (INDEPENDENT_AMBULATORY_CARE_PROVIDER_SITE_OTHER): Payer: Commercial Managed Care - HMO | Admitting: Family Medicine

## 2015-08-11 VITALS — BP 126/76 | HR 59 | Temp 97.9°F | Resp 16 | Ht 64.0 in | Wt 151.0 lb

## 2015-08-11 DIAGNOSIS — J449 Chronic obstructive pulmonary disease, unspecified: Secondary | ICD-10-CM | POA: Diagnosis not present

## 2015-08-11 DIAGNOSIS — IMO0001 Reserved for inherently not codable concepts without codable children: Secondary | ICD-10-CM

## 2015-08-11 DIAGNOSIS — K432 Incisional hernia without obstruction or gangrene: Secondary | ICD-10-CM | POA: Diagnosis not present

## 2015-08-11 DIAGNOSIS — L84 Corns and callosities: Secondary | ICD-10-CM | POA: Diagnosis not present

## 2015-08-11 DIAGNOSIS — K50913 Crohn's disease, unspecified, with fistula: Secondary | ICD-10-CM | POA: Diagnosis not present

## 2015-08-11 MED ORDER — OXYCODONE-ACETAMINOPHEN 5-325 MG PO TABS: 1.0000 | ORAL_TABLET | ORAL | Status: AC | PRN

## 2015-08-11 MED ORDER — FLUTICASONE-SALMETEROL 100-50 MCG/DOSE IN AEPB: 1.0000 | INHALATION_SPRAY | Freq: Two times a day (BID) | RESPIRATORY_TRACT | Status: AC

## 2015-08-11 NOTE — Progress Notes (Signed)
This chart was scribed for [name], MD by Moises Blood, medical scribe at [facility].The patient was seen in exam room 1 and the patient's care was started at 5:42 PM.  Patient ID: Samuel Montgomery MRN: [id number], DOB: May 04, 1956, 59 y.o. Date of Encounter: 08/11/2015  Primary Physician: Jenny Reichmann, MD  Chief Complaint:  Chief Complaint  Patient presents with   Follow-up    Discuss humira   Callouses    On right foot, X 1 year   Hernia    Wants documentation of five pound lifting limit for insurance   Discuss Medications    Wants refill of oxycodone   Cough    Ever since he started smoking    HPI:  Samuel Montgomery is a 59 y.o. male who has h/o Crohn's Disease (diagnosed 2013) presents to [facility] for follow up.  He went to GI on Sept 1st, and follow up here. He used to take B-12 tablets, but has stopped now.   He's concerned with Humira having some skin cancer issues. He has some spots on his arms, and behind his ears. He still has Crohn's but he's not having pains. He still has fistula. He's been keeping his appetite up and keeping his weight up. He passes bowel movement very often, and describes it being very watery.   He also has a callus on his right big toe for about a year now. He's been using salicylic acid for the area and has been able to peel the skin off to clean the area.   He wants refill on his oxycodone. He uses them sparingly and only when needed. He can't take aspirin anymore.   He wants documentation of 5 lbs lifting limit for hernia for insurance. He exercises and walks daily.   He's had chronic coughs ever since he started smoking. He denies having an inhaler.   He currently lives in a RV.   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 ? Perianal fistula vs. Abscess     Adenomatous colon polyp 04/2005    colonoscopy by Dr Carlean Purl.    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 Authorizing Provider  adalimumab (HUMIRA PEN) 40 MG/0.8ML injection Inject 40 mg sq every 2 weeks 08/22/12   Gatha Mayer, MD  amoxicillin-clavulanate (AUGMENTIN) 875-125 MG per tablet Take 1 tablet by mouth 2 (two) times daily. 12/28/14   Posey Boyer, MD  buPROPion (WELLBUTRIN XL) 150 MG 24 hr tablet Take 1 tablet (150 mg total) by mouth 2 (two) times daily. Start with 1 daily for first week Patient not taking: Reported on 12/28/2014 04/11/13   [name], MD  cholestyramine Lucrezia Starch) 4 GM/DOSE powder Take by mouth 3 (three) times daily with meals.    Historical Provider, MD  dextrose 5 % SOLN 50 mL with cefTRIAXone 1 G SOLR 1 g Inject 1 g into the vein daily.  Historical Provider, MD  diphenoxylate-atropine (LOMOTIL) 2.5-0.025 MG per tablet Take 2 tablets by mouth 4 (four) times daily as needed for diarrhea or loose stools.    Historical Provider, MD  fluticasone (FLONASE) 50 MCG/ACT nasal spray Place 2 sprays into both nostrils daily. 12/28/14   Posey Boyer, MD  glycopyrrolate (ROBINUL) 2 MG tablet Take 1 tablet (2 mg total) by mouth 2 (two) times daily as needed. For abdominal cramps Patient not taking: Reported on 12/28/2014 09/15/12   Gatha Mayer, MD  HYDROcodone-homatropine [facility]) 5-1.5 MG/5ML syrup Take 5 mLs by mouth every 4 (four) hours as needed. 12/28/14   Posey Boyer, MD  lactated ringers Inject 2,000 mLs into the vein.    Historical Provider, MD  Loperamide HCl (IMODIUM A-D PO) Take 1 tablet by mouth 4 (four) times daily.     Historical Provider, MD  LORazepam (ATIVAN) 2 MG tablet Take 1 tablet (2 mg total) by mouth every 6 (six) hours as needed for anxiety. 11/07/12   Gatha Mayer, MD  mercaptopurine (PURINETHOL) 50 MG tablet Take 1 tablet (50 mg total) by mouth daily. Give on an empty stomach 1 hour before or 2 hours after meals. Caution: Chemotherapy. 12/01/12   Willia Craze, NP  oxyCODONE-acetaminophen (PERCOCET/ROXICET) 5-325 MG per tablet Take 1 tablet by mouth every 4 (four) hours as needed. For pain 01/02/13   Lafayette Dragon, MD  pantoprazole (PROTONIX) 40 MG tablet Take 1 tablet (40 mg total) by mouth daily. 03/10/15   [name], MD  polyethylene glycol powder (GLYCOLAX/MIRALAX) powder Take 17 g by mouth as needed. 10/25/12   Gatha Mayer, MD  tobramycin (TOBREX) 0.3 % ophthalmic solution Place 1 drop into the left eye every 6 (six) hours. 11/15/14   [name], MD  vitamin B-12 (CYANOCOBALAMIN) 1000 MCG tablet Take 1 tablet (1,000 mcg total) by mouth daily. 03/10/15   [name], MD    Allergies: No Known Allergies  Social History   Social History   Marital Status: Legally Separated    Spouse Name: N/A   Number of Children: 2   Years of Education: N/A   Occupational History   [employer]        Social History Main Topics   Smoking status: Current Some Day Smoker -- 40 years    Types: Cigarettes   Smokeless tobacco: Former Systems developer    Types: Chew   Alcohol Use: 0.0 oz/week     Comment: 2-3 every 4-5 days    Drug Use: Yes    Special: Marijuana     Comment: per patient uses pot monthly.   Sexual Activity: No   Other Topics Concern   Not on file   Social History Narrative   Divorced; Lives alone    2 children.   Disabled.     Review of Systems: Constitutional: negative for chills, fever, night sweats, weight changes, or fatigue  HEENT: negative for vision changes, hearing loss, congestion, rhinorrhea, ST, epistaxis, or sinus pressure Cardiovascular: negative for  chest pain or palpitations Respiratory: negative for hemoptysis,  shortness of breath; positive for cough which is productive and ongoing. Patient continues to smoke Abdominal: negative for nausea, vomiting, diarrhea, or constipation; positive for abdominal pain (lower)  Dermatological: positive for callus (right big toe)  Neurologic: negative for headache, dizziness, or syncope All other systems reviewed and are otherwise negative with the exception to those above and in the HPI.  Physical Exam: Blood pressure 126/76, pulse 59, temperature 97.9  F (36.6 C), temperature source Oral, resp. rate 16, height 5\' 4"  (1.626 m), weight 151 lb (68.493 kg), SpO2 99 %., Body mass index is 25.91 kg/(m^2). General: Well developed, well nourished, in no acute distress. Head: Normocephalic, atraumatic, eyes without discharge, sclera non-icteric, nares are without discharge. Bilateral auditory canals clear, TM's are without perforation, pearly grey and translucent with reflective cone of light bilaterally. Oral cavity moist, posterior pharynx without exudate, erythema, peritonsillar abscess, or post nasal drip.  Neck: Supple. No thyromegaly. Full ROM. No lymphadenopathy. Lungs: Bilateral rhonchi Heart: RRR with S1 S2. No murmurs, rubs, or gallops appreciated. Abdomen: Soft, with ventral hernia in the right upper quadrant, multiple surgical scars making for diffuse induration. There is no significant tenderness however. Msk:  Strength and tone normal for age. Extremities/Skin: Warm and dry. No clubbing or cyanosis. No edema. No rashes or suspicious lesions. Patient has a corn on the medial side of his right great toe. He has some scattered senile keratoses  Neuro: Alert and oriented X 3. Moves all extremities spontaneously. Gait is normal. CNII-XII grossly in tact. Psych:  Responds to questions appropriately with a normal affect.    ASSESSMENT AND PLAN:  59 y.o. year old male with ongoing Crohn's disease which  is smoldering at the present time without acute exacerbation, chronic obstructive pulmonary disease, Peyronie's disease, scattered seborrheic keratoses, corn on right great toe. He's managing his life fairly well living out of a camper and seeing people that he cares about. His children, Aaron Edelman and Steffanie Dunn, are doing well.  Patient is unable to perform any work on a regular basis because of his unpredictable flares with Crohn's disease, ongoing hernia in the ventral area which limits him to lifting 5 pounds or less, and his chronic pain requiring intermittent narcotic medicine. He also has COPD and he knows he needs to quit smoking.  This chart was scribed in my presence and reviewed by me personally.    ICD-9-CM ICD-10-CM   1. Crohn disease, with fistula 555.9 K50.913 oxyCODONE-acetaminophen (PERCOCET/ROXICET) 5-325 MG per tablet   569.81    2. COPD bronchitis 491.20 J44.9 Fluticasone-Salmeterol (ADVAIR) 100-50 MCG/DOSE AEPB  3. Corn of foot 700 L84   4. Recurrent ventral hernia 553.21 K43.2      Signed, [name], MD    Signed, [name], MD 08/11/2015 5:42 PM

## 2015-08-11 NOTE — Patient Instructions (Signed)
Crohn Disease  Crohn disease is a long-term (chronic) soreness and redness (inflammation) of the intestines (bowel). It can affect any portion of the digestive tract, from the mouth to the anus. It can also cause problems outside the digestive tract. Crohn disease is closely related to a disease called ulcerative colitis (together, these two diseases are called inflammatory bowel disease).   CAUSES   The cause of Crohn disease is not known. One theory is that, in an easily affected person, the immune system is triggered to attack the body's own digestive tissue. Crohn disease runs in families. It seems to be more common in certain geographic areas and amongst certain races. There are no clear-cut dietary causes.   SYMPTOMS   Crohn disease can cause many different symptoms since it can affect many different parts of the body. Symptoms include:  · Fatigue.  · Weight loss.  · Chronic diarrhea, sometime bloody.  · Abdominal pain and cramps.  · Fever.  · Ulcers or canker sores in the mouth or rectum.  · Anemia (low red blood cells).  · Arthritis, skin problems, and eye problems may occur.  Complications of Crohn disease can include:  · Series of holes (perforation) of the bowel.  · Portions of the intestines sticking to each other (adhesions).  · Obstruction of the bowel.  · Fistula formation, typically in the rectal area but also in other areas. A fistula is an opening between the bowels and the outside, or between the bowels and another organ.  · A painful crack in the mucous membrane of the anus (rectal fissure).  DIAGNOSIS   Your caregiver may suspect Crohn disease based on your symptoms and an exam. Blood tests may confirm that there is a problem. You may be asked to submit a stool specimen for examination. X-rays and CT scans may be necessary. Ultimately, the diagnosis is usually made after a procedure that uses a flexible tube that is inserted via your mouth or your anus. This is done under sedation and is called  either an upper endoscopy or colonoscopy. With these tests, the specialist can take tiny tissue samples and remove them from the inside of the bowel (biopsy). Examination of this biopsy tissue under a microscope can reveal Crohn disease as the cause of your symptoms.  Due to the many different forms that Crohn disease can take, symptoms may be present for several years before a diagnosis is made.  TREATMENT   Medications are often used to decrease inflammation and control the immune system. These include medicines related to aspirin, steroid medications, and newer and stronger medications to slow down the immune system. Some medications may be used as suppositories or enemas. A number of other medications are used or have been studied. Your caregiver will make specific recommendations.  HOME CARE INSTRUCTIONS   · Symptoms such as diarrhea can be controlled with medications. Avoid foods that have a laxative effect such as fresh fruit, vegetables, and dairy products. During flare-ups, you can rest your bowel by refraining from solid foods. Drink clear liquids frequently during the day. (Electrolyte or rehydrating fluids are best. Your caregiver can help you with suggestions.) Drink often to prevent loss of body fluids (dehydration). When diarrhea has cleared, eat small meals and more frequently. Avoid food additives and stimulants such as caffeine (coffee, tea, or chocolate). Enzyme supplements may help if you develop intolerance to a sugar in dairy products (lactose). Ask your caregiver or dietitian about specific dietary instructions.  · Try to   maintain a positive attitude. Learn relaxation techniques such as self-hypnosis, mental imaging, and muscle relaxation.  · If possible, avoid stresses which can aggravate your condition.  · Exercise regularly.  · Follow your diet.  · Always get plenty of rest.  SEEK MEDICAL CARE IF:   · Your symptoms fail to improve after a week or two of new treatment.  · You experience  continued weight loss.  · You have ongoing cramps or loose bowels.  · You develop a new skin rash, skin sores, or eye problems.  SEEK IMMEDIATE MEDICAL CARE IF:   · You have worsening of your symptoms or develop new symptoms.  · You have a fever.  · You develop bloody diarrhea.  · You develop severe abdominal pain.  MAKE SURE YOU:   · Understand these instructions.  · Will watch your condition.  · Will get help right away if you are not doing well or get worse.  Document Released: 08/25/2005 Document Revised: 04/01/2014 Document Reviewed: 07/24/2007  ExitCare® Patient Information ©2015 [employer]. This information is not intended to replace advice given to you by your health care provider. Make sure you discuss any questions you have with your health care provider.

## 2015-09-02 ENCOUNTER — Encounter: Payer: Self-pay | Admitting: Emergency Medicine

## 2015-09-13 ENCOUNTER — Ambulatory Visit (INDEPENDENT_AMBULATORY_CARE_PROVIDER_SITE_OTHER): Payer: Commercial Managed Care - HMO | Admitting: Family Medicine

## 2015-09-13 VITALS — BP 112/64 | HR 64 | Temp 98.1°F | Resp 18 | Ht 64.0 in | Wt 151.2 lb

## 2015-09-13 DIAGNOSIS — J449 Chronic obstructive pulmonary disease, unspecified: Secondary | ICD-10-CM

## 2015-09-13 DIAGNOSIS — Z23 Encounter for immunization: Secondary | ICD-10-CM | POA: Diagnosis not present

## 2015-09-13 DIAGNOSIS — K50913 Crohn's disease, unspecified, with fistula: Secondary | ICD-10-CM | POA: Diagnosis not present

## 2015-09-13 DIAGNOSIS — K219 Gastro-esophageal reflux disease without esophagitis: Secondary | ICD-10-CM | POA: Diagnosis not present

## 2015-09-13 DIAGNOSIS — IMO0001 Reserved for inherently not codable concepts without codable children: Secondary | ICD-10-CM

## 2015-09-13 DIAGNOSIS — K432 Incisional hernia without obstruction or gangrene: Secondary | ICD-10-CM | POA: Diagnosis not present

## 2015-09-13 NOTE — Progress Notes (Signed)
@[facility]FCLO[facility] @  This chart was scribed for [name], MD by Thea Alken, ED Scribe. This patient was seen in room 1 and the patient's care was started at 2:44 PM.  Patient ID: Samuel Montgomery MRN: [id number], DOB: March 19, 1956, 59 y.o. Date of Encounter: 09/13/2015, 2:44 PM  Primary Physician: Jenny Reichmann, MD  Chief Complaint:  Chief Complaint  Patient presents with  . Follow-up    brought in paper work for disability  . Immunizations    flu vaccine   HPI: 59 y.o. year old male with history below presents with paper work fir disability. Pt has hx of Chron's disease with fistula, diagnosed in 04/2012. He had surgery 05/10/2012. Since surgery, he has not been back to work. Pt reports he can only lift 5lbs, stress worsens symptoms and that he "leaks" with lifting. He states his last surgery was 04/2014.   Pt states life has been good, he recently returned from [place].  Pt would also like a flu shot.   Past Medical History  Diagnosis Date  . GERD (gastroesophageal reflux disease)   . Psoriasis   . Adenomatous colon polyp 05/20/2005  . Diverticulosis of colon (without mention of hemorrhage)   . COPD (chronic obstructive pulmonary disease) ([place])   . Inguinal hernia     bi-lateral  . Crohn's ileocolitis ([place]) 04/05/2012    Diagnosed by colonoscopy 03/2012. ? Perianal fistula vs. Abscess    . Adenomatous colon polyp 04/2005    colonoscopy by Dr Carlean Purl.   . Small bowel obstruction ([place])   . 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 Anemia   . Depression   . Arthritis   . Anxiety      Home Meds: Prior to Admission medications   Medication Sig Start Date End Date Taking? Authorizing Provider  adalimumab (HUMIRA PEN) 40 MG/0.8ML injection Inject 40 mg sq every 2 weeks 08/22/12  Yes Gatha Mayer, MD  Cyanocobalamin (VITAMIN B-12 IJ) Inject as directed.   Yes Historical Provider, MD  Fluticasone-Salmeterol (ADVAIR) 100-50 MCG/DOSE AEPB Inhale 1 puff into the lungs 2 (two) times daily. 08/11/15   Yes [name], MD  oxyCODONE-acetaminophen (PERCOCET/ROXICET) 5-325 MG per tablet Take 1 tablet by mouth every 4 (four) hours as needed. For pain 08/11/15  Yes [name], MD  HYDROcodone-homatropine [facility]) 5-1.5 MG/5ML syrup Take 5 mLs by mouth every 4 (four) hours as needed. Patient not taking: Reported on 08/11/2015 12/28/14   Posey Boyer, MD    Allergies: No Known Allergies  Social History   Social History  . Marital Status: Legally Separated    Spouse Name: N/A  . Number of Children: 2  . Years of Education: N/A   Occupational History  . [employer]   .     Social History Main Topics  . Smoking status: Current Some Day Smoker -- 40 years    Types: Cigarettes  . Smokeless tobacco: Former Systems developer    Types: Chew  . Alcohol Use: 0.0 oz/week     Comment: 2-3 every 4-5 days   . Drug Use: Yes    Special: Marijuana     Comment: per patient uses pot monthly.  . Sexual Activity: No   Other Topics Concern  . Not on file   Social History Narrative   Divorced; Lives alone    2 children.   Disabled.     Review of Systems: Constitutional: negative for chills, fever, night sweats, weight changes, or fatigue  HEENT: negative for vision changes, hearing loss, congestion,  rhinorrhea, ST, epistaxis, or sinus pressure Cardiovascular: negative for chest pain or palpitations Respiratory: negative for hemoptysis, wheezing, shortness of breath, or cough Abdominal: negative for abdominal pain, nausea, vomiting, diarrhea, or constipation Dermatological: negative for rash Neurologic: negative for headache, dizziness, or syncope All other systems reviewed and are otherwise negative with the exception to those above and in the HPI.   Physical Exam: Blood pressure 112/64, pulse 64, temperature 98.1 F (36.7 C), temperature source Oral, resp. rate 18, height 5\' 4"  (1.626 m), weight 151 lb 3.2 oz (68.584 kg), SpO2 98 %., Body mass index is 25.94 kg/(m^2). General: Well developed,  well nourished, in no acute distress. Head: Normocephalic, atraumatic, eyes without discharge, sclera non-icteric, nares are without discharge. Bilateral auditory canals clear, TM's are without perforation, pearly grey and translucent with reflective cone of light bilaterally. Oral cavity moist, posterior pharynx without exudate, erythema, peritonsillar abscess, or post nasal drip.  Neck: Supple. No thyromegaly. Full ROM. No lymphadenopathy. Lungs: Clear bilaterally to auscultation without wheezes, rales, or rhonchi. Breathing is unlabored. Heart: RRR with S1 S2. No murmurs, rubs, or gallops appreciated. Abdomen: Soft, non-tender, non-distended with normoactive bowel sounds. Msk:  Strength and tone normal for age. Extremities/Skin: Warm and dry. No clubbing or cyanosis. No edema. No rashes or suspicious lesions. Neuro: Alert and oriented X 3. Moves all extremities spontaneously. Gait is normal. CNII-XII grossly in tact. Psych:  Responds to questions appropriately with a normal affect.     ASSESSMENT AND PLAN:  59 y.o. year old male with Crohn's disease with fistula.  I spent 20 minutes filling out forms for disability. By signing my name below, I, Raven Small, attest that this documentation has been prepared under the direction and in the presence of [name], MD.  Electronically Signed: Thea Alken, ED Scribe. 09/13/2015. 3:02 PM.  Signed, [name], MD 09/13/2015 2:44 PM

## 2016-07-12 ENCOUNTER — Ambulatory Visit (INDEPENDENT_AMBULATORY_CARE_PROVIDER_SITE_OTHER): Payer: Commercial Managed Care - HMO | Admitting: Family Medicine

## 2016-07-12 ENCOUNTER — Encounter: Payer: Self-pay | Admitting: Family Medicine

## 2016-07-12 VITALS — BP 120/74 | HR 82 | Temp 98.6°F | Resp 16 | Ht 64.0 in | Wt 155.6 lb

## 2016-07-12 DIAGNOSIS — Z0000 Encounter for general adult medical examination without abnormal findings: Secondary | ICD-10-CM | POA: Diagnosis not present

## 2016-07-12 DIAGNOSIS — R21 Rash and other nonspecific skin eruption: Secondary | ICD-10-CM

## 2016-07-12 DIAGNOSIS — K50913 Crohn's disease, unspecified, with fistula: Secondary | ICD-10-CM | POA: Diagnosis not present

## 2016-07-12 DIAGNOSIS — Z131 Encounter for screening for diabetes mellitus: Secondary | ICD-10-CM

## 2016-07-12 DIAGNOSIS — R194 Change in bowel habit: Secondary | ICD-10-CM

## 2016-07-12 DIAGNOSIS — R198 Other specified symptoms and signs involving the digestive system and abdomen: Secondary | ICD-10-CM

## 2016-07-12 DIAGNOSIS — R140 Abdominal distension (gaseous): Secondary | ICD-10-CM | POA: Diagnosis not present

## 2016-07-12 DIAGNOSIS — J449 Chronic obstructive pulmonary disease, unspecified: Secondary | ICD-10-CM

## 2016-07-12 DIAGNOSIS — L290 Pruritus ani: Secondary | ICD-10-CM

## 2016-07-12 DIAGNOSIS — Z13220 Encounter for screening for lipoid disorders: Secondary | ICD-10-CM

## 2016-07-12 DIAGNOSIS — Z23 Encounter for immunization: Secondary | ICD-10-CM

## 2016-07-12 DIAGNOSIS — Z125 Encounter for screening for malignant neoplasm of prostate: Secondary | ICD-10-CM | POA: Diagnosis not present

## 2016-07-12 LAB — PSA: PSA: 1.4 ng/mL (ref <=4.0)

## 2016-07-12 LAB — LIPID PANEL
CHOL/HDL RATIO: 2.7 ratio (ref <=5.0)
Cholesterol: 162 mg/dL (ref 125–200)
HDL: 60 mg/dL (ref >=40)
LDL CALC: 70 mg/dL (ref <130)
Triglycerides: 160 mg/dL — ABNORMAL HIGH (ref <150)
VLDL: 32 mg/dL — AB (ref <30)

## 2016-07-12 LAB — CBC
HCT: 50.1 % — ABNORMAL HIGH (ref 38.5–50.0)
Hemoglobin: 17 g/dL (ref 13.2–17.1)
MCH: 32.8 pg (ref 27.0–33.0)
MCHC: 33.9 g/dL (ref 32.0–36.0)
MCV: 96.5 fL (ref 80.0–100.0)
MPV: 10.1 fL (ref 7.5–12.5)
PLATELETS: 251 10*3/uL (ref 140–400)
RBC: 5.19 MIL/uL (ref 4.20–5.80)
RDW: 13.8 % (ref 11.0–15.0)
WBC: 10.2 10*3/uL (ref 3.8–10.8)

## 2016-07-12 LAB — COMPLETE METABOLIC PANEL WITH GFR
ALBUMIN: 4.4 g/dL (ref 3.6–5.1)
ALK PHOS: 102 U/L (ref 40–115)
ALT: 21 U/L (ref 9–46)
AST: 19 U/L (ref 10–35)
BUN: 12 mg/dL (ref 7–25)
CHLORIDE: 107 mmol/L (ref 98–110)
CO2: 24 mmol/L (ref 20–31)
Calcium: 9.8 mg/dL (ref 8.6–10.3)
Creat: 1.36 mg/dL — ABNORMAL HIGH (ref 0.70–1.25)
GFR, Est African American: 64 mL/min (ref >=60)
GFR, Est Non African American: 56 mL/min — ABNORMAL LOW (ref >=60)
GLUCOSE: 80 mg/dL (ref 65–99)
POTASSIUM: 4.5 mmol/L (ref 3.5–5.3)
SODIUM: 138 mmol/L (ref 135–146)
Total Bilirubin: 0.7 mg/dL (ref 0.2–1.2)
Total Protein: 7.6 g/dL (ref 6.1–8.1)

## 2016-07-12 MED ORDER — HYDROCORTISONE 1 % EX LOTN: 1.0000 "application " | TOPICAL_LOTION | Freq: Two times a day (BID) | CUTANEOUS | 0 refills | Status: DC

## 2016-07-12 MED ORDER — HYDROCORTISONE 1 % EX CREA: TOPICAL_CREAM | Freq: Two times a day (BID) | CUTANEOUS | Status: DC

## 2016-07-12 MED ORDER — HYDROCORTISONE 1 % EX CREA: 1.0000 "application " | TOPICAL_CREAM | Freq: Two times a day (BID) | CUTANEOUS | 0 refills | Status: AC

## 2016-07-12 MED ORDER — ZOSTER VACCINE LIVE 19400 UNT/0.65ML ~~LOC~~ SUSR: 0.6500 mL | Freq: Once | SUBCUTANEOUS | 0 refills | Status: AC

## 2016-07-12 NOTE — Patient Instructions (Addendum)
IF you received an x-ray today, you will receive an invoice from [facility]. Please contact [facility] at [phone] with questions or concerns regarding your invoice.   IF you received labwork today, you will receive an invoice from [employer]. Please contact [facility] at [phone] with questions or concerns regarding your invoice.   Our billing staff will not be able to assist you with questions regarding bills from these companies.  You will be contacted with the lab results as soon as they are available. The fastest way to get your results is to activate your My Chart account. Instructions are located on the last page of this paperwork. If you have not heard from [place] regarding the results in 2 weeks, please contact this office.     As we discussed, it is important you return for another visit to go through some of your concerns that we were unable to completely discuss today. I will check some lab work, but follow-up to discuss your abdominal distention,spacy feeling in the morning, rash on the genital area further, and can discuss the disability paperwork or letter further at next visit.  The rash in the genital area is likely pruritus ani, and most likely due to anal leakage. Use over-the-counter zinc oxide cream like Balmex to provide a barrier, then for itching, hydrocortisone was prescribed up to twice per day for no more than 2 weeks. Follow-up with me in the next 2 weeks to discuss this area further.  Take the shingles vaccine to your pharmacy to determine the cost. You were given Tdap today.  Return to the clinic or go to the nearest emergency room if any of your symptoms worsen or new symptoms occur.  Keeping you healthy  Get these tests  Blood pressure- Have your blood pressure checked once a year by your healthcare provider.  Normal blood pressure is 120/80  Weight- Have your body mass index (BMI) calculated to screen  for obesity.  BMI is a measure of body fat based on height and weight. You can also calculate your own BMI at [email or web address].  Cholesterol- Have your cholesterol checked every year.  Diabetes- Have your blood sugar checked regularly if you have high blood pressure, high cholesterol, have a family history of diabetes or if you are overweight.  Screening for Colon Cancer- Colonoscopy starting at age 63.  Screening may begin sooner depending on your family history and other health conditions. Follow up colonoscopy as directed by your Gastroenterologist.  Screening for Prostate Cancer- Both blood work (PSA) and a rectal exam help screen for Prostate Cancer.  Screening begins at age 71 with African-American men and at age 56 with Caucasian men.  Screening may begin sooner depending on your family history.  Take these medicines  Aspirin- One aspirin daily can help prevent Heart disease and Stroke.  Flu shot- Every fall.  Tetanus- Every 10 years.  Zostavax- Once after the age of 23 to prevent Shingles.  Pneumonia shot- Once after the age of 90; if you are younger than 23, ask your healthcare provider if you need a Pneumonia shot.  Take these steps  Don't smoke- If you do smoke, talk to your doctor about quitting.  For tips on how to quit, go to www.smokefree.gov or call 1-800-QUIT-NOW.  Be physically active- Exercise 5 days a week for at least 30 minutes.  If you are not already physically active start slow and gradually work up to 30 minutes of moderate physical activity.  Examples of moderate activity include walking briskly, mowing the yard, dancing, swimming, bicycling, etc.  Eat a healthy diet- Eat a variety of healthy food such as fruits, vegetables, low fat milk, low fat cheese, yogurt, lean meant, poultry, fish, beans, tofu, etc. For more information go to www.thenutritionsource.org  Drink alcohol in moderation- Limit alcohol intake to less than two drinks a day. Never drink  and drive.  Dentist- Brush and floss twice daily; visit your dentist twice a year.  Depression- Your emotional health is as important as your physical health. If you're feeling down, or losing interest in things you would normally enjoy please talk to your healthcare provider.  Eye exam- Visit your eye doctor every year.  Safe sex- If you may be exposed to a sexually transmitted infection, use a condom.  Seat belts- Seat belts can save your life; always wear one.  Smoke/Carbon Monoxide detectors- These detectors need to be installed on the appropriate level of your home.  Replace batteries at least once a year.  Skin cancer- When out in the sun, cover up and use sunscreen 15 SPF or higher.  Violence- If anyone is threatening you, please tell your healthcare provider.  Living Will/ Health care power of attorney- Speak with your healthcare provider and family.   Anal Pruritus  Anal pruritus is an itchy feeling in the anus and the skin in the anal area. This is common and can be caused by many things. It often occurs when the area becomes moist. Moisture may be due to sweating or a small amount of stool (feces) that is left on the area because of poor personal cleaning. Some other causes include:  Perfumed soaps and sprays.  Colored toilet paper.  Chemicals in the foods that you eat.  Dietary factors, such as caffeine, beer, milk products, chocolate, nuts, citrus fruits, tomatoes, spicy seasonings, jalapeno peppers, and salsa.  Hemorrhoids, fissures, infections, and other anal diseases.  Excessive washing.  Overuse of laxatives.  Skin disorders (psoriasis, eczema, or seborrhea).  Some medical disorders, such as diabetes or thyroid problems.  Diarrhea.  STDs (sexually transmitted diseases).  Some cancers. In many cases, the cause is not known. The itching usually goes away with treatment and home care. Scratching can cause further skin damage. HOME CARE INSTRUCTIONS  Pay  attention to any changes in your symptoms. Take these actions to help with your itching: Skin Care  Practice good hygiene.  Clean the anal area gently with wet toilet paper, baby wipes, or a wet washcloth after every bowel movement and at bedtime.  Avoid using soaps on the anal area.  Dry the area thoroughly. Pat the area dry with toilet paper or a towel.  Do not scrub the anal area with anything, including toilet paper.  Do not scratch the itchy area. Scratching produces more damage and makes the itching worse.  Take sitz baths in warm water as told by your health care provider. Pat the area dry with a soft cloth after each bath.  Use creams or ointments as told by your health care provider. Zinc oxide ointment or a moisture barrier cream can be applied several times per day to protect the skin.  Do not use anything that irritates the skin, such as bubble baths, scented toilet paper, or genital deodorants. General Instructions  Take over-the-counter and prescription medicines only as told by your health care provider.  Talk with your health care provider about fiber supplements. These are helpful in keeping your stool normal if  you have frequent loose stools.  Wear cotton underwear and loose clothing.  Keep all follow-up visits as told by your health care provider. This is important. SEEK MEDICAL CARE IF:  Your itching does not improve in several days.  Your itching gets worse.  You have a fever.  You have redness, swelling, or pain in the anal area.  You have fluid, blood, or pus coming from the anal area.   This information is not intended to replace advice given to you by your health care provider. Make sure you discuss any questions you have with your health care provider.   Document Released: 05/17/2011 Document Revised: 08/06/2015 Document Reviewed: 02/10/2015 Elsevier Interactive Patient Education [employer].

## 2016-07-12 NOTE — Progress Notes (Signed)
Subjective:  By signing my name below, I, Raven Small, attest that this documentation has been prepared under the direction and in the presence of Merri Ray, MD.  Electronically Signed: Thea Alken, ED Scribe. 07/12/2016. 8:37 AM.   Patient ID: Samuel Montgomery, male    DOB: May 09, 1956, 60 y.o.   MRN: [id number]  HPI Chief Complaint  Patient presents with  . Annual Exam    HPI Comments: Samuel Montgomery is a 60 y.o. male who presents to the [facility] for a physical. Previous PCP is DAUB, STEVE A, MD, but most recently seen by Dr. Joseph Art. Hx of Chron's with fistula. He has been unable to work due to unpredictable flares with chron's as well as a hernia. limiting to 5lbs or less based off previous notes. Has been on chronic pain medication. Also has hx of COPD, Peyronie's and psoriasis. .   Chron's with hx of fistula GI is Dr. Renee Harder at [facility]. Had colonoscopy in 2013 with ulcers in the colon. 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 He had an exploratory lap 07/2014 for by 4 x 6 cm abscess around mesh.   Frequent Bowel movements Pt reports frequent bowel movements, about 8-9 times a day. Last seen by Dr. Renee Harder in 12/2015. He states Dr. Renee Harder is aware of this and has been prescribed Cholestyramine.   Rash He has rash on buttocks which he suspect is due to fistula oozing. He has tried antifungal cream, applied 2-3 times a day for about a month without relief. He finds some relief to hydrocortisone cream.   Pt is needing a documentation that states that he cannot work due to [employer]. He reports flares with stress and heavy lifting. Pt reports pain and bowel incontinence with heavy lift. Pt is on disability. He states he has received documentation from Dr. Joseph Art in the past. 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 He has been on disability since 2015.   COPD He takes Advair 100/50 twice  per day. Pt still smokes. Pt smokes a couple packs a  day. He takes advair once a day, but still has break through symptoms of wheezing and coughing spells daily when waking up in the morning.    Cancer screening  Colonoscopy- in our system noted at 2013, but he reports most recently in  11/2014 with Dr. Renee Harder.  Prostate- agrees prostate testing today  Immunizations  Immunization History  Administered Date(s) Administered  . Influenza Split 11/29/2006, 10/18/2012  . Influenza,inj,Quad PF,36+ Mos 09/30/2013, 10/23/2014, 09/13/2015  . PPD Test 04/05/2012  . Pneumococcal Polysaccharide-23 07/10/2012  . Td 11/29/2004   TDAP- agrees to this today Shingles- he reports hx of shingles.   Depression Depression screen [facility] 2/9 07/12/2016 08/11/2015 04/01/2014  Decreased Interest 0 0 0  Down, Depressed, Hopeless 0 0 0  PHQ - 2 Score 0 0 0    Vision  Visual Acuity Screening   Right eye Left eye Both eyes  Without correction: 20/25 20/25 20/20   With correction:       He has not been seen by optho in the past year.   Dentist He reports being seen by dentist in the past 6 m.o to a year  Exercise    Abdominal distention  Pt has notice abdominal distention for the past years. Pt thought he was gaining weight. He denies fever, chills, nausea and vomiting.   Pt also notes some "morning spaciness", resolved after eating.  Pt is fasting today, he had a coffee, no cream or sugar.   Patient Active Problem List   Diagnosis Date Noted  . Long-term use of immunosuppressant medication 12/28/2014  . Peyronie's disease 04/15/2014  . Small bowel obstruction ([place]) 11/21/2012  . Nonspecific (abnormal) findings on radiological and other examination of gastrointestinal tract 07/02/2012  . 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 Cigarette smoker 05/02/2012  . Calcified granuloma of lung ([place]) 05/02/2012  . Alcohol dependence ([place]) 05/02/2012  . 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 Personal history of colonic polyps-rectal adenoma 03/30/2012    . Bilateral inguinal hernia 03/14/2012  . Psoriasis   . GERD (gastroesophageal reflux disease)    Past Medical History:  Diagnosis Date  . Adenomatous colon polyp 05/20/2005  . Adenomatous colon polyp 04/2005   colonoscopy by Dr Carlean Purl.   . Anemia   . Anxiety   . Arthritis   . COPD (chronic obstructive pulmonary disease) ([place])   . 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 ? Perianal fistula vs. Abscess    . Depression   . Diverticulosis of colon (without mention of hemorrhage)   . 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 GERD (gastroesophageal reflux disease)   . Inguinal hernia    bi-lateral  . Psoriasis   . Small bowel obstruction [facility])    Past Surgical History:  Procedure Laterality Date  . APPENDECTOMY  1979  . BOWEL RESECTION  05/14/2012   Procedure: SMALL BOWEL RESECTION;  Surgeon: Earnstine Regal, MD;  Location: [facility]S;  Service: General;  Laterality: N/A;  ileocecectomy  . COLONOSCOPY W/ BIOPSIES    . iliostomy    . INCISE AND DRAIN ABCESS    . INGUINAL HERNIA REPAIR     bilateral   . LAPAROTOMY  05/14/2012   Procedure: EXPLORATORY LAPAROTOMY;  Surgeon: Earnstine Regal, MD;  Location: [facility];  Service: General;  Laterality: N/A;  . VASECTOMY     No Known Allergies Prior to Admission medications   Medication Sig Start Date End Date Taking? Authorizing Provider  adalimumab (HUMIRA PEN) 40 MG/0.8ML injection Inject 40 mg sq every 2 weeks 08/22/12   Gatha Mayer, MD  Cyanocobalamin (VITAMIN B-12 IJ) Inject as directed.    Historical Provider, MD  Fluticasone-Salmeterol (ADVAIR) 100-50 MCG/DOSE AEPB Inhale 1 puff into the lungs 2 (two) times daily. 08/11/15   [name], MD  HYDROcodone-homatropine [facility]) 5-1.5 MG/5ML syrup Take 5 mLs by mouth every 4 (four) hours as needed. Patient not taking: Reported on 08/11/2015 12/28/14   Posey Boyer, MD  oxyCODONE-acetaminophen (PERCOCET/ROXICET) 5-325 MG per tablet Take 1 tablet by mouth every 4 (four)  hours as needed. For pain 08/11/15   [name], MD   Social History   Social History  . Marital status: Legally Separated    Spouse name: N/A  . Number of children: 2  . Years of education: N/A   Occupational History  . [employer]   .  [employer]   Social History Main Topics  . Smoking status: Current Some Day Smoker    Years: 40.00    Types: Cigarettes  . Smokeless tobacco: Former Systems developer    Types: Chew  . Alcohol use 0.0 oz/week     Comment: 2-3 every 4-5 days   . Drug use:     Types: Marijuana     Comment: per patient uses pot monthly.  . Sexual activity: No   Other Topics Concern  . Not on  file   Social History Narrative   Divorced; Lives alone    2 children.   Disabled.   Review of Systems 13 point ROS. Positive abdominal distention, rash, frequent bowel movements otherwise negative.   Objective:   Physical Exam  Constitutional: He appears well-developed and well-nourished. No distress.  HENT:  Head: Normocephalic.  Right Ear: External ear normal.  Left Ear: External ear normal.  Nose: Nose normal.  Mouth/Throat: Oropharynx is clear and moist. No oropharyngeal exudate.  Eyes: Conjunctivae and EOM are normal. Pupils are equal, round, and reactive to light.  Neck: Neck supple.  Cardiovascular: Normal rate, regular rhythm and normal heart sounds.   Pulmonary/Chest: Effort normal and breath sounds normal.  Abdominal: There is tenderness in the right upper quadrant. A hernia is present. Hernia confirmed positive in the left inguinal area ( easily reducible, although he was having pain on the right ).  Multiples healed scared. Tenderness mid upper aspect of scars as well as RUQ.   Genitourinary:  Genitourinary Comments: Perianal Multiple areas of erythema, raw appearing skin. Prostate- no apparent nodules  Skin: Skin is warm and dry.  Psychiatric: He has a normal mood and affect. His behavior is normal.    Vitals:   07/12/16 0840  BP:  120/74  Pulse: 82  Resp: 16  Temp: 98.6 F (37 C)  TempSrc: Oral  SpO2: 98%  Weight: 155 lb 9.6 oz (70.6 kg)  Height: 5\' 4"  (1.626 m)   Assessment & Plan:    Samuel Montgomery is a 60 y.o. male Annual physical exam  --anticipatory guidance as below in AVS, screening labs above. Health maintenance items as above in HPI discussed/recommended as applicable.   Frequent bowel movements  -Long-standing issue with history of Crohn's and loose stools. Has apparently discussed this with gastroenterology. Continue cholestyramine, and can discuss with GI if other treatments available.  Abdominal bloating Abdominal distension  - We'll check CMP, no significant distention seen on exam, but consider CT abdomen pelvis if increasing abdominal pain, or persistent feeling of distention. Sooner if fever, or worsening symptoms. Plans to follow-up to discuss this further with the next week.  Pruritus ani - Plan: DISCONTINUED: hydrocortisone cream 1 %, DISCONTINUED: hydrocortisone 1 % lotion Rash of genital area  - Pruritus ani likely with frequent fecal leakage. Zinc oxide barrier cream discussed, hydrocortisone if needed for itching for short-term only. RTC precautions if persistent.  Crohn's disease with fistula, unspecified gastrointestinal tract location [facility]) - Plan: CBC  -Continue follow-up with gastroenterology through [facility].  Chronic obstructive pulmonary disease, unspecified COPD type ([place])  - Stable. Recommended twice a day dosing of inhaler, but can look into lower cost options if needed.   -  Screening for diabetes mellitus - Plan: COMPLETE METABOLIC PANEL WITH GFR  Screening for hyperlipidemia - Plan: Lipid panel  Screening for prostate cancer - Plan: PSA  - We discussed pros and cons of prostate cancer screening, and after this discussion, he chose to have screening done. PSA obtained, and no concerning findings on DRE.   Need for Tdap vaccination - Plan:  Tdap vaccine greater than or equal to 7yo IM  Need for shingles vaccine - Plan: Zoster Vaccine Live, PF, (ZOSTAVAX) 57846 UNT/0.65ML injection  - Zostavax prescription printed. Can check into cost at his pharmacy.  Meds ordered this encounter  Medications  . cholestyramine (QUESTRAN) 4 g packet    Sig: Take 4 g by mouth.  . pantoprazole (PROTONIX) 40 MG  tablet    Sig: Take 40 mg by mouth.  . Zoster Vaccine Live, PF, (ZOSTAVAX) 91478 UNT/0.65ML injection    Sig: Inject 19,400 Units into the skin once.    Dispense:  1 each    Refill:  0  . DISCONTD: hydrocortisone cream 1 %  . DISCONTD: hydrocortisone 1 % lotion    Sig: Apply 1 application topically 2 (two) times daily. To perianal area as needed up to 2 weeks.    Dispense:  118 mL    Refill:  0  . hydrocortisone cream 1 %    Sig: Apply 1 application topically 2 (two) times daily. To the perianal as needed up to 2 weeks    Dispense:  30 g    Refill:  0   Patient Instructions       IF you received an x-ray today, you will receive an invoice from [facility]. Please contact [facility] at [phone] with questions or concerns regarding your invoice.   IF you received labwork today, you will receive an invoice from [employer]. Please contact [facility] at [phone] with questions or concerns regarding your invoice.   Our billing staff will not be able to assist you with questions regarding bills from these companies.  You will be contacted with the lab results as soon as they are available. The fastest way to get your results is to activate your My Chart account. Instructions are located on the last page of this paperwork. If you have not heard from [place] regarding the results in 2 weeks, please contact this office.     As we discussed, it is important you return for another visit to go through some of your concerns that we were unable to completely discuss today. I will check some lab  work, but follow-up to discuss your abdominal distention,spacy feeling in the morning, rash on the genital area further, and can discuss the disability paperwork or letter further at next visit.  The rash in the genital area is likely pruritus ani, and most likely due to anal leakage. Use over-the-counter zinc oxide cream like Balmex to provide a barrier, then for itching, hydrocortisone was prescribed up to twice per day for no more than 2 weeks. Follow-up with me in the next 2 weeks to discuss this area further.  Take the shingles vaccine to your pharmacy to determine the cost. You were given Tdap today.  Return to the clinic or go to the nearest emergency room if any of your symptoms worsen or new symptoms occur.  Keeping you healthy  Get these tests  Blood pressure- Have your blood pressure checked once a year by your healthcare provider.  Normal blood pressure is 120/80  Weight- Have your body mass index (BMI) calculated to screen for obesity.  BMI is a measure of body fat based on height and weight. You can also calculate your own BMI at [employer]Banking.si.  Cholesterol- Have your cholesterol checked every year.  Diabetes- Have your blood sugar checked regularly if you have high blood pressure, high cholesterol, have a family history of diabetes or if you are overweight.  Screening for Colon Cancer- Colonoscopy starting at age 57.  Screening may begin sooner depending on your family history and other health conditions. Follow up colonoscopy as directed by your Gastroenterologist.  Screening for Prostate Cancer- Both blood work (PSA) and a rectal exam help screen for Prostate Cancer.  Screening begins at age 35 with African-American men and at age 24 with  Caucasian men.  Screening may begin sooner depending on your family history.  Take these medicines  Aspirin- One aspirin daily can help prevent Heart disease and Stroke.  Flu shot- Every fall.  Tetanus- Every 10  years.  Zostavax- Once after the age of 10 to prevent Shingles.  Pneumonia shot- Once after the age of 35; if you are younger than 62, ask your healthcare provider if you need a Pneumonia shot.  Take these steps  Don't smoke- If you do smoke, talk to your doctor about quitting.  For tips on how to quit, go to www.smokefree.gov or call 1-800-QUIT-NOW.  Be physically active- Exercise 5 days a week for at least 30 minutes.  If you are not already physically active start slow and gradually work up to 30 minutes of moderate physical activity.  Examples of moderate activity include walking briskly, mowing the yard, dancing, swimming, bicycling, etc.  Eat a healthy diet- Eat a variety of healthy food such as fruits, vegetables, low fat milk, low fat cheese, yogurt, lean meant, poultry, fish, beans, tofu, etc. For more information go to www.thenutritionsource.org  Drink alcohol in moderation- Limit alcohol intake to less than two drinks a day. Never drink and drive.  Dentist- Brush and floss twice daily; visit your dentist twice a year.  Depression- Your emotional health is as important as your physical health. If you're feeling down, or losing interest in things you would normally enjoy please talk to your healthcare provider.  Eye exam- Visit your eye doctor every year.  Safe sex- If you may be exposed to a sexually transmitted infection, use a condom.  Seat belts- Seat belts can save your life; always wear one.  Smoke/Carbon Monoxide detectors- These detectors need to be installed on the appropriate level of your home.  Replace batteries at least once a year.  Skin cancer- When out in the sun, cover up and use sunscreen 15 SPF or higher.  Violence- If anyone is threatening you, please tell your healthcare provider.  Living Will/ Health care power of attorney- Speak with your healthcare provider and family.   Anal Pruritus  Anal pruritus is an itchy feeling in the anus and the skin in  the anal area. This is common and can be caused by many things. It often occurs when the area becomes moist. Moisture may be due to sweating or a small amount of stool (feces) that is left on the area because of poor personal cleaning. Some other causes include:  Perfumed soaps and sprays.  Colored toilet paper.  Chemicals in the foods that you eat.  Dietary factors, such as caffeine, beer, milk products, chocolate, nuts, citrus fruits, tomatoes, spicy seasonings, jalapeno peppers, and salsa.  Hemorrhoids, fissures, infections, and other anal diseases.  Excessive washing.  Overuse of laxatives.  Skin disorders (psoriasis, eczema, or seborrhea).  Some medical disorders, such as diabetes or thyroid problems.  Diarrhea.  STDs (sexually transmitted diseases).  Some cancers. In many cases, the cause is not known. The itching usually goes away with treatment and home care. Scratching can cause further skin damage. HOME CARE INSTRUCTIONS  Pay attention to any changes in your symptoms. Take these actions to help with your itching: Skin Care  Practice good hygiene.  Clean the anal area gently with wet toilet paper, baby wipes, or a wet washcloth after every bowel movement and at bedtime.  Avoid using soaps on the anal area.  Dry the area thoroughly. Pat the area dry with toilet paper or  a towel.  Do not scrub the anal area with anything, including toilet paper.  Do not scratch the itchy area. Scratching produces more damage and makes the itching worse.  Take sitz baths in warm water as told by your health care provider. Pat the area dry with a soft cloth after each bath.  Use creams or ointments as told by your health care provider. Zinc oxide ointment or a moisture barrier cream can be applied several times per day to protect the skin.  Do not use anything that irritates the skin, such as bubble baths, scented toilet paper, or genital deodorants. General Instructions  Take  over-the-counter and prescription medicines only as told by your health care provider.  Talk with your health care provider about fiber supplements. These are helpful in keeping your stool normal if you have frequent loose stools.  Wear cotton underwear and loose clothing.  Keep all follow-up visits as told by your health care provider. This is important. SEEK MEDICAL CARE IF:  Your itching does not improve in several days.  Your itching gets worse.  You have a fever.  You have redness, swelling, or pain in the anal area.  You have fluid, blood, or pus coming from the anal area.   This information is not intended to replace advice given to you by your health care provider. Make sure you discuss any questions you have with your health care provider.   Document Released: 05/17/2011 Document Revised: 08/06/2015 Document Reviewed: 02/10/2015 Elsevier Interactive Patient Education [employer].    I personally performed the services described in this documentation, which was scribed in my presence. The recorded information has been reviewed and considered, and addended by me as needed.   Signed,   Merri Ray, MD [facility].  07/13/16 8:27 AM

## 2016-07-29 ENCOUNTER — Ambulatory Visit (INDEPENDENT_AMBULATORY_CARE_PROVIDER_SITE_OTHER): Payer: Commercial Managed Care - HMO | Admitting: Family Medicine

## 2016-07-29 ENCOUNTER — Encounter (HOSPITAL_COMMUNITY): Payer: Self-pay | Admitting: Emergency Medicine

## 2016-07-29 ENCOUNTER — Emergency Department (HOSPITAL_COMMUNITY)
Admission: EM | Admit: 2016-07-29 | Discharge: 2016-07-29 | Disposition: A | Discharge status: Home / Self Care | Payer: Commercial Managed Care - HMO | Attending: Emergency Medicine | Admitting: Emergency Medicine

## 2016-07-29 ENCOUNTER — Encounter: Payer: Self-pay | Admitting: Family Medicine

## 2016-07-29 VITALS — BP 118/70 | HR 76 | Temp 99.3°F | Resp 18 | Ht 64.0 in | Wt 159.0 lb

## 2016-07-29 DIAGNOSIS — Y929 Unspecified place or not applicable: Secondary | ICD-10-CM | POA: Insufficient documentation

## 2016-07-29 DIAGNOSIS — W57XXXA Bitten or stung by nonvenomous insect and other nonvenomous arthropods, initial encounter: Secondary | ICD-10-CM | POA: Insufficient documentation

## 2016-07-29 DIAGNOSIS — K50113 Crohn's disease of large intestine with fistula: Secondary | ICD-10-CM

## 2016-07-29 DIAGNOSIS — S60561A Insect bite (nonvenomous) of right hand, initial encounter: Secondary | ICD-10-CM

## 2016-07-29 DIAGNOSIS — I891 Lymphangitis: Secondary | ICD-10-CM

## 2016-07-29 DIAGNOSIS — L03113 Cellulitis of right upper limb: Secondary | ICD-10-CM | POA: Diagnosis not present

## 2016-07-29 DIAGNOSIS — J449 Chronic obstructive pulmonary disease, unspecified: Secondary | ICD-10-CM | POA: Diagnosis not present

## 2016-07-29 DIAGNOSIS — F17210 Nicotine dependence, cigarettes, uncomplicated: Secondary | ICD-10-CM | POA: Diagnosis not present

## 2016-07-29 DIAGNOSIS — Y999 Unspecified external cause status: Secondary | ICD-10-CM | POA: Insufficient documentation

## 2016-07-29 DIAGNOSIS — R748 Abnormal levels of other serum enzymes: Secondary | ICD-10-CM

## 2016-07-29 DIAGNOSIS — R7989 Other specified abnormal findings of blood chemistry: Secondary | ICD-10-CM

## 2016-07-29 DIAGNOSIS — Y939 Activity, unspecified: Secondary | ICD-10-CM | POA: Diagnosis not present

## 2016-07-29 LAB — I-STAT CG4 LACTIC ACID, ED: LACTIC ACID, VENOUS: 0.97 mmol/L (ref 0.5–1.9)

## 2016-07-29 LAB — BASIC METABOLIC PANEL
Anion gap: 9 (ref 5–15)
BUN: 17 mg/dL (ref 6–20)
CHLORIDE: 102 mmol/L (ref 101–111)
CO2: 25 mmol/L (ref 22–32)
Calcium: 9.1 mg/dL (ref 8.9–10.3)
Creatinine, Ser: 1.56 mg/dL — ABNORMAL HIGH (ref 0.61–1.24)
GFR calc Af Amer: 54 mL/min — ABNORMAL LOW (ref >60)
GFR calc non Af Amer: 46 mL/min — ABNORMAL LOW (ref >60)
Glucose, Bld: 93 mg/dL (ref 65–99)
POTASSIUM: 3.5 mmol/L (ref 3.5–5.1)
SODIUM: 136 mmol/L (ref 135–145)

## 2016-07-29 LAB — CBC WITH DIFFERENTIAL/PLATELET
Basophils Absolute: 0 10*3/uL (ref 0.0–0.1)
Basophils Relative: 0 %
Eosinophils Absolute: 0 10*3/uL (ref 0.0–0.7)
Eosinophils Relative: 1 %
HEMATOCRIT: 44.9 % (ref 39.0–52.0)
HEMOGLOBIN: 16 g/dL (ref 13.0–17.0)
LYMPHS ABS: 1.8 10*3/uL (ref 0.7–4.0)
LYMPHS PCT: 30 %
MCH: 32.5 pg (ref 26.0–34.0)
MCHC: 35.6 g/dL (ref 30.0–36.0)
MCV: 91.3 fL (ref 78.0–100.0)
Monocytes Absolute: 1 10*3/uL (ref 0.1–1.0)
Monocytes Relative: 16 %
NEUTROS ABS: 3.3 10*3/uL (ref 1.7–7.7)
Neutrophils Relative %: 53 %
Platelets: 177 10*3/uL (ref 150–400)
RBC: 4.92 MIL/uL (ref 4.22–5.81)
RDW: 13.5 % (ref 11.5–15.5)
WBC: 6.2 10*3/uL (ref 4.0–10.5)

## 2016-07-29 MED ORDER — TIOTROPIUM BROMIDE MONOHYDRATE 18 MCG IN CAPS: 18.0000 ug | ORAL_CAPSULE | Freq: Every day | RESPIRATORY_TRACT | 5 refills | Status: AC

## 2016-07-29 MED ORDER — DEXTROSE 5 % IV SOLN
1.0000 g | Freq: Once | INTRAVENOUS | Status: AC
  Administered 2016-07-29: 1 g via INTRAVENOUS
  Filled 2016-07-29: qty 10

## 2016-07-29 MED ORDER — ALBUTEROL SULFATE HFA 108 (90 BASE) MCG/ACT IN AERS: 1.0000 | INHALATION_SPRAY | RESPIRATORY_TRACT | 0 refills | Status: AC | PRN

## 2016-07-29 MED ORDER — DOXYCYCLINE HYCLATE 100 MG PO CAPS
100.0000 mg | ORAL_CAPSULE | Freq: Two times a day (BID) | ORAL | 0 refills | Status: AC
Start: 2016-07-29 — End: ?

## 2016-07-29 NOTE — ED Triage Notes (Addendum)
Pt reports having bug bite on right hand that formed a white head and blister that he popped x1 week ago. Has been treating with hydrocortisone cream. Today noticed red streaks and pain going up right arm. Was at PCP and was referred here for further evaluation. Patient states that he is on Humira. Also reports decreased appetite over last week. Denies fevers.

## 2016-07-29 NOTE — Progress Notes (Addendum)
By signing my name below, I, Mesha Guinyard, attest that this documentation has been prepared under the direction and in the presence of Merri Ray.  Electronically Signed: Verlee Monte, Medical Scribe. 07/29/16. 4:19 PM.  Subjective:    Patient ID: Samuel Montgomery, male    DOB: 25-Dec-1955, 60 y.o.   MRN: [id number]  HPI Chief Complaint  Patient presents with  . Follow-up    physical  . Insect Bite     right hand    HPI Comments: Samuel Montgomery is a 60 y.o. male who presents to the [facility] for follow-up. He was last seen for annual exam and establishing care 2 weeks ago.  Elevated Creatinine: See last visit. Pt had dehydration problems and had kidney damage from it "a while ago". Pt mentions he has on IV for a month awhile ago. Elevation from baseline was noted at last visit. Lab Results  Component Value Date   CREATININE 1.36 (H) 07/12/2016   Chron disease: He has had chron's with fistula. Difficulty with work due to unpredictable flares and frequent bm, 8-9 times a day. 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 He had been on disability since 2015 due to his difficulty to his Chrons disease. He has flares of chron's sxs with stressful situations at work. GI is Dr. Renee Harder at [facility], he has been prescribed Cholestyramine. Last visit approximately 1 year ago.  Pt has been out of work since 2015 due to [employer], hernias, and post-op abdominal wall hernias. Pt states when he picks something up he can feel his abdominal hernia burn. Pt can't carry 2 milk jugs across a room without pain, and can feel the abdominal burning when carrying one milk jug.  Pt mentions "all of the doctors pointed to each other" and states his GI doctor wouldn't write his disability letter because it was up to another doctor. Pt went through social security specialist in 2014 when he had his first surgery. [employer] Falling has his long term disability, and they give him  3/4 of his original salary. Pt was told by [employer] Falling that they'll handle his disability. Pt would like to get a letter if needed for insurance.  Stress flares his Crohn's, and when seen by a therapist in the past, was told by a therapist to "chill". Pt states he doesn't do well on psychoactive medications and deferred Prozac, Klonopin, or any other anxiety medications.  COPD: Still smoker when discussed at last visit. He had been prescribed Advair BID but had been using it QD. Recommended to increase Advair to BID, but if stable considered other medications as may not need Advair. Pt had a pulmonary function test Jan 2015, and doesn't mind getting another test done.  Pt reports worsening SOB and states he's been using Advair BID for relief to his symptoms. Pt is having some financial concerns with the cost of Advair since he has to pay $50 to buy it, and using it BID it will cost him $100 a month, which is too much for him. Pt smokes a couple of packs a day. Pt has SOB walking up short hills, and has to walk slower to catch his breath. Pt has had a spirometry measure at [facility] for his surgery.  Pruritus Ani: see last ov. Thought to be due to frequent fecal leakage. Recommended Zinc Oxide barrier cream, topical Hydrocortisone short term only PRN.    Abdominal Bloating: He did have a  concern last visit. I did not see any distension at his last visit. He had a reassuring CBC and nl CMP/LFTs.  Pt still feels gradual abdominal bloating for the past year. Pt mistook his bloating for weight gain and suspected to weigh more today although "food goes right through" him. Pt hasn't been to the gastroenterology in the past year, but plans on seeing his GI doctor before December ends. Pt denies fevers.  Insect Bite on right hand. [place] he noticed a blister on his hand. Pt states when it came to head he squeezed and popped it a week ago and it drained clear fluid. Pt reports right  axilla tenderness near his lymph nodes, radiating pain in his forearm for the past 2-3 days, and decreased appetite. Pt is outdoors often and also reports a couple of tick bites around his groin from a couple of months ago. Pt denies erythema from the site, fevers, and chills.  SHx: Pt will be in [place] until 08/06/16 and he'll temporarily move to [place]- he's not committed to moving there and will have a decision about his living arrangements until Feb 2018. Pt likes having his doctors close to him since thy know him and is hesitating to switching his insurance to another state.  Patient Active Problem List   Diagnosis Date Noted  . Long-term use of immunosuppressant medication 12/28/2014  . Peyronie's disease 04/15/2014  . Small bowel obstruction ([place]) 11/21/2012  . Nonspecific (abnormal) findings on radiological and other examination of gastrointestinal tract 07/02/2012  . 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 Cigarette smoker 05/02/2012  . Calcified granuloma of lung ([place]) 05/02/2012  . Alcohol dependence ([place]) 05/02/2012  . Crohn's ileocolitis ([place]) 04/05/2012  . Personal history of colonic polyps-rectal adenoma 03/30/2012  . Bilateral inguinal hernia 03/14/2012  . Psoriasis   . GERD (gastroesophageal reflux disease)    Past Medical History:  Diagnosis Date  . Adenomatous colon polyp 05/20/2005  . Adenomatous colon polyp 04/2005   colonoscopy by Dr Carlean Purl.   . Anemia   . Anxiety   . Arthritis   . COPD (chronic obstructive pulmonary disease) ([place])   . 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 ? Perianal fistula vs. Abscess    . Depression   . Diverticulosis of colon (without mention of hemorrhage)   . 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 GERD (gastroesophageal reflux disease)   . Inguinal hernia    bi-lateral  . Psoriasis   . Small bowel obstruction [facility])    Past Surgical History:  Procedure Laterality Date  . APPENDECTOMY  1979  . BOWEL RESECTION   05/14/2012   Procedure: SMALL BOWEL RESECTION;  Surgeon: Earnstine Regal, MD;  Location: [facility];  Service: General;  Laterality: N/A;  ileocecectomy  . COLONOSCOPY W/ BIOPSIES    . iliostomy    . INCISE AND DRAIN ABCESS    . INGUINAL HERNIA REPAIR     bilateral   . LAPAROTOMY  05/14/2012   Procedure: EXPLORATORY LAPAROTOMY;  Surgeon: Earnstine Regal, MD;  Location: [facility];  Service: General;  Laterality: N/A;  . VASECTOMY     No Known Allergies Prior to Admission medications   Medication Sig Start Date End Date Taking? Authorizing Provider  adalimumab (HUMIRA PEN) 40 MG/0.8ML injection Inject 40 mg sq every 2 weeks 08/22/12   Gatha Mayer, MD  cholestyramine Lucrezia Starch) 4 g packet Take 4 g by mouth. 02/10/16  Historical Provider, MD  Cyanocobalamin (VITAMIN B-12 IJ) Inject as directed.    Historical Provider, MD  Fluticasone-Salmeterol (ADVAIR) 100-50 MCG/DOSE AEPB Inhale 1 puff into the lungs 2 (two) times daily. 08/11/15   [name], MD  hydrocortisone cream 1 % Apply 1 application topically 2 (two) times daily. To the perianal as needed up to 2 weeks 07/12/16   Wendie Agreste, MD  oxyCODONE-acetaminophen (PERCOCET/ROXICET) 5-325 MG per tablet Take 1 tablet by mouth every 4 (four) hours as needed. For pain 08/11/15   [name], MD  pantoprazole (PROTONIX) 40 MG tablet Take 40 mg by mouth. 05/04/13   Historical Provider, MD   Social History   Social History  . Marital status: Legally Separated    Spouse name: N/A  . Number of children: 2  . Years of education: N/A   Occupational History  . [employer]   .  [employer]   Social History Main Topics  . Smoking status: Current Some Day Smoker    Years: 40.00    Types: Cigarettes  . Smokeless tobacco: Former Systems developer    Types: Chew  . Alcohol use 0.0 oz/week     Comment: 2-3 every 4-5 days   . Drug use:     Types: Marijuana     Comment: per patient uses pot monthly.  . Sexual activity: No   Other Topics  Concern  . Not on file   Social History Narrative   Divorced; Lives alone    2 children.   Disabled.   Depression screen [facility] 2/9 07/29/2016 07/12/2016 08/11/2015 04/01/2014  Decreased Interest 0 0 0 0  Down, Depressed, Hopeless 0 0 0 0  PHQ - 2 Score 0 0 0 0   Review of Systems  Constitutional: Positive for appetite change. Negative for chills and fever.  Respiratory: Positive for shortness of breath.   Gastrointestinal: Positive for abdominal distention and abdominal pain (chronic).  Skin: Negative for color change.   Objective:  Physical Exam  Constitutional: He appears well-developed and well-nourished. No distress.  HENT:  Head: Normocephalic and atraumatic.  Eyes: Conjunctivae are normal.  Neck: Neck supple.  Cardiovascular: Normal rate.   Pulmonary/Chest: Effort normal and breath sounds normal. No respiratory distress. He has no wheezes. He has no rales.  MRC dyspnea index grade 3  Abdominal: Soft. He exhibits no distension. There is tenderness.  Multiple healed scars Minimal tenderness diffusely but no focal pain  Lymphadenopathy:  Tender axiallry lymp on the right No epitroclear nodes  Neurological: He is alert.  Skin: Skin is warm and dry. There is erythema.  Small 7 mm ulcer appearing area on the dorsum of the right hand with erythema approx 3-4 mm around wound. No exudate, no induration Tenderness on the dorsal R forearm without appreciable erythema, but there is a streak/linear patch that measures approx 14 cm from the medial elbow to the inner upper arm to axilla with tender axillary node.  Under right forearm: Small erythematous patch approx 1cm  Psychiatric: He has a normal mood and affect. His behavior is normal.  Nursing note and vitals reviewed.  BP 118/70 (BP Location: Left Arm, Patient Position: Sitting, Cuff Size: Small)   Pulse 76   Temp 99.3 F (37.4 C) (Oral)   Resp 18   Ht 5\' 4"  (1.626 m)   Wt 159 lb (72.1 kg)   SpO2 96%   BMI 27.29 kg/m    Assessment & Plan:  Spent over 40 mins  face to  face care during visit.  Samuel Montgomery is a 60 y.o. male Chronic obstructive pulmonary disease, unspecified COPD type ([place]) - Plan: tiotropium (SPIRIVA HANDIHALER) 18 MCG inhalation capsule, albuterol (PROVENTIL HFA;VENTOLIN HFA) 108 (90 Base) MCG/ACT inhaler  -Due to cost of Advair, discussed other options. We'll try Spiriva to see if this is less expensive. Albuterol if needed for breakthrough symptoms. Recommended follow-up with pulmonary either here or in [place] for further evaluation and possible repeat spirometry to direct therapy. RTC/ER precautions if worse  Crohn's colitis, with fistula ([place])  - With reports of abdominal distention over past year, advised to follow-up with his gastroenterologist as no recent evaluation.  -Note provided for work due to difficulty with frequent bowel movements, anal leakage/fecal soiling, and pain from previous hernias and surgical intervention. Discussed that I would need further details from his gastroenterologist determining longevity of these symptoms, plan for Crohns  and potential for return to work. He may also need to have repeat disability evaluation if permanently out of work.  Lymphangitis Insect bite hand, right, initial encounter - Plan: CANCELED: POCT CBC  - Possible initial infection in the hand, now with suspected lymphangitis up the right arm. He is on immune suppressant Humira. Evaluate through ER for possible admission and IV antibiotics. Camera operator at [facility] ER advised.  Elevated serum creatinine  - Deferred blood work in office today as he was going to be evaluated emergency room. Advised if they check creatinine in the ER and it is normal, no further workup at this time. If elevated creatinine to the ER/hospital, can follow-up with me to discuss further.  Meds ordered this encounter           . tiotropium (SPIRIVA HANDIHALER) 18 MCG inhalation capsule    Sig: Place 1  capsule (18 mcg total) into inhaler and inhale daily.    Dispense:  30 capsule    Refill:  5  . albuterol (PROVENTIL HFA;VENTOLIN HFA) 108 (90 Base) MCG/ACT inhaler    Sig: Inhale 1-2 puffs into the lungs every 4 (four) hours as needed for wheezing or shortness of breath.    Dispense:  1 Inhaler    Refill:  0   Patient Instructions   You can try Spiriva to see if it is less expensive than Advair.  If any worsening of your symptoms with just Spiriva, you can restart Advair. If needed, you could take both medications. I also wrote for short acting albuterol if needed for shortness of breath symptoms, but this should be followed up with lung specialist here or in [place] within in next month for possible repeat spirometry (lung function test).  Let me know where to refer you. If you require albuterol more than once per day, or any worsening of shortness of breath - return here or emergency room.   For Crohn's and abdominal hernias, I have provided a note out of work until specific recommendations can be made by your gastroenterologist regarding ability to return to work or expected outcomes/longevity of your symptoms with the Crohn's colitis and the impact of work on [employer]. If further paperwork needed, I will be happy to review it, but may need to have assessment with disability specialist.   For abdominal bloating, I would like you to call your gastroenterologist to see you and decide if CT scanning or other evaluation needed. Let them know about your symptoms. If you move to [place], your gastroenterologist may need to refer you to provider locally.   Return  to the clinic or go to the nearest emergency room if any of your symptoms worsen or new symptoms occur.  I did not recheck kidney test here today, as no blood drawn in office, but this can be rechecked in ER to make sure it is not increasing. Follow up with me after your emergency room or hospital stay if needed for repeat kidney  testing.  Unfortunately the redness on your arm looks like a lymphangitis. This can be a deeper infection, and can get worse without IV antibiotics. After leaving our office, go to [facility] emergency room for further evaluation and possible admission.  Chronic Obstructive Pulmonary Disease Chronic obstructive pulmonary disease (COPD) is a common lung condition in which airflow from the lungs is limited. COPD is a general term that can be used to describe many different lung problems that limit airflow, including both chronic bronchitis and emphysema. If you have COPD, your lung function will probably never return to normal, but there are measures you can take to improve lung function and make yourself feel better. CAUSES   Smoking (common).  Exposure to secondhand smoke.  Genetic problems.  Chronic inflammatory lung diseases or recurrent infections. SYMPTOMS  Shortness of breath, especially with physical activity.  Deep, persistent (chronic) cough with a large amount of thick mucus.  Wheezing.  Rapid breaths (tachypnea).  Gray or bluish discoloration (cyanosis) of the skin, especially in your fingers, toes, or lips.  Fatigue.  Weight loss.  Frequent infections or episodes when breathing symptoms become much worse (exacerbations).  Chest tightness. DIAGNOSIS Your health care provider will take a medical history and perform a physical examination to diagnose COPD. Additional tests for COPD may include:  Lung (pulmonary) function tests.  Chest X-ray.  CT scan.  Blood tests. TREATMENT  Treatment for COPD may include:  Inhaler and nebulizer medicines. These help manage the symptoms of COPD and make your breathing more comfortable.  Supplemental oxygen. Supplemental oxygen is only helpful if you have a low oxygen level in your blood.  Exercise and physical activity. These are beneficial for nearly all people with COPD.  Lung surgery or transplant.  Nutrition  therapy to gain weight, if you are underweight.  Pulmonary rehabilitation. This may involve working with a team of health care providers and specialists, such as respiratory, occupational, and physical therapists. HOME CARE INSTRUCTIONS  Take all medicines (inhaled or pills) as directed by your health care provider.  Avoid over-the-counter medicines or cough syrups that dry up your airway (such as antihistamines) and slow down the elimination of secretions unless instructed otherwise by your health care provider.  If you are a smoker, the most important thing that you can do is stop smoking. Continuing to smoke will cause further lung damage and breathing trouble. Ask your health care provider for help with quitting smoking. He or she can direct you to community resources or hospitals that provide support.  Avoid exposure to irritants such as smoke, chemicals, and fumes that aggravate your breathing.  Use oxygen therapy and pulmonary rehabilitation if directed by your health care provider. If you require home oxygen therapy, ask your health care provider whether you should purchase a pulse oximeter to measure your oxygen level at home.  Avoid contact with individuals who have a contagious illness.  Avoid extreme temperature and humidity changes.  Eat healthy foods. Eating smaller, more frequent meals and resting before meals may help you maintain your strength.  Stay active, but balance activity with periods of rest.  Exercise and physical activity will help you maintain your ability to do things you want to do.  Preventing infection and hospitalization is very important when you have COPD. Make sure to receive all the vaccines your health care provider recommends, especially the pneumococcal and influenza vaccines. Ask your health care provider whether you need a pneumonia vaccine.  Learn and use relaxation techniques to manage stress.  Learn and use controlled breathing techniques as  directed by your health care provider. Controlled breathing techniques include:  Pursed lip breathing. Start by breathing in (inhaling) through your nose for 1 second. Then, purse your lips as if you were going to whistle and breathe out (exhale) through the pursed lips for 2 seconds.  Diaphragmatic breathing. Start by putting one hand on your abdomen just above your waist. Inhale slowly through your nose. The hand on your abdomen should move out. Then purse your lips and exhale slowly. You should be able to feel the hand on your abdomen moving in as you exhale.  Learn and use controlled coughing to clear mucus from your lungs. Controlled coughing is a series of short, progressive coughs. The steps of controlled coughing are: 1. Lean your head slightly forward. 2. Breathe in deeply using diaphragmatic breathing. 3. Try to hold your breath for 3 seconds. 4. Keep your mouth slightly open while coughing twice. 5. Spit any mucus out into a tissue. 6. Rest and repeat the steps once or twice as needed. SEEK MEDICAL CARE IF:  You are coughing up more mucus than usual.  There is a change in the color or thickness of your mucus.  Your breathing is more labored than usual.  Your breathing is faster than usual. SEEK IMMEDIATE MEDICAL CARE IF:  You have shortness of breath while you are resting.  You have shortness of breath that prevents you from:  Being able to talk.  Performing your usual physical activities.  You have chest pain lasting longer than 5 minutes.  Your skin color is more cyanotic than usual.  You measure low oxygen saturations for longer than 5 minutes with a pulse oximeter. MAKE SURE YOU:  Understand these instructions.  Will watch your condition.  Will get help right away if you are not doing well or get worse.   This information is not intended to replace advice given to you by your health care provider. Make sure you discuss any questions you have with your  health care provider.   Document Released: 08/25/2005 Document Revised: 12/06/2014 Document Reviewed: 07/12/2013 Elsevier Interactive Patient Education [employer].   IF you received an x-ray today, you will receive an invoice from [facility]. Please contact [facility] at [phone] with questions or concerns regarding your invoice.   IF you received labwork today, you will receive an invoice from [employer]. Please contact [facility] at [phone] with questions or concerns regarding your invoice.   Our billing staff will not be able to assist you with questions regarding bills from these companies.  You will be contacted with the lab results as soon as they are available. The fastest way to get your results is to activate your My Chart account. Instructions are located on the last page of this paperwork. If you have not heard from [place] regarding the results in 2 weeks, please contact this office.        I personally performed the services described in this documentation, which was scribed in my presence. The recorded information has been reviewed  and considered, and addended by me as needed.   Signed,   Merri Ray, MD [facility].  07/29/16 6:12 PM

## 2016-07-29 NOTE — ED Provider Notes (Signed)
[facility] DEPT Provider Note   CSN: [id number] Arrival date & time: 07/29/16  1804     History   Chief Complaint Chief Complaint  Patient presents with  . Insect Bite    HPI Samuel Montgomery is a 60 y.o. male.  HPI The patient was sent to the emergency room for evaluation of an infection of his right hand that seems to be spreading up his arm.  The history is obtained from the patient as well as the notes from his medical visit at the urgent care. Per Dr. Rolly Salter note "Insect Bite on right hand. [place] he noticed a blister on his hand. Pt states when it came to head he squeezed and popped it a week ago and it drained clear fluid. Pt reports right axilla tenderness near his lymph nodes, radiating pain in his forearm for the past 2-3 days, and decreased appetite. Pt is outdoors often and also reports a couple of tick bites around his groin from a couple of months ago. Pt denies erythema from the site, fevers, and chills."  Patient confirms that this history is accurate. He denies any fevers or chills. He is not having any vomiting. He denies any weakness.  Patient states that he was sent here to receive IV antibiotics and hopefully can go home after that. Past Medical History:  Diagnosis Date  . Adenomatous colon polyp 05/20/2005  . Adenomatous colon polyp 04/2005   colonoscopy by Dr Carlean Purl.   . Anemia   . Anxiety   . Arthritis   . COPD (chronic obstructive pulmonary disease) ([place])   . Crohn's ileocolitis ([place]) 04/05/2012   Diagnosed by colonoscopy 03/2012. ? Perianal fistula vs. Abscess    . Depression   . Diverticulosis of colon (without mention of hemorrhage)   . 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 GERD (gastroesophageal reflux disease)   . Inguinal hernia    bi-lateral  . Psoriasis   . Small bowel obstruction [facility])     Patient Active Problem List   Diagnosis Date Noted  . Long-term use of immunosuppressant medication 12/28/2014  .  Peyronie's disease 04/15/2014  . Small bowel obstruction ([place]) 11/21/2012  . Nonspecific (abnormal) findings on radiological and other examination of gastrointestinal tract 07/02/2012  . 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 Cigarette smoker 05/02/2012  . 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 Alcohol dependence ([place]) 05/02/2012  . Crohn's ileocolitis ([place]) 04/05/2012  . Personal history of colonic polyps-rectal adenoma 03/30/2012  . Bilateral inguinal hernia 03/14/2012  . Psoriasis   . GERD (gastroesophageal reflux disease)     Past Surgical History:  Procedure Laterality Date  . APPENDECTOMY  1979  . BOWEL RESECTION  05/14/2012   Procedure: SMALL BOWEL RESECTION;  Surgeon: Earnstine Regal, MD;  Location: [facility] ORS;  Service: General;  Laterality: N/A;  ileocecectomy  . COLONOSCOPY W/ BIOPSIES    . iliostomy    . INCISE AND DRAIN ABCESS    . INGUINAL HERNIA REPAIR     bilateral   . LAPAROTOMY  05/14/2012   Procedure: EXPLORATORY LAPAROTOMY;  Surgeon: Earnstine Regal, MD;  Location: [facility] ORS;  Service: General;  Laterality: N/A;  . VASECTOMY         Home Medications    Prior to Admission medications   Medication Sig Start Date End Date Taking? Authorizing Provider  adalimumab (HUMIRA PEN) 40 MG/0.8ML injection Inject 40 mg sq every 2 weeks 08/22/12  Yes Glendell Docker  Simonne Maffucci, MD  cholestyramine (QUESTRAN) 4 g packet Take 4 g by mouth daily as needed (crohns symptoms).  02/10/16  Yes Historical Provider, MD  Cyanocobalamin (VITAMIN B-12 IJ) Inject 1 each as directed every 28 (twenty-eight) days.    Yes Historical Provider, MD  Fluticasone-Salmeterol (ADVAIR) 100-50 MCG/DOSE AEPB Inhale 1 puff into the lungs 2 (two) times daily. 08/11/15  Yes [name], MD  hydrocortisone cream 1 % Apply 1 application topically 2 (two) times daily. To the perianal as needed up to 2 weeks Patient taking differently: Apply 1 application topically 2 (two) times daily as needed for itching. To the  perianal as needed up to 2 weeks 07/12/16  Yes Wendie Agreste, MD  oxyCODONE-acetaminophen (PERCOCET/ROXICET) 5-325 MG per tablet Take 1 tablet by mouth every 4 (four) hours as needed. For pain Patient taking differently: Take 1 tablet by mouth every 4 (four) hours as needed for moderate pain.  08/11/15  Yes [name], MD  ranitidine (ZANTAC) 150 MG tablet Take 150 mg by mouth 2 (two) times daily.   Yes Historical Provider, MD  albuterol (PROVENTIL HFA;VENTOLIN HFA) 108 (90 Base) MCG/ACT inhaler Inhale 1-2 puffs into the lungs every 4 (four) hours as needed for wheezing or shortness of breath. 07/29/16   Wendie Agreste, MD  doxycycline (VIBRAMYCIN) 100 MG capsule Take 1 capsule (100 mg total) by mouth 2 (two) times daily. 07/29/16   Dorie Rank, MD  tiotropium (SPIRIVA HANDIHALER) 18 MCG inhalation capsule Place 1 capsule (18 mcg total) into inhaler and inhale daily. 07/29/16   Wendie Agreste, MD    Family History Family History  Problem Relation Age of Onset  . Colon polyps Sister     x 2  . Crohn's disease Sister   . Stroke Father   . Hyperlipidemia Mother   . GER disease Mother   . Colon cancer Neg Hx     Social History Social History  Substance Use Topics  . Smoking status: Current Some Day Smoker    Years: 40.00    Types: Cigarettes  . Smokeless tobacco: Former Systems developer    Types: Chew  . Alcohol use 0.0 oz/week     Comment: 2-3 every 4-5 days      Allergies   Review of patient's allergies indicates no known allergies.   Review of Systems Review of Systems  Constitutional: Negative for fever.  HENT: Negative for ear pain and facial swelling.   Eyes: Negative for photophobia.  Respiratory: Negative for cough, choking and shortness of breath.   Cardiovascular: Negative for chest pain.  Gastrointestinal: Negative for vomiting.  Genitourinary: Negative for dysuria.  Musculoskeletal: Negative for back pain and joint swelling.  Skin: Positive for rash.  Neurological:  Negative for seizures and numbness.  Hematological: Positive for adenopathy.  Psychiatric/Behavioral: Negative for behavioral problems and confusion.  All other systems reviewed and are negative.    Physical Exam Updated Vital Signs BP 126/84 (BP Location: Left Arm)   Pulse 70   Temp 99.1 F (37.3 C) (Oral)   Resp 16   Ht 5\' 4"  (1.626 m)   Wt 72.1 kg   SpO2 100%   BMI 27.29 kg/m   Physical Exam  Constitutional: He appears well-developed and well-nourished. No distress.  HENT:  Head: Normocephalic and atraumatic.  Right Ear: External ear normal.  Left Ear: External ear normal.  Eyes: Conjunctivae are normal. Right eye exhibits no discharge. Left eye exhibits no discharge. No scleral icterus.  Neck: Neck supple.  No tracheal deviation present.  Cardiovascular: Normal rate, regular rhythm and intact distal pulses.   Pulmonary/Chest: Effort normal and breath sounds normal. No stridor. No respiratory distress. He has no wheezes. He has no rales.  Abdominal: Soft. Bowel sounds are normal. He exhibits no distension. There is no tenderness. There is no rebound and no guarding.  Musculoskeletal: He exhibits tenderness. He exhibits no edema.  Erythematous lesion on the dorsal aspect of the hand, lymphangitic streaking up to the axilla, axillary tenderness without fluctuance  Neurological: He is alert. He has normal strength. No cranial nerve deficit (no facial droop, extraocular movements intact, no slurred speech) or sensory deficit. He exhibits normal muscle tone. He displays no seizure activity. Coordination normal.  Skin: Skin is warm and dry. No rash noted.  Psychiatric: He has a normal mood and affect.  Nursing note and vitals reviewed.      ED Treatments / Results  Labs (all labs ordered are listed, but only abnormal results are displayed) Labs Reviewed  BASIC METABOLIC PANEL - Abnormal; Notable for the following:       Result Value   Creatinine, Ser 1.56 (*)    GFR calc  non Af Amer 46 (*)    GFR calc Af Amer 54 (*)    All other components within normal limits  CBC WITH DIFFERENTIAL/PLATELET  I-STAT CG4 LACTIC ACID, ED    Procedures Procedures (including critical care time)  Medications Ordered in ED Medications  cefTRIAXone (ROCEPHIN) 1 g in dextrose 5 % 50 mL IVPB (0 g Intravenous Stopped 07/29/16 1927)     Initial Impression / Assessment and Plan / ED Course  I have reviewed the triage vital signs and the nursing notes.  Pertinent labs & imaging results that were available during my care of the patient were reviewed by me and considered in my medical decision making (see chart for details).  Clinical Course   Labs are reassuring.  Pt does not have a fever.  Patient's exam is consistent with cellulitis associated with lymphangitic streaking. Patient does not want to be admitted to the hospital if he does not have to be. The patient is on medications that suppress his immune system. I explained to him that it is possible that despite the dose of IV antibiotics and the antibiotics I am going  to prescribe for him that his symptoms may worsen and he may need to be admitted to the hospital.  I instructed the patient to return to the emergency room if he has any worsening symptoms, fevers or other concerns. Otherwise he should follow-up with his doctor closely this coming week.  Final Clinical Impressions(s) / ED Diagnoses   Final diagnoses:  Cellulitis of right upper extremity  Lymphangitis    New Prescriptions New Prescriptions   DOXYCYCLINE (VIBRAMYCIN) 100 MG CAPSULE    Take 1 capsule (100 mg total) by mouth 2 (two) times daily.     Dorie Rank, MD 07/29/16 2005

## 2016-07-29 NOTE — ED Notes (Signed)
Bed: [id number] Expected date:  Expected time:  Means of arrival:  Comments: Hold for [place]

## 2016-07-29 NOTE — Patient Instructions (Addendum)
You can try Spiriva to see if it is less expensive than Advair.  If any worsening of your symptoms with just Spiriva, you can restart Advair. If needed, you could take both medications. I also wrote for short acting albuterol if needed for shortness of breath symptoms, but this should be followed up with lung specialist here or in [place] within in next month for possible repeat spirometry (lung function test).  Let me know where to refer you. If you require albuterol more than once per day, or any worsening of shortness of breath - return here or emergency room.   For Crohn's and abdominal hernias, I have provided a note out of work until specific recommendations can be made by your gastroenterologist regarding ability to return to work or expected outcomes/longevity of your symptoms with the Crohn's colitis and the impact of work on [employer]. If further paperwork needed, I will be happy to review it, but may need to have assessment with disability specialist.   For abdominal bloating, I would like you to call your gastroenterologist to see you and decide if CT scanning or other evaluation needed. Let them know about your symptoms. If you move to [place], your gastroenterologist may need to refer you to provider locally.   Return to the clinic or go to the nearest emergency room if any of your symptoms worsen or new symptoms occur.  I did not recheck kidney test here today, as no blood drawn in office, but this can be rechecked in ER to make sure it is not increasing. Follow up with me after your emergency room or hospital stay if needed for repeat kidney testing.  Unfortunately the redness on your arm looks like a lymphangitis. This can be a deeper infection, and can get worse without IV antibiotics. After leaving our office, go to [facility] emergency room for further evaluation and possible admission.  Chronic Obstructive Pulmonary Disease Chronic obstructive pulmonary disease (COPD) is a common  lung condition in which airflow from the lungs is limited. COPD is a general term that can be used to describe many different lung problems that limit airflow, including both chronic bronchitis and emphysema. If you have COPD, your lung function will probably never return to normal, but there are measures you can take to improve lung function and make yourself feel better. CAUSES   Smoking (common).  Exposure to secondhand smoke.  Genetic problems.  Chronic inflammatory lung diseases or recurrent infections. SYMPTOMS  Shortness of breath, especially with physical activity.  Deep, persistent (chronic) cough with a large amount of thick mucus.  Wheezing.  Rapid breaths (tachypnea).  Gray or bluish discoloration (cyanosis) of the skin, especially in your fingers, toes, or lips.  Fatigue.  Weight loss.  Frequent infections or episodes when breathing symptoms become much worse (exacerbations).  Chest tightness. DIAGNOSIS Your health care provider will take a medical history and perform a physical examination to diagnose COPD. Additional tests for COPD may include:  Lung (pulmonary) function tests.  Chest X-ray.  CT scan.  Blood tests. TREATMENT  Treatment for COPD may include:  Inhaler and nebulizer medicines. These help manage the symptoms of COPD and make your breathing more comfortable.  Supplemental oxygen. Supplemental oxygen is only helpful if you have a low oxygen level in your blood.  Exercise and physical activity. These are beneficial for nearly all people with COPD.  Lung surgery or transplant.  Nutrition therapy to gain weight, if you are underweight.  Pulmonary rehabilitation. This may  involve working with a team of health care providers and specialists, such as respiratory, occupational, and physical therapists. HOME CARE INSTRUCTIONS  Take all medicines (inhaled or pills) as directed by your health care provider.  Avoid over-the-counter medicines or  cough syrups that dry up your airway (such as antihistamines) and slow down the elimination of secretions unless instructed otherwise by your health care provider.  If you are a smoker, the most important thing that you can do is stop smoking. Continuing to smoke will cause further lung damage and breathing trouble. Ask your health care provider for help with quitting smoking. He or she can direct you to community resources or hospitals that provide support.  Avoid exposure to irritants such as smoke, chemicals, and fumes that aggravate your breathing.  Use oxygen therapy and pulmonary rehabilitation if directed by your health care provider. If you require home oxygen therapy, ask your health care provider whether you should purchase a pulse oximeter to measure your oxygen level at home.  Avoid contact with individuals who have a contagious illness.  Avoid extreme temperature and humidity changes.  Eat healthy foods. Eating smaller, more frequent meals and resting before meals may help you maintain your strength.  Stay active, but balance activity with periods of rest. Exercise and physical activity will help you maintain your ability to do things you want to do.  Preventing infection and hospitalization is very important when you have COPD. Make sure to receive all the vaccines your health care provider recommends, especially the pneumococcal and influenza vaccines. Ask your health care provider whether you need a pneumonia vaccine.  Learn and use relaxation techniques to manage stress.  Learn and use controlled breathing techniques as directed by your health care provider. Controlled breathing techniques include:  Pursed lip breathing. Start by breathing in (inhaling) through your nose for 1 second. Then, purse your lips as if you were going to whistle and breathe out (exhale) through the pursed lips for 2 seconds.  Diaphragmatic breathing. Start by putting one hand on your abdomen just  above your waist. Inhale slowly through your nose. The hand on your abdomen should move out. Then purse your lips and exhale slowly. You should be able to feel the hand on your abdomen moving in as you exhale.  Learn and use controlled coughing to clear mucus from your lungs. Controlled coughing is a series of short, progressive coughs. The steps of controlled coughing are: 1. Lean your head slightly forward. 2. Breathe in deeply using diaphragmatic breathing. 3. Try to hold your breath for 3 seconds. 4. Keep your mouth slightly open while coughing twice. 5. Spit any mucus out into a tissue. 6. Rest and repeat the steps once or twice as needed. SEEK MEDICAL CARE IF:  You are coughing up more mucus than usual.  There is a change in the color or thickness of your mucus.  Your breathing is more labored than usual.  Your breathing is faster than usual. SEEK IMMEDIATE MEDICAL CARE IF:  You have shortness of breath while you are resting.  You have shortness of breath that prevents you from:  Being able to talk.  Performing your usual physical activities.  You have chest pain lasting longer than 5 minutes.  Your skin color is more cyanotic than usual.  You measure low oxygen saturations for longer than 5 minutes with a pulse oximeter. MAKE SURE YOU:  Understand these instructions.  Will watch your condition.  Will get help right away if you  are not doing well or get worse.   This information is not intended to replace advice given to you by your health care provider. Make sure you discuss any questions you have with your health care provider.   Document Released: 08/25/2005 Document Revised: 12/06/2014 Document Reviewed: 07/12/2013 Elsevier Interactive Patient Education [employer].   IF you received an x-ray today, you will receive an invoice from [facility]. Please contact [facility] at [phone] with questions or concerns regarding your  invoice.   IF you received labwork today, you will receive an invoice from [employer]. Please contact [facility] at [phone] with questions or concerns regarding your invoice.   Our billing staff will not be able to assist you with questions regarding bills from these companies.  You will be contacted with the lab results as soon as they are available. The fastest way to get your results is to activate your My Chart account. Instructions are located on the last page of this paperwork. If you have not heard from [place] regarding the results in 2 weeks, please contact this office.

## 2016-07-29 NOTE — Discharge Instructions (Signed)
Return to the emergency room this weekend if you notice that the redness is increasing, you start developing fevers, or overall you feel your symptoms are getting worse. Otherwise, continue the antibiotics and follow-up with your doctor next week to be rechecked.

## 2016-08-05 ENCOUNTER — Ambulatory Visit: Payer: Commercial Managed Care - HMO | Admitting: Family Medicine

## 2016-08-05 ENCOUNTER — Encounter: Payer: Self-pay | Admitting: Family Medicine

## 2016-08-05 ENCOUNTER — Ambulatory Visit (INDEPENDENT_AMBULATORY_CARE_PROVIDER_SITE_OTHER): Payer: Commercial Managed Care - HMO | Admitting: Family Medicine

## 2016-08-05 VITALS — BP 120/58 | HR 76 | Temp 98.0°F | Ht 64.0 in | Wt 156.2 lb

## 2016-08-05 DIAGNOSIS — Z23 Encounter for immunization: Secondary | ICD-10-CM

## 2016-08-05 DIAGNOSIS — K50913 Crohn's disease, unspecified, with fistula: Secondary | ICD-10-CM | POA: Diagnosis not present

## 2016-08-05 DIAGNOSIS — L03124 Acute lymphangitis of left upper limb: Secondary | ICD-10-CM

## 2016-08-05 DIAGNOSIS — J449 Chronic obstructive pulmonary disease, unspecified: Secondary | ICD-10-CM | POA: Diagnosis not present

## 2016-08-05 NOTE — Progress Notes (Signed)
By signing my name below, I, Mesha Guinyard, attest that this documentation has been prepared under the direction and in the presence of Merri Ray.  Electronically Signed: Verlee Monte, Medical Scribe. 08/05/16. 2:31 PM.  Subjective:    Patient ID: Samuel Montgomery, male    DOB: 08-12-1956, 60 y.o.   MRN: [id number]  HPI Chief Complaint  Patient presents with  . Follow-up    BUG BITE  . Referral    GI and PCP in [place]    HPI Comments: Samuel Montgomery is a 60 y.o. male who presents to the [facility] for bug bite follow-up. Pt was last seen by me 8/31 for multiple concerns. PMHx of chron's disease with fistula. GI Dr. Renee Harder at [facility], but had not had an office visit in 1 year. Planned to move to [place]. He is unable to work due to his severity of his Chron's disease.  COPD: See last visit. Spiriva was ordered. Pt has been taking her   Cellulitis of right arm: See last visit. He was sent to the ED due to concerns for lymphangitis and setting of immune suppressant Humira. Note reviewed from the ER. He was given 1 g of Rocephin, option of hospitalization was discuss but he preferred out pt treatment with close follow-up. He was started on doxycyline 100 mg BID for 10 days.  Pt mentions the redness has continued to clear 2 days after taking abx and it's not as tender as before. Pt is compliant with doxycyline, and used hydrogen peroxide to clean his wound. Pt declines fever, and chills.  Referral: Pt is leaving for [place], [place] Sunday and is going to see a NP, who is the same provider that treated him for bronchitis in [place]. Pt would like to be seen at [facility] for his GI provider. Pt plans on switching his insurance to [employer].  Pt mentions he was given a tetanus shot earlier this year that wasn't covered and he had to pay $500 out of pocket for it.  Patient Active Problem List   Diagnosis Date Noted  . Long-term use of immunosuppressant medication  12/28/2014  . Peyronie's disease 04/15/2014  . Small bowel obstruction ([place]) 11/21/2012  . Nonspecific (abnormal) findings on radiological and other examination of gastrointestinal tract 07/02/2012  . 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 Cigarette smoker 05/02/2012  . Calcified granuloma of lung ([place]) 05/02/2012  . Alcohol dependence ([place]) 05/02/2012  . Crohn's ileocolitis ([place]) 04/05/2012  . Personal history of colonic polyps-rectal adenoma 03/30/2012  . Bilateral inguinal hernia 03/14/2012  . Psoriasis   . GERD (gastroesophageal reflux disease)    Past Medical History:  Diagnosis Date  . Adenomatous colon polyp 05/20/2005  . Adenomatous colon polyp 04/2005   colonoscopy by Dr Carlean Purl.   . Anemia   . Anxiety   . Arthritis   . COPD (chronic obstructive pulmonary disease) ([place])   . Crohn's ileocolitis ([place]) 04/05/2012   Diagnosed by colonoscopy 03/2012. ? Perianal fistula vs. Abscess    . Depression   . Diverticulosis of colon (without mention of hemorrhage)   . 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 GERD (gastroesophageal reflux disease)   . Inguinal hernia    bi-lateral  . Psoriasis   . Small bowel obstruction [facility])    Past Surgical History:  Procedure Laterality Date  . APPENDECTOMY  1979  . BOWEL RESECTION  05/14/2012   Procedure: SMALL BOWEL RESECTION;  Surgeon: Earnstine Regal, MD;  Location: [facility];  Service: General;  Laterality: N/A;  ileocecectomy  . COLONOSCOPY W/ BIOPSIES    . iliostomy    . INCISE AND DRAIN ABCESS    . INGUINAL HERNIA REPAIR     bilateral   . LAPAROTOMY  05/14/2012   Procedure: EXPLORATORY LAPAROTOMY;  Surgeon: Earnstine Regal, MD;  Location: [facility]S;  Service: General;  Laterality: N/A;  . VASECTOMY     No Known Allergies Prior to Admission medications   Medication Sig Start Date End Date Taking? Authorizing Provider  adalimumab (HUMIRA PEN) 40 MG/0.8ML injection Inject 40 mg sq every 2 weeks 08/22/12  Yes Gatha Mayer, MD  albuterol (PROVENTIL  HFA;VENTOLIN HFA) 108 (90 Base) MCG/ACT inhaler Inhale 1-2 puffs into the lungs every 4 (four) hours as needed for wheezing or shortness of breath. 07/29/16  Yes Wendie Agreste, MD  cholestyramine Lucrezia Starch) 4 g packet Take 4 g by mouth daily as needed (crohns symptoms).  02/10/16  Yes Historical Provider, MD  Cyanocobalamin (VITAMIN B-12 IJ) Inject 1 each as directed every 28 (twenty-eight) days.    Yes Historical Provider, MD  doxycycline (VIBRAMYCIN) 100 MG capsule Take 1 capsule (100 mg total) by mouth 2 (two) times daily. 07/29/16  Yes Dorie Rank, MD  Fluticasone-Salmeterol (ADVAIR) 100-50 MCG/DOSE AEPB Inhale 1 puff into the lungs 2 (two) times daily. 08/11/15  Yes [name], MD  hydrocortisone cream 1 % Apply 1 application topically 2 (two) times daily. To the perianal as needed up to 2 weeks Patient taking differently: Apply 1 application topically 2 (two) times daily as needed for itching. To the perianal as needed up to 2 weeks 07/12/16  Yes Wendie Agreste, MD  oxyCODONE-acetaminophen (PERCOCET/ROXICET) 5-325 MG per tablet Take 1 tablet by mouth every 4 (four) hours as needed. For pain Patient taking differently: Take 1 tablet by mouth every 4 (four) hours as needed for moderate pain.  08/11/15  Yes [name], MD  ranitidine (ZANTAC) 150 MG tablet Take 150 mg by mouth 2 (two) times daily.   Yes Historical Provider, MD  tiotropium (SPIRIVA HANDIHALER) 18 MCG inhalation capsule Place 1 capsule (18 mcg total) into inhaler and inhale daily. Patient not taking: Reported on 08/05/2016 07/29/16   Wendie Agreste, MD   Social History   Social History  . Marital status: Legally Separated    Spouse name: N/A  . Number of children: 2  . Years of education: N/A   Occupational History  . [employer]   .  [employer]   Social History Main Topics  . Smoking status: Current Some Day Smoker    Years: 40.00    Types: Cigarettes  . Smokeless tobacco: Former Systems developer    Types:  Chew  . Alcohol use 0.0 oz/week     Comment: 2-3 every 4-5 days   . Drug use:     Types: Marijuana     Comment: per patient uses pot monthly.  . Sexual activity: No   Other Topics Concern  . Not on file   Social History Narrative   Divorced; Lives alone    2 children.   Disabled.   Depression screen [facility] 2/9 08/05/2016 07/29/2016 07/12/2016 08/11/2015 04/01/2014  Decreased Interest - 0 0 0 0  Down, Depressed, Hopeless 0 0 0 0 0  PHQ - 2 Score 0 0 0 0 0   Review of Systems  Constitutional: Negative for chills and fever.  Skin: Positive for wound (healing bite). Negative for color  change and rash.   Objective:  Physical Exam  Constitutional: He appears well-developed and well-nourished. No distress.  HENT:  Head: Normocephalic and atraumatic.  Eyes: Conjunctivae are normal.  Neck: Neck supple.  Cardiovascular: Normal rate, regular rhythm and normal heart sounds.  Exam reveals no friction rub.   No murmur heard. Pulmonary/Chest: Effort normal. No respiratory distress. He has no wheezes. He has no rales.  Musculoskeletal:  Minimal discomfort of the right upper arm, no erythema  Neurological: He is alert.  Skin: Skin is warm and dry.  Healing wound on the dorsum side of right hand approx 8 mm across. No surrounding erythema, no red streaking going across wound  Psychiatric: He has a normal mood and affect. His behavior is normal.  Nursing note and vitals reviewed.  BP (!) 120/58 (BP Location: Left Arm, Patient Position: Sitting, Cuff Size: Normal)   Pulse 76   Temp 98 F (36.7 C) (Oral)   Ht 5\' 4"  (1.626 m)   Wt 156 lb 3.2 oz (70.9 kg)   BMI 26.81 kg/m  Assessment & Plan:   Samuel Montgomery is a 60 y.o. male Need for prophylactic vaccination and inoculation against influenza - Plan: Flu Vaccine QUAD 36+ mos IM  Chronic obstructive pulmonary disease, unspecified COPD type ([place])  - start spiriva, and return to Advair if it works better. Follow up with PCP in [place].  Let me  know if referral needed to specialist.   Acute lymphangitis of left upper extremity  - improving. Continue course of ABX. RTC/ER precautions if return of erythema up arm.   Crohn disease, with fistula  - plan to follow up with specialist in [place]. Advised to discuss referral or names of providers with his current gastroenterologist at [facility], but to let me know if referral needed.   No orders of the defined types were placed in this encounter.  Patient Instructions   I'm glad to see the infection in your arm is improving. Complete the antibiotic, then if any increased redness or swelling around the wound, be seen immediately. If you have redness spreading up the arm again, go to the emergency room.  Spiriva or Advair for COPD, and follow-up with primary care provider in [place]. If they need records from here, please have them call us.  For ongoing care for Crohn's disease, would recommend you discuss this with your current gastroenterologist as they may have a reference for you at [facility]. If a referral is needed from our office to that provider at [facility], please let me know.  Return to the clinic or go to the nearest emergency room if any of your symptoms worsen or new symptoms occur.  Good luck in [place].   IF you received an x-ray today, you will receive an invoice from [facility]. Please contact [facility] at [phone] with questions or concerns regarding your invoice.   IF you received labwork today, you will receive an invoice from [employer]. Please contact [facility] at [phone] with questions or concerns regarding your invoice.   Our billing staff will not be able to assist you with questions regarding bills from these companies.  You will be contacted with the lab results as soon as they are available. The fastest way to get your results is to activate your My Chart account. Instructions are located on the last  page of this paperwork. If you have not heard from [place] regarding the results in 2 weeks, please contact this office.  I personally performed the services described in this documentation, which was scribed in my presence. The recorded information has been reviewed and considered, and addended by me as needed.   Signed,   Merri Ray, MD [facility].  08/07/16 10:31 PM  nt}

## 2016-08-05 NOTE — Patient Instructions (Addendum)
I'm glad to see the infection in your arm is improving. Complete the antibiotic, then if any increased redness or swelling around the wound, be seen immediately. If you have redness spreading up the arm again, go to the emergency room.  Spiriva or Advair for COPD, and follow-up with primary care provider in [place]. If they need records from here, please have them call us.  For ongoing care for Crohn's disease, would recommend you discuss this with your current gastroenterologist as they may have a reference for you at [facility]. If a referral is needed from our office to that provider at [facility], please let me know.  Return to the clinic or go to the nearest emergency room if any of your symptoms worsen or new symptoms occur.  Good luck in [place].   IF you received an x-ray today, you will receive an invoice from [facility]. Please contact [facility] at [phone] with questions or concerns regarding your invoice.   IF you received labwork today, you will receive an invoice from [employer]. Please contact [facility] at [phone] with questions or concerns regarding your invoice.   Our billing staff will not be able to assist you with questions regarding bills from these companies.  You will be contacted with the lab results as soon as they are available. The fastest way to get your results is to activate your My Chart account. Instructions are located on the last page of this paperwork. If you have not heard from [place] regarding the results in 2 weeks, please contact this office.

## 2019-11-02 ENCOUNTER — Other Ambulatory Visit: Payer: Self-pay

## 2019-11-02 DIAGNOSIS — Z20822 Contact with and (suspected) exposure to covid-19: Secondary | ICD-10-CM

## 2019-11-05 LAB — NOVEL CORONAVIRUS, NAA: SARS-CoV-2, NAA: NOT DETECTED
